# Patient Record
Sex: Female | Born: 1985 | Race: Black or African American | Hispanic: No | Smoking: Current every day smoker
Health system: Southern US, Community
[De-identification: ages and names within clinical notes are randomized; demographics above are authoritative.]

## PROBLEM LIST (undated history)

## (undated) ENCOUNTER — Inpatient Hospital Stay (HOSPITAL_COMMUNITY): Payer: Self-pay

## (undated) DIAGNOSIS — M1990 Unspecified osteoarthritis, unspecified site: Secondary | ICD-10-CM

## (undated) DIAGNOSIS — N390 Urinary tract infection, site not specified: Secondary | ICD-10-CM

## (undated) DIAGNOSIS — H209 Unspecified iridocyclitis: Secondary | ICD-10-CM

## (undated) DIAGNOSIS — F32A Depression, unspecified: Secondary | ICD-10-CM

## (undated) DIAGNOSIS — F419 Anxiety disorder, unspecified: Secondary | ICD-10-CM

## (undated) DIAGNOSIS — I10 Essential (primary) hypertension: Secondary | ICD-10-CM

## (undated) DIAGNOSIS — F329 Major depressive disorder, single episode, unspecified: Secondary | ICD-10-CM

## (undated) DIAGNOSIS — Z789 Other specified health status: Secondary | ICD-10-CM

## (undated) DIAGNOSIS — J45909 Unspecified asthma, uncomplicated: Secondary | ICD-10-CM

## (undated) DIAGNOSIS — E119 Type 2 diabetes mellitus without complications: Secondary | ICD-10-CM

## (undated) HISTORY — PX: PILONIDAL CYST EXCISION: SHX744

## (undated) HISTORY — PX: TUBAL LIGATION: SHX77

## (undated) HISTORY — PX: WISDOM TOOTH EXTRACTION: SHX21

## (undated) HISTORY — DX: Unspecified iridocyclitis: H20.9

## (undated) HISTORY — DX: Anxiety disorder, unspecified: F41.9

## (undated) HISTORY — DX: Major depressive disorder, single episode, unspecified: F32.9

## (undated) HISTORY — DX: Essential (primary) hypertension: I10

## (undated) HISTORY — DX: Other specified health status: Z78.9

## (undated) HISTORY — PX: CHOLECYSTECTOMY: SHX55

## (undated) HISTORY — DX: Depression, unspecified: F32.A

## (undated) HISTORY — PX: CYSTECTOMY: SUR359

## (undated) HISTORY — PX: EYE SURGERY: SHX253

---

## 2004-01-11 ENCOUNTER — Emergency Department (HOSPITAL_COMMUNITY): Admission: EM | Admit: 2004-01-11 | Discharge: 2004-01-12 | Payer: Self-pay | Admitting: Emergency Medicine

## 2005-04-21 ENCOUNTER — Emergency Department (HOSPITAL_COMMUNITY): Admission: EM | Admit: 2005-04-21 | Discharge: 2005-04-21 | Payer: Self-pay | Admitting: Emergency Medicine

## 2005-06-17 ENCOUNTER — Inpatient Hospital Stay (HOSPITAL_COMMUNITY): Admission: AD | Admit: 2005-06-17 | Discharge: 2005-06-17 | Payer: Self-pay | Admitting: *Deleted

## 2005-07-05 ENCOUNTER — Ambulatory Visit (HOSPITAL_COMMUNITY): Admission: RE | Admit: 2005-07-05 | Discharge: 2005-07-05 | Payer: Self-pay | Admitting: *Deleted

## 2005-07-30 ENCOUNTER — Inpatient Hospital Stay (HOSPITAL_COMMUNITY): Admission: AD | Admit: 2005-07-30 | Discharge: 2005-07-30 | Payer: Self-pay | Admitting: Obstetrics and Gynecology

## 2005-08-02 ENCOUNTER — Ambulatory Visit (HOSPITAL_COMMUNITY): Admission: RE | Admit: 2005-08-02 | Discharge: 2005-08-02 | Payer: Self-pay | Admitting: *Deleted

## 2005-08-19 ENCOUNTER — Ambulatory Visit: Payer: Self-pay | Admitting: *Deleted

## 2005-08-19 ENCOUNTER — Inpatient Hospital Stay (HOSPITAL_COMMUNITY): Admission: AD | Admit: 2005-08-19 | Discharge: 2005-08-19 | Payer: Self-pay | Admitting: Obstetrics & Gynecology

## 2005-11-05 ENCOUNTER — Inpatient Hospital Stay (HOSPITAL_COMMUNITY): Admission: AD | Admit: 2005-11-05 | Discharge: 2005-11-05 | Payer: Self-pay | Admitting: Family Medicine

## 2005-12-21 ENCOUNTER — Inpatient Hospital Stay (HOSPITAL_COMMUNITY): Admission: AD | Admit: 2005-12-21 | Discharge: 2005-12-21 | Payer: Self-pay | Admitting: Obstetrics & Gynecology

## 2006-01-01 ENCOUNTER — Inpatient Hospital Stay (HOSPITAL_COMMUNITY): Admission: AD | Admit: 2006-01-01 | Discharge: 2006-01-04 | Payer: Self-pay | Admitting: Obstetrics

## 2006-01-02 ENCOUNTER — Encounter (INDEPENDENT_AMBULATORY_CARE_PROVIDER_SITE_OTHER): Payer: Self-pay | Admitting: Specialist

## 2007-02-03 ENCOUNTER — Emergency Department (HOSPITAL_COMMUNITY): Admission: EM | Admit: 2007-02-03 | Discharge: 2007-02-03 | Payer: Self-pay | Admitting: Family Medicine

## 2007-02-06 ENCOUNTER — Emergency Department (HOSPITAL_COMMUNITY): Admission: EM | Admit: 2007-02-06 | Discharge: 2007-02-06 | Payer: Self-pay | Admitting: Emergency Medicine

## 2007-03-24 ENCOUNTER — Emergency Department (HOSPITAL_COMMUNITY): Admission: EM | Admit: 2007-03-24 | Discharge: 2007-03-24 | Payer: Self-pay | Admitting: Emergency Medicine

## 2007-03-27 ENCOUNTER — Emergency Department (HOSPITAL_COMMUNITY): Admission: EM | Admit: 2007-03-27 | Discharge: 2007-03-27 | Payer: Self-pay | Admitting: Emergency Medicine

## 2007-10-05 ENCOUNTER — Emergency Department (HOSPITAL_COMMUNITY): Admission: EM | Admit: 2007-10-05 | Discharge: 2007-10-05 | Payer: Self-pay | Admitting: Family Medicine

## 2007-10-07 ENCOUNTER — Emergency Department (HOSPITAL_COMMUNITY): Admission: EM | Admit: 2007-10-07 | Discharge: 2007-10-07 | Payer: Self-pay | Admitting: Emergency Medicine

## 2007-10-24 ENCOUNTER — Ambulatory Visit (HOSPITAL_BASED_OUTPATIENT_CLINIC_OR_DEPARTMENT_OTHER): Admission: RE | Admit: 2007-10-24 | Discharge: 2007-10-24 | Payer: Self-pay | Admitting: Surgery

## 2007-10-24 ENCOUNTER — Encounter (INDEPENDENT_AMBULATORY_CARE_PROVIDER_SITE_OTHER): Payer: Self-pay | Admitting: Surgery

## 2007-10-26 ENCOUNTER — Emergency Department (HOSPITAL_COMMUNITY): Admission: EM | Admit: 2007-10-26 | Discharge: 2007-10-26 | Payer: Self-pay | Admitting: Emergency Medicine

## 2008-03-05 ENCOUNTER — Emergency Department (HOSPITAL_COMMUNITY): Admission: EM | Admit: 2008-03-05 | Discharge: 2008-03-05 | Payer: Self-pay | Admitting: Family Medicine

## 2009-03-15 ENCOUNTER — Inpatient Hospital Stay (HOSPITAL_COMMUNITY): Admission: AD | Admit: 2009-03-15 | Discharge: 2009-03-15 | Payer: Self-pay | Admitting: Obstetrics & Gynecology

## 2009-04-16 ENCOUNTER — Inpatient Hospital Stay (HOSPITAL_COMMUNITY): Admission: AD | Admit: 2009-04-16 | Discharge: 2009-04-16 | Payer: Self-pay | Admitting: Obstetrics

## 2009-05-29 ENCOUNTER — Emergency Department (HOSPITAL_COMMUNITY): Admission: EM | Admit: 2009-05-29 | Discharge: 2009-05-29 | Payer: Self-pay | Admitting: Emergency Medicine

## 2009-06-02 ENCOUNTER — Ambulatory Visit (HOSPITAL_COMMUNITY): Admission: RE | Admit: 2009-06-02 | Discharge: 2009-06-02 | Payer: Self-pay | Admitting: Obstetrics

## 2009-06-18 ENCOUNTER — Inpatient Hospital Stay (HOSPITAL_COMMUNITY): Admission: AD | Admit: 2009-06-18 | Discharge: 2009-06-18 | Payer: Self-pay | Admitting: Obstetrics

## 2009-07-25 ENCOUNTER — Inpatient Hospital Stay (HOSPITAL_COMMUNITY): Admission: AD | Admit: 2009-07-25 | Discharge: 2009-07-25 | Payer: Self-pay | Admitting: Obstetrics

## 2009-09-14 ENCOUNTER — Emergency Department (HOSPITAL_COMMUNITY): Admission: EM | Admit: 2009-09-14 | Discharge: 2009-09-14 | Payer: Self-pay | Admitting: Family Medicine

## 2009-10-20 ENCOUNTER — Inpatient Hospital Stay (HOSPITAL_COMMUNITY): Admission: AD | Admit: 2009-10-20 | Discharge: 2009-10-20 | Payer: Self-pay | Admitting: Obstetrics & Gynecology

## 2009-10-29 ENCOUNTER — Inpatient Hospital Stay (HOSPITAL_COMMUNITY): Admission: RE | Admit: 2009-10-29 | Discharge: 2009-10-31 | Payer: Self-pay | Admitting: Obstetrics

## 2009-11-09 ENCOUNTER — Inpatient Hospital Stay (HOSPITAL_COMMUNITY): Admission: AD | Admit: 2009-11-09 | Discharge: 2009-11-10 | Payer: Self-pay | Admitting: Family Medicine

## 2009-11-14 ENCOUNTER — Emergency Department (HOSPITAL_COMMUNITY): Admission: EM | Admit: 2009-11-14 | Discharge: 2009-11-14 | Payer: Self-pay | Admitting: Internal Medicine

## 2009-11-14 ENCOUNTER — Emergency Department (HOSPITAL_COMMUNITY): Admission: EM | Admit: 2009-11-14 | Discharge: 2009-11-14 | Payer: Self-pay | Admitting: Emergency Medicine

## 2010-03-04 ENCOUNTER — Emergency Department (HOSPITAL_COMMUNITY): Admission: EM | Admit: 2010-03-04 | Discharge: 2010-03-04 | Payer: Self-pay | Admitting: Emergency Medicine

## 2010-03-08 ENCOUNTER — Emergency Department (HOSPITAL_COMMUNITY): Admission: EM | Admit: 2010-03-08 | Discharge: 2010-03-08 | Payer: Self-pay | Admitting: Emergency Medicine

## 2010-04-21 ENCOUNTER — Ambulatory Visit (HOSPITAL_COMMUNITY): Admission: RE | Admit: 2010-04-21 | Discharge: 2010-04-21 | Payer: Self-pay | Admitting: Surgery

## 2010-05-27 ENCOUNTER — Encounter: Admission: RE | Admit: 2010-05-27 | Discharge: 2010-05-27 | Payer: Self-pay | Admitting: Surgery

## 2010-08-08 ENCOUNTER — Encounter: Payer: Self-pay | Admitting: Obstetrics

## 2010-09-23 ENCOUNTER — Emergency Department (HOSPITAL_COMMUNITY)
Admission: EM | Admit: 2010-09-23 | Discharge: 2010-09-23 | Disposition: A | Discharge status: Home / Self Care | Payer: Medicaid Other | Attending: Emergency Medicine | Admitting: Emergency Medicine

## 2010-09-23 DIAGNOSIS — L0501 Pilonidal cyst with abscess: Secondary | ICD-10-CM | POA: Insufficient documentation

## 2010-09-30 LAB — SURGICAL PCR SCREEN
MRSA, PCR: NEGATIVE
Staphylococcus aureus: NEGATIVE

## 2010-09-30 LAB — CBC
HCT: 37.4 % (ref 36.0–46.0)
Hemoglobin: 12.7 g/dL (ref 12.0–15.0)
MCH: 30.2 pg (ref 26.0–34.0)
MCHC: 34 g/dL (ref 30.0–36.0)
MCV: 89 fL (ref 78.0–100.0)
Platelets: 328 10*3/uL (ref 150–400)
RBC: 4.2 MIL/uL (ref 3.87–5.11)
RDW: 13.6 % (ref 11.5–15.5)
WBC: 11.7 10*3/uL — ABNORMAL HIGH (ref 4.0–10.5)

## 2010-10-03 ENCOUNTER — Emergency Department (HOSPITAL_COMMUNITY)
Admission: EM | Admit: 2010-10-03 | Discharge: 2010-10-04 | Disposition: A | Discharge status: Home / Self Care | Payer: Medicaid Other | Attending: Emergency Medicine | Admitting: Emergency Medicine

## 2010-10-03 DIAGNOSIS — R109 Unspecified abdominal pain: Secondary | ICD-10-CM | POA: Insufficient documentation

## 2010-10-03 DIAGNOSIS — K5289 Other specified noninfective gastroenteritis and colitis: Secondary | ICD-10-CM | POA: Insufficient documentation

## 2010-10-03 DIAGNOSIS — R197 Diarrhea, unspecified: Secondary | ICD-10-CM | POA: Insufficient documentation

## 2010-10-03 DIAGNOSIS — R10819 Abdominal tenderness, unspecified site: Secondary | ICD-10-CM | POA: Insufficient documentation

## 2010-10-03 DIAGNOSIS — E860 Dehydration: Secondary | ICD-10-CM | POA: Insufficient documentation

## 2010-10-03 DIAGNOSIS — R112 Nausea with vomiting, unspecified: Secondary | ICD-10-CM | POA: Insufficient documentation

## 2010-10-03 LAB — DIFFERENTIAL
Basophils Absolute: 0 10*3/uL (ref 0.0–0.1)
Basophils Relative: 0 % (ref 0–1)
Eosinophils Absolute: 0.2 10*3/uL (ref 0.0–0.7)
Eosinophils Relative: 2 % (ref 0–5)
Lymphocytes Relative: 19 % (ref 12–46)
Lymphs Abs: 1.9 10*3/uL (ref 0.7–4.0)
Monocytes Absolute: 0.9 10*3/uL (ref 0.1–1.0)
Monocytes Relative: 9 % (ref 3–12)
Neutro Abs: 7.1 10*3/uL (ref 1.7–7.7)
Neutrophils Relative %: 70 % (ref 43–77)

## 2010-10-03 LAB — URINALYSIS, ROUTINE W REFLEX MICROSCOPIC
Bilirubin Urine: NEGATIVE
Glucose, UA: NEGATIVE mg/dL
Hgb urine dipstick: NEGATIVE
Ketones, ur: NEGATIVE mg/dL
Nitrite: NEGATIVE
Protein, ur: NEGATIVE mg/dL
Specific Gravity, Urine: 1.015 (ref 1.005–1.030)
Urobilinogen, UA: 0.2 mg/dL (ref 0.0–1.0)
pH: 7.5 (ref 5.0–8.0)

## 2010-10-03 LAB — GC/CHLAMYDIA PROBE AMP, GENITAL
Chlamydia, DNA Probe: NEGATIVE
GC Probe Amp, Genital: NEGATIVE

## 2010-10-03 LAB — BASIC METABOLIC PANEL
BUN: 6 mg/dL (ref 6–23)
CO2: 23 mEq/L (ref 19–32)
Calcium: 8 mg/dL — ABNORMAL LOW (ref 8.4–10.5)
Chloride: 107 mEq/L (ref 96–112)
Creatinine, Ser: 0.62 mg/dL (ref 0.4–1.2)
GFR calc Af Amer: >60 mL/min (ref >60)
GFR calc non Af Amer: >60 mL/min (ref >60)
Glucose, Bld: 97 mg/dL (ref 70–99)
Potassium: 3.1 mEq/L — ABNORMAL LOW (ref 3.5–5.1)
Sodium: 134 mEq/L — ABNORMAL LOW (ref 135–145)

## 2010-10-03 LAB — CBC
HCT: 36.7 % (ref 36.0–46.0)
Hemoglobin: 12.6 g/dL (ref 12.0–15.0)
MCH: 30.4 pg (ref 26.0–34.0)
MCHC: 34.3 g/dL (ref 30.0–36.0)
MCV: 88.4 fL (ref 78.0–100.0)
Platelets: 294 10*3/uL (ref 150–400)
RBC: 4.15 MIL/uL (ref 3.87–5.11)
RDW: 13.9 % (ref 11.5–15.5)
WBC: 10.1 10*3/uL (ref 4.0–10.5)

## 2010-10-03 LAB — WET PREP, GENITAL
Trich, Wet Prep: NONE SEEN
Yeast Wet Prep HPF POC: NONE SEEN

## 2010-10-03 LAB — FETAL FIBRONECTIN: Fetal Fibronectin: NEGATIVE

## 2010-10-05 LAB — AMYLASE: Amylase: 51 U/L (ref 0–105)

## 2010-10-05 LAB — CBC
HCT: 27.4 % — ABNORMAL LOW (ref 36.0–46.0)
HCT: 38.2 % (ref 36.0–46.0)
Hemoglobin: 12.6 g/dL (ref 12.0–15.0)
Hemoglobin: 9.2 g/dL — ABNORMAL LOW (ref 12.0–15.0)
MCHC: 32.9 g/dL (ref 30.0–36.0)
MCHC: 33.5 g/dL (ref 30.0–36.0)
MCV: 89.8 fL (ref 78.0–100.0)
MCV: 91.5 fL (ref 78.0–100.0)
Platelets: 166 10*3/uL (ref 150–400)
Platelets: 349 10*3/uL (ref 150–400)
RBC: 3 MIL/uL — ABNORMAL LOW (ref 3.87–5.11)
RBC: 4.25 MIL/uL (ref 3.87–5.11)
RDW: 14.2 % (ref 11.5–15.5)
RDW: 14.6 % (ref 11.5–15.5)
WBC: 14.4 10*3/uL — ABNORMAL HIGH (ref 4.0–10.5)
WBC: 15.8 10*3/uL — ABNORMAL HIGH (ref 4.0–10.5)

## 2010-10-05 LAB — DIFFERENTIAL
Basophils Absolute: 0.1 10*3/uL (ref 0.0–0.1)
Basophils Relative: 0 % (ref 0–1)
Eosinophils Absolute: 0.1 10*3/uL (ref 0.0–0.7)
Eosinophils Relative: 1 % (ref 0–5)
Lymphocytes Relative: 17 % (ref 12–46)
Lymphs Abs: 2.4 10*3/uL (ref 0.7–4.0)
Monocytes Absolute: 0.8 10*3/uL (ref 0.1–1.0)
Monocytes Relative: 6 % (ref 3–12)
Neutro Abs: 11 10*3/uL — ABNORMAL HIGH (ref 1.7–7.7)
Neutrophils Relative %: 76 % (ref 43–77)

## 2010-10-05 LAB — COMPREHENSIVE METABOLIC PANEL
ALT: 15 U/L (ref 0–35)
AST: 23 U/L (ref 0–37)
Albumin: 3.8 g/dL (ref 3.5–5.2)
Alkaline Phosphatase: 113 U/L (ref 39–117)
BUN: 8 mg/dL (ref 6–23)
CO2: 24 mEq/L (ref 19–32)
Calcium: 9.4 mg/dL (ref 8.4–10.5)
Chloride: 104 mEq/L (ref 96–112)
Creatinine, Ser: 0.68 mg/dL (ref 0.4–1.2)
GFR calc Af Amer: >60 mL/min (ref >60)
GFR calc non Af Amer: >60 mL/min (ref >60)
Glucose, Bld: 99 mg/dL (ref 70–99)
Potassium: 3.4 mEq/L — ABNORMAL LOW (ref 3.5–5.1)
Sodium: 139 mEq/L (ref 135–145)
Total Bilirubin: 0.6 mg/dL (ref 0.3–1.2)
Total Protein: 7.3 g/dL (ref 6.0–8.3)

## 2010-10-05 LAB — URINALYSIS, ROUTINE W REFLEX MICROSCOPIC
Bilirubin Urine: NEGATIVE
Glucose, UA: NEGATIVE mg/dL
Hgb urine dipstick: NEGATIVE
Ketones, ur: NEGATIVE mg/dL
Nitrite: NEGATIVE
Protein, ur: NEGATIVE mg/dL
Specific Gravity, Urine: <1.005 — ABNORMAL LOW (ref 1.005–1.030)
Urobilinogen, UA: 0.2 mg/dL (ref 0.0–1.0)
pH: 7 (ref 5.0–8.0)

## 2010-10-05 LAB — TROPONIN I: Troponin I: 0.03 ng/mL (ref 0.00–0.06)

## 2010-10-05 LAB — URIC ACID: Uric Acid, Serum: 5.9 mg/dL (ref 2.4–7.0)

## 2010-10-05 LAB — LIPASE, BLOOD: Lipase: 23 U/L (ref 11–59)

## 2010-10-05 LAB — LACTATE DEHYDROGENASE: LDH: 251 U/L — ABNORMAL HIGH (ref 94–250)

## 2010-10-06 LAB — CBC
HCT: 31.2 % — ABNORMAL LOW (ref 36.0–46.0)
Hemoglobin: 10.4 g/dL — ABNORMAL LOW (ref 12.0–15.0)
MCHC: 33.5 g/dL (ref 30.0–36.0)
MCV: 90.8 fL (ref 78.0–100.0)
Platelets: 203 10*3/uL (ref 150–400)
RBC: 3.43 MIL/uL — ABNORMAL LOW (ref 3.87–5.11)
RDW: 14.5 % (ref 11.5–15.5)
WBC: 12.5 10*3/uL — ABNORMAL HIGH (ref 4.0–10.5)

## 2010-10-06 LAB — RPR: RPR Ser Ql: NONREACTIVE

## 2010-10-20 LAB — POCT RAPID STREP A (OFFICE): Streptococcus, Group A Screen (Direct): POSITIVE — AB

## 2010-10-22 LAB — CBC
HCT: 34.1 % — ABNORMAL LOW (ref 36.0–46.0)
Hemoglobin: 11.6 g/dL — ABNORMAL LOW (ref 12.0–15.0)
MCHC: 33.8 g/dL (ref 30.0–36.0)
MCV: 93 fL (ref 78.0–100.0)
Platelets: 280 10*3/uL (ref 150–400)
RBC: 3.67 MIL/uL — ABNORMAL LOW (ref 3.87–5.11)
RDW: 13.4 % (ref 11.5–15.5)
WBC: 12.5 10*3/uL — ABNORMAL HIGH (ref 4.0–10.5)

## 2010-10-22 LAB — BASIC METABOLIC PANEL
BUN: 4 mg/dL — ABNORMAL LOW (ref 6–23)
CO2: 22 mEq/L (ref 19–32)
Calcium: 9.1 mg/dL (ref 8.4–10.5)
Chloride: 104 mEq/L (ref 96–112)
Creatinine, Ser: 0.54 mg/dL (ref 0.4–1.2)
GFR calc Af Amer: >60 mL/min (ref >60)
GFR calc non Af Amer: >60 mL/min (ref >60)
Glucose, Bld: 119 mg/dL — ABNORMAL HIGH (ref 70–99)
Potassium: 3.4 mEq/L — ABNORMAL LOW (ref 3.5–5.1)
Sodium: 134 mEq/L — ABNORMAL LOW (ref 135–145)

## 2010-10-22 LAB — DIFFERENTIAL
Basophils Absolute: 0 10*3/uL (ref 0.0–0.1)
Basophils Relative: 0 % (ref 0–1)
Eosinophils Absolute: 0.1 10*3/uL (ref 0.0–0.7)
Eosinophils Relative: 1 % (ref 0–5)
Lymphocytes Relative: 21 % (ref 12–46)
Lymphs Abs: 2.6 10*3/uL (ref 0.7–4.0)
Monocytes Absolute: 0.8 10*3/uL (ref 0.1–1.0)
Monocytes Relative: 7 % (ref 3–12)
Neutro Abs: 8.9 10*3/uL — ABNORMAL HIGH (ref 1.7–7.7)
Neutrophils Relative %: 71 % (ref 43–77)

## 2010-10-23 LAB — URINALYSIS, ROUTINE W REFLEX MICROSCOPIC
Bilirubin Urine: NEGATIVE
Glucose, UA: NEGATIVE mg/dL
Hgb urine dipstick: NEGATIVE
Ketones, ur: NEGATIVE mg/dL
Nitrite: NEGATIVE
Protein, ur: NEGATIVE mg/dL
Specific Gravity, Urine: 1.01 (ref 1.005–1.030)
Urobilinogen, UA: 0.2 mg/dL (ref 0.0–1.0)
pH: 7.5 (ref 5.0–8.0)

## 2010-10-23 LAB — CBC
HCT: 36.5 % (ref 36.0–46.0)
Hemoglobin: 12.2 g/dL (ref 12.0–15.0)
MCHC: 33.5 g/dL (ref 30.0–36.0)
MCV: 93.9 fL (ref 78.0–100.0)
Platelets: 301 10*3/uL (ref 150–400)
RBC: 3.89 MIL/uL (ref 3.87–5.11)
RDW: 13.4 % (ref 11.5–15.5)
WBC: 13.7 10*3/uL — ABNORMAL HIGH (ref 4.0–10.5)

## 2010-10-23 LAB — COMPREHENSIVE METABOLIC PANEL
ALT: 15 U/L (ref 0–35)
AST: 20 U/L (ref 0–37)
Albumin: 3.8 g/dL (ref 3.5–5.2)
Alkaline Phosphatase: 67 U/L (ref 39–117)
BUN: 4 mg/dL — ABNORMAL LOW (ref 6–23)
CO2: 23 mEq/L (ref 19–32)
Calcium: 8.7 mg/dL (ref 8.4–10.5)
Chloride: 103 mEq/L (ref 96–112)
Creatinine, Ser: 0.64 mg/dL (ref 0.4–1.2)
GFR calc Af Amer: >60 mL/min (ref >60)
GFR calc non Af Amer: >60 mL/min (ref >60)
Glucose, Bld: 97 mg/dL (ref 70–99)
Potassium: 3.6 mEq/L (ref 3.5–5.1)
Sodium: 134 mEq/L — ABNORMAL LOW (ref 135–145)
Total Bilirubin: 0.4 mg/dL (ref 0.3–1.2)
Total Protein: 7.3 g/dL (ref 6.0–8.3)

## 2010-11-04 ENCOUNTER — Other Ambulatory Visit: Payer: Self-pay | Admitting: Surgery

## 2010-11-04 DIAGNOSIS — M533 Sacrococcygeal disorders, not elsewhere classified: Secondary | ICD-10-CM

## 2010-11-08 ENCOUNTER — Other Ambulatory Visit: Payer: Medicaid Other

## 2010-11-11 ENCOUNTER — Ambulatory Visit
Admission: RE | Admit: 2010-11-11 | Discharge: 2010-11-11 | Disposition: A | Discharge status: Home / Self Care | Payer: Medicaid Other | Source: Ambulatory Visit | Attending: Surgery | Admitting: Surgery

## 2010-11-11 DIAGNOSIS — M533 Sacrococcygeal disorders, not elsewhere classified: Secondary | ICD-10-CM

## 2010-11-11 MED ORDER — GADOBENATE DIMEGLUMINE 529 MG/ML IV SOLN
20.0000 mL | Freq: Once | INTRAVENOUS | Status: AC | PRN
  Administered 2010-11-11: 20 mL via INTRAVENOUS

## 2010-11-17 ENCOUNTER — Encounter (HOSPITAL_COMMUNITY)
Admission: RE | Admit: 2010-11-17 | Discharge: 2010-11-17 | Disposition: A | Discharge status: Home / Self Care | Payer: Medicaid Other | Source: Ambulatory Visit | Attending: Surgery | Admitting: Surgery

## 2010-11-17 ENCOUNTER — Other Ambulatory Visit (HOSPITAL_COMMUNITY): Payer: Self-pay | Admitting: Surgery

## 2010-11-17 ENCOUNTER — Ambulatory Visit (HOSPITAL_COMMUNITY)
Admission: RE | Admit: 2010-11-17 | Discharge: 2010-11-17 | Disposition: A | Discharge status: Home / Self Care | Payer: Medicaid Other | Source: Ambulatory Visit | Attending: Surgery | Admitting: Surgery

## 2010-11-17 DIAGNOSIS — Z01812 Encounter for preprocedural laboratory examination: Secondary | ICD-10-CM | POA: Insufficient documentation

## 2010-11-17 DIAGNOSIS — L0501 Pilonidal cyst with abscess: Secondary | ICD-10-CM

## 2010-11-17 DIAGNOSIS — Z01810 Encounter for preprocedural cardiovascular examination: Secondary | ICD-10-CM | POA: Insufficient documentation

## 2010-11-17 DIAGNOSIS — Z01818 Encounter for other preprocedural examination: Secondary | ICD-10-CM | POA: Insufficient documentation

## 2010-11-17 LAB — BASIC METABOLIC PANEL
BUN: 8 mg/dL (ref 6–23)
CO2: 27 mEq/L (ref 19–32)
Calcium: 9.8 mg/dL (ref 8.4–10.5)
Chloride: 102 mEq/L (ref 96–112)
Creatinine, Ser: 0.62 mg/dL (ref 0.4–1.2)
GFR calc Af Amer: >60 mL/min (ref >60)
GFR calc non Af Amer: >60 mL/min (ref >60)
Glucose, Bld: 96 mg/dL (ref 70–99)
Potassium: 4 mEq/L (ref 3.5–5.1)
Sodium: 137 mEq/L (ref 135–145)

## 2010-11-17 LAB — CBC
HCT: 39.1 % (ref 36.0–46.0)
Hemoglobin: 13.3 g/dL (ref 12.0–15.0)
MCH: 29.8 pg (ref 26.0–34.0)
MCHC: 34 g/dL (ref 30.0–36.0)
MCV: 87.7 fL (ref 78.0–100.0)
Platelets: 334 10*3/uL (ref 150–400)
RBC: 4.46 MIL/uL (ref 3.87–5.11)
RDW: 13.4 % (ref 11.5–15.5)
WBC: 10.1 10*3/uL (ref 4.0–10.5)

## 2010-11-17 LAB — SURGICAL PCR SCREEN
MRSA, PCR: NEGATIVE
Staphylococcus aureus: NEGATIVE

## 2010-11-17 LAB — HCG, SERUM, QUALITATIVE: Preg, Serum: NEGATIVE

## 2010-11-18 ENCOUNTER — Other Ambulatory Visit: Payer: Self-pay | Admitting: Surgery

## 2010-11-18 ENCOUNTER — Ambulatory Visit (HOSPITAL_COMMUNITY)
Admission: RE | Admit: 2010-11-18 | Discharge: 2010-11-18 | Disposition: A | Discharge status: Home / Self Care | Payer: Medicaid Other | Source: Ambulatory Visit | Attending: Surgery | Admitting: Surgery

## 2010-11-18 DIAGNOSIS — Z01818 Encounter for other preprocedural examination: Secondary | ICD-10-CM | POA: Insufficient documentation

## 2010-11-18 DIAGNOSIS — Z01812 Encounter for preprocedural laboratory examination: Secondary | ICD-10-CM | POA: Insufficient documentation

## 2010-11-18 DIAGNOSIS — L0501 Pilonidal cyst with abscess: Secondary | ICD-10-CM | POA: Insufficient documentation

## 2010-11-19 NOTE — Op Note (Signed)
  Stacy [name], Stacy [name]             ACCOUNT NO.:  [id number]  MEDICAL RECORD NO.:  [id number]           PATIENT TYPE:  O  LOCATION:  XRAY                         FACILITY:  [facility]  PHYSICIAN:  Wilmon Arms. Corliss Skains, M.D. DATE OF BIRTH:  October 16, 1985  DATE OF PROCEDURE:  11/18/2010 DATE OF DISCHARGE:  11/17/2010                              OPERATIVE REPORT   PREOPERATIVE DIAGNOSIS:  Recurrent pilonidal abscess.  POSTOPERATIVE DIAGNOSIS:  Recurrent pilonidal abscess.  PROCEDURE:  Pilonidal cystectomy.  SURGEON:  Wilmon Arms. Corliss Skains, MD  ANESTHESIA:  General.  INDICATIONS:  This is a healthy 25 year old female who first underwent a pilonidal cystectomy in 2009 for recurrent pilonidal abscess.  She healed well from that and was doing fine until she had pregnancy in 2010.  After delivery she developed a mass in that area and began having more pain.  In October, she underwent an excision of this area.  At that time we closed her wound in several layers.  Initially she healed up well but since that time has developed severe pain in this area.  It is a question whether she has a neuroma or some scar tissue that has resulted in severe pain.  We did have an MRI recently which showed that all the inflammation is in the subcutaneous fat.  There were no bony findings.  She presents now for wide excision of this area with the plan to leave this area open to let it heal by secondary intention.  DESCRIPTION OF PROCEDURE:  The patient was brought to the operating room and placed in supine position on the stretcher.  After adequate level of general anesthesia was obtained, she was flipped over prone position and her buttocks were taped apart.  We prepped this area with Betadine and draped in sterile fashion.  Time-out was taken to assure proper patient, proper procedure.  We infiltrated the area around her previous pilonidal excision site with 0.25% Marcaine with epinephrine.  I could see a few midline  pilonidal pits.  She also has a lot of firm scar tissue around her old incision.  We made a wide elliptical incision to excise all of her old scar as well as the midline pits.  We carried our dissection wide around this area using cautery.  We dissected out into a normal- appearing fat.  We excised all the scar tissue.  We did not have to go all the way down to the sacral fascia.  We sent this for pathologic examination.  Hemostasis was obtained with cautery.  We irrigated this thoroughly and packed the wound with saline-soaked gauze.  The patient was then extubated and brought to recovery room in stable condition. All sponge, instrument, and needle counts were correct.     Wilmon Arms. Corliss Skains, M.D.     MKT/MEDQ  D:  11/18/2010  T:  11/18/2010  Job:  [id number]  Electronically Signed by Manus Rudd M.D. on 11/19/2010 02:15:13 PM

## 2010-11-20 ENCOUNTER — Inpatient Hospital Stay (INDEPENDENT_AMBULATORY_CARE_PROVIDER_SITE_OTHER)
Admission: RE | Admit: 2010-11-20 | Discharge: 2010-11-20 | Disposition: A | Discharge status: Home / Self Care | Payer: Medicaid Other | Source: Ambulatory Visit | Attending: Family Medicine | Admitting: Family Medicine

## 2010-11-20 DIAGNOSIS — Z9889 Other specified postprocedural states: Secondary | ICD-10-CM

## 2010-11-24 ENCOUNTER — Emergency Department (HOSPITAL_COMMUNITY)
Admission: EM | Admit: 2010-11-24 | Discharge: 2010-11-24 | Disposition: A | Discharge status: Home / Self Care | Payer: Medicaid Other | Attending: Emergency Medicine | Admitting: Emergency Medicine

## 2010-11-24 DIAGNOSIS — Z4800 Encounter for change or removal of nonsurgical wound dressing: Secondary | ICD-10-CM | POA: Insufficient documentation

## 2010-11-24 DIAGNOSIS — L0501 Pilonidal cyst with abscess: Secondary | ICD-10-CM | POA: Insufficient documentation

## 2010-11-24 DIAGNOSIS — Z09 Encounter for follow-up examination after completed treatment for conditions other than malignant neoplasm: Secondary | ICD-10-CM | POA: Insufficient documentation

## 2010-11-30 NOTE — Op Note (Signed)
[name], [name]             ACCOUNT NO.:  [id number]   MEDICAL RECORD NO.:  [id number]          PATIENT TYPE:  AMB   LOCATION:  DSC                          FACILITY:  [facility]   PHYSICIAN:  Wilmon Arms. Corliss Skains, M.D. DATE OF BIRTH:  02-13-1986   DATE OF PROCEDURE:  DATE OF DISCHARGE:                               OPERATIVE REPORT   PREOPERATIVE DIAGNOSIS:  Recurrent pilonidal abscess.   POSTOPERATIVE DIAGNOSIS:  Recurrent pilonidal abscess.   PROCEDURE PERFORMED:  Pilonidal cystectomy.   SURGEON:  Wilmon Arms. Tsuei, M.D.,FACS   ANESTHESIA:  General.   INDICATIONS:  The patient is a 25 year old female who presents with  recurrent pilonidal abscesses.  Over the last 18 months, she has had 5  recurrences of the same pilonidal abscess.  Each time, this was drained  at urgent care with just a small opening.  She is referred for surgical  evaluation.  We placed her on antibiotics as the wound was still  indurated, but adequately drained.  The recurrent abscess has shrunk  down to a fairly small size.  She presents now for elective excision of  this area.   DESCRIPTION OF OPERATION:  The patient was brought to the operating room  on the stretcher.  After an adequate level of general anesthesia was  obtained, she was flipped to a prone position on the operating room  table.  Her coccygeal region and upper buttocks were prepped with  Betadine and draped in a sterile fashion.  At that time, I was taken to  the proper patient, proper procedure.  Several small superficial skin  pits were seen.  This region probed with lacrimal duct probe.  The one  closest to the chronic scar tissue on the left communicated with the  abscess cavity.  We anesthetized this entire area with 0.25% Marcaine  with epinephrine .  I outlined an incision around of the chronic scar  tissue.  There was very little induration.  The skin incision was made  in elliptical fashion with scalpel.  Dissection was carried  down on  subcutaneous tissues with cautery.  The entire area of the chronic scar  tissue was excised back to normal-appearing fat.  There was a tunnel  extending medially and anteriorly communicating with the skin tip.  This  entire tunnel was excised.  The wound was inspected for hemostasis.  The  wound was then packed with two 4 x 4 gauze soaked in saline .  She will  have daily dressing changes to this area with home health nursing.  The  patient was then extubated and brought to recovery in stable condition.  All sponge, instrument, and needle counts were correct.      Wilmon Arms. Tsuei, M.D.  Electronically Signed     MKT/MEDQ  D:  10/24/2007  T:  10/25/2007  Job:  [id number]

## 2010-12-03 ENCOUNTER — Emergency Department (HOSPITAL_COMMUNITY)
Admission: EM | Admit: 2010-12-03 | Discharge: 2010-12-03 | Disposition: A | Discharge status: Home / Self Care | Payer: Medicaid Other | Attending: Emergency Medicine | Admitting: Emergency Medicine

## 2010-12-03 ENCOUNTER — Emergency Department (HOSPITAL_COMMUNITY): Payer: Medicaid Other

## 2010-12-03 DIAGNOSIS — R509 Fever, unspecified: Secondary | ICD-10-CM | POA: Insufficient documentation

## 2010-12-03 DIAGNOSIS — G8918 Other acute postprocedural pain: Secondary | ICD-10-CM | POA: Insufficient documentation

## 2010-12-05 LAB — WOUND CULTURE

## 2010-12-09 ENCOUNTER — Encounter (INDEPENDENT_AMBULATORY_CARE_PROVIDER_SITE_OTHER): Payer: Self-pay | Admitting: Surgery

## 2011-02-01 ENCOUNTER — Encounter (INDEPENDENT_AMBULATORY_CARE_PROVIDER_SITE_OTHER): Payer: Self-pay | Admitting: Surgery

## 2011-02-01 ENCOUNTER — Ambulatory Visit (INDEPENDENT_AMBULATORY_CARE_PROVIDER_SITE_OTHER): Payer: Medicaid Other | Admitting: Surgery

## 2011-02-01 VITALS — Temp 98.4°F | Ht 64.0 in | Wt 181.8 lb

## 2011-02-01 DIAGNOSIS — T148XXA Other injury of unspecified body region, initial encounter: Secondary | ICD-10-CM

## 2011-02-01 DIAGNOSIS — L0501 Pilonidal cyst with abscess: Secondary | ICD-10-CM

## 2011-02-01 MED ORDER — HYDROCODONE-ACETAMINOPHEN 5-500 MG PO TABS: 1.0000 | ORAL_TABLET | Freq: Every day | ORAL | Status: DC

## 2011-02-01 NOTE — Progress Notes (Signed)
The patient comes in for a recheck. The wound had healed completely and her pain was completely resolved until last week. She awoke one morning with severe sharp pain at her wound. It has been virtually no drainage from his wound. She does wear a  small pad over this area and she states that there is just a tiny bit of drainage when she changes it each day. There is no increase in the drainage or swelling in this area. She denies any fever.  On examination the wound seems to be almost completely healed. There is a tiny pinhole in the wound but there is no drainage coming from this area. No surrounding erythema. No induration. However with palpation right at the edge of her old scar she has intense pain. She may have developed a neuroma in this area. We prepped this area with alcohol and injected her with 9 cc of 0.25% Marcaine mixed with 1 cc of Kenalog. This seemed to relieve her pain completely. The patient tolerated the procedure. She will call us back if the pain recurs. We may need to reinject this area.

## 2011-02-01 NOTE — Patient Instructions (Signed)
Call us if the pain returns and we need to repeat the injection.

## 2011-02-14 ENCOUNTER — Emergency Department (HOSPITAL_COMMUNITY)
Admission: EM | Admit: 2011-02-14 | Discharge: 2011-02-14 | Disposition: A | Discharge status: Home / Self Care | Payer: Medicaid Other | Attending: Emergency Medicine | Admitting: Emergency Medicine

## 2011-02-14 DIAGNOSIS — L0591 Pilonidal cyst without abscess: Secondary | ICD-10-CM | POA: Insufficient documentation

## 2011-02-19 ENCOUNTER — Emergency Department (HOSPITAL_COMMUNITY): Payer: Medicaid Other

## 2011-02-19 ENCOUNTER — Emergency Department (HOSPITAL_COMMUNITY)
Admission: EM | Admit: 2011-02-19 | Discharge: 2011-02-19 | Disposition: A | Discharge status: Home / Self Care | Payer: Medicaid Other | Attending: Emergency Medicine | Admitting: Emergency Medicine

## 2011-02-19 DIAGNOSIS — L0501 Pilonidal cyst with abscess: Secondary | ICD-10-CM | POA: Insufficient documentation

## 2011-02-19 DIAGNOSIS — IMO0001 Reserved for inherently not codable concepts without codable children: Secondary | ICD-10-CM | POA: Insufficient documentation

## 2011-02-25 ENCOUNTER — Encounter (INDEPENDENT_AMBULATORY_CARE_PROVIDER_SITE_OTHER): Payer: Self-pay | Admitting: Surgery

## 2011-03-15 ENCOUNTER — Encounter (INDEPENDENT_AMBULATORY_CARE_PROVIDER_SITE_OTHER): Payer: Self-pay | Admitting: Surgery

## 2011-03-17 ENCOUNTER — Ambulatory Visit (INDEPENDENT_AMBULATORY_CARE_PROVIDER_SITE_OTHER): Payer: Medicaid Other | Admitting: Surgery

## 2011-03-17 ENCOUNTER — Encounter (INDEPENDENT_AMBULATORY_CARE_PROVIDER_SITE_OTHER): Payer: Self-pay | Admitting: Surgery

## 2011-03-17 VITALS — BP 108/88 | HR 60 | Temp 96.6°F | Ht 64.0 in | Wt 175.5 lb

## 2011-03-17 DIAGNOSIS — Z872 Personal history of diseases of the skin and subcutaneous tissue: Secondary | ICD-10-CM

## 2011-03-17 MED ORDER — HYDROCODONE-ACETAMINOPHEN 5-500 MG PO TABS: 1.0000 | ORAL_TABLET | ORAL | Status: AC | PRN

## 2011-03-17 NOTE — Progress Notes (Signed)
The patient returns today with continued complaints of the intense pain around her scar. She has not had any drainage from her incision. No surrounding erythema or induration. She just has intense pain to palpation in the lower part of her scar. I cannot palpate any unusual masses in this area. There is some firm scar tissue there but this seems softer than last time.  At this point, I am at a loss to explain the patient's continued symptoms. She does not seem to be exhibiting any drug-seeking behavior that would make me question her symptoms. She has been a very compliant patient. At this point, I will refer her to another surgeon in to obtain a second opinion. We will contact her for a referral after we have spoken with the surgeon. I gave her a refill of Vicodin.

## 2011-03-17 NOTE — Patient Instructions (Signed)
We will refer you to a plastic surgeon for evaluation for possible flap or scar revision.  We will call you when the referral is made

## 2011-03-18 ENCOUNTER — Telehealth (INDEPENDENT_AMBULATORY_CARE_PROVIDER_SITE_OTHER): Payer: Self-pay | Admitting: General Surgery

## 2011-03-18 NOTE — Telephone Encounter (Signed)
Faxed notes to Dr Odis Luster

## 2011-04-01 ENCOUNTER — Other Ambulatory Visit (INDEPENDENT_AMBULATORY_CARE_PROVIDER_SITE_OTHER): Payer: Self-pay | Admitting: Surgery

## 2011-04-01 DIAGNOSIS — Z9889 Other specified postprocedural states: Secondary | ICD-10-CM

## 2011-04-01 DIAGNOSIS — L905 Scar conditions and fibrosis of skin: Secondary | ICD-10-CM

## 2011-04-12 LAB — BASIC METABOLIC PANEL
BUN: 6
CO2: 25
Calcium: 9.1
Chloride: 109
Creatinine, Ser: 0.63
GFR calc Af Amer: >60
GFR calc non Af Amer: >60
Glucose, Bld: 106 — ABNORMAL HIGH
Potassium: 4.4
Sodium: 138

## 2011-04-12 LAB — DIFFERENTIAL
Basophils Absolute: 0
Basophils Relative: 1
Eosinophils Absolute: 0.1
Eosinophils Relative: 2
Lymphocytes Relative: 38
Lymphs Abs: 3.4
Monocytes Absolute: 0.5
Monocytes Relative: 6
Neutro Abs: 4.8
Neutrophils Relative %: 54

## 2011-04-12 LAB — CBC
HCT: 37.1
Hemoglobin: 12.6
MCHC: 34.1
MCV: 90
Platelets: 331
RBC: 4.12
RDW: 13.6
WBC: 8.9

## 2011-04-29 LAB — CULTURE, ROUTINE-ABSCESS

## 2011-09-23 ENCOUNTER — Encounter (HOSPITAL_COMMUNITY): Payer: Self-pay | Admitting: Emergency Medicine

## 2011-09-23 ENCOUNTER — Emergency Department (HOSPITAL_COMMUNITY)
Admission: EM | Admit: 2011-09-23 | Discharge: 2011-09-23 | Disposition: A | Discharge status: Home / Self Care | Payer: Medicaid Other | Attending: Emergency Medicine | Admitting: Emergency Medicine

## 2011-09-23 DIAGNOSIS — R51 Headache: Secondary | ICD-10-CM | POA: Insufficient documentation

## 2011-09-23 DIAGNOSIS — R110 Nausea: Secondary | ICD-10-CM | POA: Insufficient documentation

## 2011-09-23 DIAGNOSIS — R42 Dizziness and giddiness: Secondary | ICD-10-CM

## 2011-09-23 DIAGNOSIS — I10 Essential (primary) hypertension: Secondary | ICD-10-CM | POA: Insufficient documentation

## 2011-09-23 DIAGNOSIS — F17200 Nicotine dependence, unspecified, uncomplicated: Secondary | ICD-10-CM | POA: Insufficient documentation

## 2011-09-23 LAB — URINE MICROSCOPIC-ADD ON

## 2011-09-23 LAB — URINALYSIS, ROUTINE W REFLEX MICROSCOPIC
Bilirubin Urine: NEGATIVE
Glucose, UA: NEGATIVE mg/dL
Ketones, ur: NEGATIVE mg/dL
Leukocytes, UA: NEGATIVE
Nitrite: NEGATIVE
Protein, ur: NEGATIVE mg/dL
Specific Gravity, Urine: 1.027 (ref 1.005–1.030)
Urobilinogen, UA: 1 mg/dL (ref 0.0–1.0)
pH: 7.5 (ref 5.0–8.0)

## 2011-09-23 LAB — POCT PREGNANCY, URINE: Preg Test, Ur: NEGATIVE

## 2011-09-23 MED ORDER — IBUPROFEN 800 MG PO TABS: 800.0000 mg | ORAL_TABLET | Freq: Three times a day (TID) | ORAL | Status: DC

## 2011-09-23 MED ORDER — KETOROLAC TROMETHAMINE 30 MG/ML IJ SOLN
30.0000 mg | Freq: Once | INTRAMUSCULAR | Status: AC
  Administered 2011-09-23: 30 mg via INTRAMUSCULAR
  Filled 2011-09-23: qty 1

## 2011-09-23 MED ORDER — METOCLOPRAMIDE HCL 5 MG/ML IJ SOLN
10.0000 mg | Freq: Once | INTRAMUSCULAR | Status: AC
  Administered 2011-09-23: 10 mg via INTRAVENOUS
  Filled 2011-09-23: qty 2

## 2011-09-23 MED ORDER — DIPHENHYDRAMINE HCL 50 MG/ML IJ SOLN
12.5000 mg | Freq: Once | INTRAMUSCULAR | Status: AC
  Administered 2011-09-23: 12.5 mg via INTRAVENOUS
  Filled 2011-09-23: qty 1

## 2011-09-23 MED ORDER — ONDANSETRON 4 MG PO TBDP
4.0000 mg | ORAL_TABLET | Freq: Once | ORAL | Status: AC
  Administered 2011-09-23: 4 mg via ORAL
  Filled 2011-09-23: qty 1

## 2011-09-23 MED ORDER — MECLIZINE HCL 50 MG PO TABS: 25.0000 mg | ORAL_TABLET | Freq: Three times a day (TID) | ORAL | Status: DC | PRN

## 2011-09-23 MED ORDER — MORPHINE SULFATE 4 MG/ML IJ SOLN
4.0000 mg | Freq: Once | INTRAMUSCULAR | Status: AC
  Administered 2011-09-23: 4 mg via INTRAVENOUS
  Filled 2011-09-23: qty 1

## 2011-09-23 MED ORDER — SODIUM CHLORIDE 0.9 % IV BOLUS (SEPSIS)
1000.0000 mL | Freq: Once | INTRAVENOUS | Status: AC
  Administered 2011-09-23: 1000 mL via INTRAVENOUS

## 2011-09-23 NOTE — ED Provider Notes (Signed)
Assume patient care from Stacy Dick, NP.  Patient presents with headache without any mention meningismal sign. Plan for symptomatic treatment, obtain urine pregnancy test, and discharge as appropriate.  7:19 AM Pt continues to endorse headache after toradol IM injection.  C/o light and sound sensitivity but denies n/v.  No meningismal sign, Stable normal vitals sign. Neck supple, no lymphadenopathy.  Heart RRR, no MRG.  Lung CTAB, abd soft, nontender.  Will give migraine cocktail and will continue monitoring.    8:41 AM Headache improves with migraine cocktail. Patient request to sleep just little longer prior to discharge.  Fayrene Helper, PA-C 09/26/11 1505

## 2011-09-23 NOTE — ED Notes (Signed)
Pt states her headache started about 2100 last night  Pt states shortly after she became dizzy and light headed so she went to lay down  Pt states when she woke up around 0100 and her whole head hurt, still felt dizzy, is sensative to light and noise

## 2011-09-23 NOTE — ED Provider Notes (Signed)
History     CSN: [id number]  Arrival date & time 09/23/11  0418   First MD Initiated Contact with Patient 09/23/11 0430      No chief complaint on file.   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  She went to bed woke up with a global headache and increased dizziness.  She has not taken any over-the-counter medication for headaches.  She does not normally get headaches.  She denies having recent URI symptoms, sore throat, dysuria.  But she is requesting a pregnancy test despite the fact that she has an Implanon implant.  That has been in place  less than 43 years old in her left upper arm  The history is provided by the patient.    Past Medical History  Diagnosis Date  . Hypertension     Past Surgical History  Procedure Date  . Cystectomy   . Wisdom tooth extraction   . Pilonidal cyst excision     History reviewed. No pertinent family history.  History  Substance Use Topics  . Smoking status: Current Everyday Smoker -- 0.2 packs/day    Types: Cigarettes  . Smokeless tobacco: Not on file   Comment: 4 CIGS A DAY  . Alcohol Use: No    OB History    Grav Para Term Preterm Abortions TAB SAB Ect Mult Living                  Review of Systems  Constitutional: Negative for fever and chills.  HENT: Negative for hearing loss, ear pain, congestion, sore throat, rhinorrhea and neck pain.   Gastrointestinal: Positive for nausea. Negative for vomiting.  Genitourinary: Negative for dysuria and frequency.  Neurological: Positive for dizziness. Negative for weakness.    Allergies  Review of patient's allergies indicates no known allergies.  Home Medications   Current Outpatient Rx  Name Route Sig Dispense Refill  . ETONOGESTREL 68 MG [facility] IMPL Subcutaneous  Inject 1 each into the skin once.    Marland Kitchen ZOLPIDEM TARTRATE 10 MG PO TABS Oral Take 10 mg by mouth at bedtime as needed.    Marland Kitchen HYDROCODONE-ACETAMINOPHEN 5-500 MG PO TABS Oral Take 1 tablet by mouth at bedtime. 50 tablet 0    BP 122/79  Pulse 81  Temp(Src) 98.1 F (36.7 C) (Oral)  Resp 20  SpO2 97%  LMP 07/19/2011  Physical Exam  Constitutional: She is oriented to person, place, and time. She appears well-developed and well-nourished.  HENT:  Head: Normocephalic.  Right Ear: Tympanic membrane and ear canal normal.  Left Ear: Tympanic membrane and ear canal normal.  Mouth/Throat: Uvula is midline and oropharynx is clear and moist.  Eyes: EOM are normal. Pupils are equal, round, and reactive to light.  Neck: Normal range of motion.  Cardiovascular: Normal rate.   Pulmonary/Chest: Effort normal. No respiratory distress. She has no wheezes.  Abdominal: Soft. She exhibits no distension.  Musculoskeletal: Normal range of motion.  Neurological: She is alert and oriented to person, place, and time.  Skin: Skin is warm and dry. No rash noted. No pallor.    ED Course  Procedures (including critical care time)   Labs Reviewed  URINALYSIS, ROUTINE W REFLEX MICROSCOPIC   No results found.   No diagnosis found.  End of shift.  Report given to Fayrene Helper , who will reassess in 30-40 minutes  for resolution of headache after IM Toradol  MDM  Obtain urine, rule out UTI, pregnancy treat headache with nonsteroidals, nausea with Zofran        Arman Filter, NP 09/23/11 [phone]

## 2011-09-23 NOTE — Discharge Instructions (Signed)
Headache Headaches are caused by many different problems. Most commonly, headache is caused by muscle tension from an injury, fatigue, or emotional upset. Excessive muscle contractions in the scalp and neck result in a headache that often feels like a tight band around the head. Tension headaches often have areas of tenderness over the scalp and the back of the neck. These headaches may last for hours, days, or longer, and some may contribute to migraines in those who have migraine problems. Migraines usually cause a throbbing headache, which is made worse by activity. Sometimes only one side of the head hurts. Nausea, vomiting, eye pain, and avoidance of food are common with migraines. Visual symptoms such as light sensitivity, blind spots, or flashing lights may also occur. Loud noises may worsen migraine headaches. Many factors may cause migraine headaches:  Emotional stress, lack of sleep, and menstrual periods.   Alcohol and some drugs (such as birth control pills).   Diet factors (fasting, caffeine, food preservatives, chocolate).   Environmental factors (weather changes, bright lights, odors, smoke).  Other causes of headaches include minor injuries to the head. Arthritis in the neck; problems with the jaw, eyes, ears, or nose are also causes of headaches. Allergies, drugs, alcohol, and exposure to smoke can also cause moderate headaches. Rebound headaches can occur if someone uses pain medications for a long period of time and then stops. Less commonly, blood vessel problems in the neck and brain (including stroke) can cause various types of headache. Treatment of headaches includes medicines for pain and relaxation. Ice packs or heat applied to the back of the head and neck help some people. Massaging the shoulders, neck and scalp are often very useful. Relaxation techniques and stretching can help prevent these headaches. Avoid alcohol and cigarette smoking as these tend to make headaches  worse. Please see your caregiver if your headache is not better in 2 days.  SEEK IMMEDIATE MEDICAL CARE IF:   You develop a high fever, chills, or repeated vomiting.   You faint or have difficulty with vision.   You develop unusual numbness or weakness of your arms or legs.   Relief of pain is inadequate with medication, or you develop severe pain.   You develop confusion, or neck stiffness.   You have a worsening of a headache or do not obtain relief.  Document Released: 07/04/2005 Document Revised: 06/23/2011 Document Reviewed: 12/28/2006 [facility] Patient Information 2012 [place], [place].  Dizziness Dizziness is a common problem. It is a feeling of unsteadiness or lightheadedness. You may feel like you are about to faint. Dizziness can lead to injury if you stumble or fall. A person of any age group can suffer from dizziness, but dizziness is more common in older adults. CAUSES  Dizziness can be caused by many different things, including:  Middle ear problems.   Standing for too long.   Infections.   An allergic reaction.   Aging.   An emotional response to something, such as the sight of blood.   Side effects of medicines.   Fatigue.   Problems with circulation or blood pressure.   Excess use of alcohol, medicines, or illegal drug use.   Breathing too fast (hyperventilation).   An arrhythmia or problems with your heart rhythm.   Low red blood cell count (anemia).   Pregnancy.   Vomiting, diarrhea, fever, or other illnesses that cause dehydration.   Diseases or conditions such as Parkinson's disease, high blood pressure (hypertension), diabetes, and thyroid problems.   Exposure to  extreme heat.  DIAGNOSIS  To find the cause of your dizziness, your caregiver may do a physical exam, lab tests, radiologic imaging scans, or an electrocardiography test (ECG).  TREATMENT  Treatment of dizziness depends on the cause of your symptoms and can vary greatly. HOME  CARE INSTRUCTIONS   Drink enough fluids to keep your urine clear or pale yellow. This is especially important in very hot weather. In the elderly, it is also important in cold weather.   If your dizziness is caused by medicines, take them exactly as directed. When taking blood pressure medicines, it is especially important to get up slowly.   Rise slowly from chairs and steady yourself until you feel okay.   In the morning, first sit up on the side of the bed. When this seems okay, stand slowly while holding onto something until you know your balance is fine.   If you need to stand in one place for a long time, be sure to move your legs often. Tighten and relax the muscles in your legs while standing.   If dizziness continues to be a problem, have someone stay with you for a day or two. Do this until you feel you are well enough to stay alone. Have the person call your caregiver if he or she notices changes in you that are concerning.   Do not drive or use heavy machinery if you feel dizzy.  SEEK IMMEDIATE MEDICAL CARE IF:   Your dizziness or lightheadedness gets worse.   You feel nauseous or vomit.   You develop problems with talking, walking, weakness, or using your arms, hands, or legs.   You are not thinking clearly or you have difficulty forming sentences. It may take a friend or family member to determine if your thinking is normal.   You develop chest pain, abdominal pain, shortness of breath, or sweating.   Your vision changes.   You notice any bleeding.   You have side effects from medicine that seems to be getting worse rather than better.  MAKE SURE YOU:   Understand these instructions.   Will watch your condition.   Will get help right away if you are not doing well or get worse.  Document Released: 12/28/2000 Document Revised: 06/23/2011 Document Reviewed: 01/21/2011 [facility] Patient Information 2012 [place], [place].

## 2011-09-23 NOTE — ED Provider Notes (Signed)
Medical screening examination/treatment/procedure(s) were performed by non-physician practitioner and as supervising physician I was immediately available for consultation/collaboration.  Payal Stanforth R. Malone Vanblarcom, MD 09/23/11 0741 

## 2011-09-23 NOTE — ED Notes (Signed)
Pt states today in class they were learning how to irrigate ears  States she became dizzy at that time but felt fine afterward but once she got home her ears started hurting

## 2011-09-24 ENCOUNTER — Emergency Department (HOSPITAL_COMMUNITY)
Admission: EM | Admit: 2011-09-24 | Discharge: 2011-09-25 | Disposition: A | Discharge status: Home Health | Payer: Medicaid Other | Attending: Emergency Medicine | Admitting: Emergency Medicine

## 2011-09-24 ENCOUNTER — Encounter (HOSPITAL_COMMUNITY): Payer: Self-pay | Admitting: Emergency Medicine

## 2011-09-24 DIAGNOSIS — R51 Headache: Secondary | ICD-10-CM | POA: Insufficient documentation

## 2011-09-24 DIAGNOSIS — H53149 Visual discomfort, unspecified: Secondary | ICD-10-CM | POA: Insufficient documentation

## 2011-09-24 DIAGNOSIS — H6090 Unspecified otitis externa, unspecified ear: Secondary | ICD-10-CM

## 2011-09-24 DIAGNOSIS — Z79899 Other long term (current) drug therapy: Secondary | ICD-10-CM | POA: Insufficient documentation

## 2011-09-24 DIAGNOSIS — I10 Essential (primary) hypertension: Secondary | ICD-10-CM | POA: Insufficient documentation

## 2011-09-24 DIAGNOSIS — R42 Dizziness and giddiness: Secondary | ICD-10-CM | POA: Insufficient documentation

## 2011-09-24 DIAGNOSIS — F17200 Nicotine dependence, unspecified, uncomplicated: Secondary | ICD-10-CM | POA: Insufficient documentation

## 2011-09-24 DIAGNOSIS — H6690 Otitis media, unspecified, unspecified ear: Secondary | ICD-10-CM

## 2011-09-24 DIAGNOSIS — R110 Nausea: Secondary | ICD-10-CM | POA: Insufficient documentation

## 2011-09-24 MED ORDER — AMOXICILLIN-POT CLAVULANATE 875-125 MG PO TABS
1.0000 | ORAL_TABLET | Freq: Once | ORAL | Status: AC
  Administered 2011-09-25: 1 via ORAL
  Filled 2011-09-24: qty 1

## 2011-09-24 MED ORDER — NEOMYCIN-COLIST-HC-THONZONIUM 3.3-3-10-0.5 MG/ML OT SUSP
3.0000 [drp] | OTIC | Status: DC
  Administered 2011-09-25: 3 [drp] via OTIC
  Filled 2011-09-24: qty 5

## 2011-09-24 MED ORDER — HYDROCODONE-ACETAMINOPHEN 5-325 MG PO TABS
1.0000 | ORAL_TABLET | Freq: Once | ORAL | Status: AC
  Administered 2011-09-25: 1 via ORAL
  Filled 2011-09-24: qty 1

## 2011-09-24 NOTE — ED Notes (Addendum)
Patient complaining of sore throat and bilateral earache for the past two days; patient reports history of strep throat (two years ago).  Rates pain 10/10 on the numerical pain scale; describes pain as "sharp" and "aching".  Patient reports recent cold (three weeks ago); denies cold symptoms now.  Patient states that she was seen at [facility] last night for a severe headache and syncopal episodes.  Patient denies any syncopal episodes today; denies headache.  Denies dizziness and blurred vision.  Patient alert and oriented x4; PERRL present.  Will continue to monitor.

## 2011-09-24 NOTE — ED Notes (Signed)
PT. REPORTS SORE THROAT AND BILATERAL EAR ACHE WITH HEADACHE FOR SEVERAL DAYS.

## 2011-09-25 MED ORDER — AMOXICILLIN-POT CLAVULANATE 875-125 MG PO TABS: 1.0000 | ORAL_TABLET | Freq: Once | ORAL | Status: AC

## 2011-09-25 MED ORDER — HYDROCODONE-ACETAMINOPHEN 5-325 MG PO TABS: 1.0000 | ORAL_TABLET | Freq: Four times a day (QID) | ORAL | Status: AC | PRN

## 2011-09-25 NOTE — Discharge Instructions (Signed)
Otitis Externa Otitis externa ("swimmer's ear") is a germ (bacterial) or fungal infection of the outer ear canal (from the eardrum to the outside of the ear). Swimming in dirty water may cause swimmer's ear. It also may be caused by moisture in the ear from water remaining after swimming or bathing. Often the first signs of infection may be itching in the ear canal. This may progress to ear canal swelling, redness, and pus drainage, which may be signs of infection. HOME CARE INSTRUCTIONS   Apply the antibiotic drops to the ear canal as prescribed by your doctor.   This can be a very painful medical condition. A strong pain reliever may be prescribed.   Only take over-the-counter or prescription medicines for pain, discomfort, or fever as directed by your caregiver.   If your caregiver has given you a follow-up appointment, it is very important to keep that appointment. Not keeping the appointment could result in a chronic or permanent injury, pain, hearing loss and disability. If there is any problem keeping the appointment, you must call back to this facility for assistance.  PREVENTION   It is important to keep your ear dry. Use the corner of a towel to wick water out of the ear canal after swimming or bathing.   Avoid scratching in your ear. This can damage the ear canal or remove the protective wax lining the canal and make it easier for germs (bacteria) or a fungus to grow.   You may use ear drops made of rubbing alcohol and vinegar after swimming to prevent future "swimmer's ear" infections. Make up a small bottle of equal parts white vinegar and alcohol. Put 3 or 4 drops into each ear after swimming.   Avoid swimming in lakes, polluted water, or poorly chlorinated pools.  SEEK MEDICAL CARE IF:   An oral temperature above 102 F (38.9 C) develops.   Your ear is still painful after 3 days and shows signs of getting worse (redness, swelling, pain, or pus).  MAKE SURE YOU:   Understand  these instructions.   Will watch your condition.   Will get help right away if you are not doing well or get worse.  Document Released: 07/04/2005 Document Revised: 06/23/2011 Document Reviewed: 02/08/2008 [facility] Patient Information 2012 [place], [place].Otitis Media, Adult A middle ear infection is an infection in the space behind the eardrum. The medical name for this is "otitis media." It may happen after a common cold. It is caused by a germ that starts growing in that space. You may feel swollen glands in your neck on the side of the ear infection. HOME CARE INSTRUCTIONS   Take your medicine as directed until it is gone, even if you feel better after the first few days.   Only take over-the-counter or prescription medicines for pain, discomfort, or fever as directed by your caregiver.   Occasional use of a nasal decongestant a couple times per day may help with discomfort and help the eustachian tube to drain better.  Follow up with your caregiver in 10 to 14 days or as directed, to be certain that the infection has cleared. Not keeping the appointment could result in a chronic or permanent injury, pain, hearing loss and disability. If there is any problem keeping the appointment, you must call back to this facility for assistance. SEEK IMMEDIATE MEDICAL CARE IF:   You are not getting better in 2 to 3 days.   You have pain that is not controlled with medication.  You feel worse instead of better.   You cannot use the medication as directed.   You develop swelling, redness or pain around the ear or stiffness in your neck.  MAKE SURE YOU:   Understand these instructions.   Will watch your condition.   Will get help right away if you are not doing well or get worse.  Document Released: 04/08/2004 Document Revised: 06/23/2011 Document Reviewed: 02/08/2008 [facility] Patient Information 2012 [place], [place]. Use a provided ear drops every 6 hours while awake for the next 5 days on  one side and a 2 drops in or insula.  Tabs and use an cotton ear wick to keep them in place.  If this is not getting better in the next week and with Dr. Jenne Pane for further evaluation

## 2011-09-25 NOTE — ED Notes (Signed)
Patient currently resting quietly in bed; no respiratory or acute distress noted.  Patient updated on plan of care; informed patient that we are currently waiting on discharge paperwork from [facility].  Patient has no other questions or concerns at this time; will continue to monitor.

## 2011-09-25 NOTE — ED Provider Notes (Signed)
History     CSN: [id number]  Arrival date & time 09/24/11  2153   First MD Initiated Contact with Patient 09/24/11 2313      Chief Complaint  Patient presents with  . Sore Throat    (Consider location/radiation/quality/duration/timing/severity/associated sxs/prior treatment) HPI Comments: Right ear pain, radiating to her throat.  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  His procedure was performed on her with a headache and had no sign of ear infection.  Now.  She is having increased pain in her right ear pain is clear.  Canal with movement of the pinna, she also noticed that when she coughs.  It is small amount of blood in the sputum  Patient is a 26 y.o. female presenting with pharyngitis. The history is provided by the patient.  Sore Throat This is a new problem. The current episode started yesterday. Associated symptoms include a sore throat. Pertinent negatives include no fever or weakness.    Past Medical History  Diagnosis Date  . Hypertension     Past Surgical History  Procedure Date  . Cystectomy   . Wisdom tooth extraction   . Pilonidal cyst excision     No family history on file.  History  Substance Use Topics  . Smoking status: Current Everyday Smoker -- 0.2 packs/day    Types: Cigarettes  . Smokeless tobacco: Not on file   Comment: 4 CIGS A DAY  . Alcohol Use: No    OB History    Grav Para Term Preterm Abortions TAB SAB Ect Mult Living                  Review of Systems  Constitutional: Negative for fever.  HENT: Positive for ear pain and sore throat. Negative for hearing loss, neck stiffness and ear discharge.   Neurological: Negative for dizziness and weakness.    Allergies  Review of patient's allergies indicates no known allergies.  Home Medications   Current Outpatient Rx  Name Route Sig Dispense Refill  . ETONOGESTREL 68 MG Hunt IMPL Subcutaneous Inject 1 each into the skin once. Every 3  years    . TRAMADOL HCL 50 MG PO TABS Oral Take 50 mg by mouth every 6 (six) hours as needed. pain    . ZOLPIDEM TARTRATE 10 MG PO TABS Oral Take 10 mg by mouth at bedtime as needed. For Insomnia    . AMOXICILLIN-POT CLAVULANATE 875-125 MG PO TABS Oral Take 1 tablet by mouth once. 15 tablet 0  . HYDROCODONE-ACETAMINOPHEN 5-325 MG PO TABS Oral Take 1 tablet by mouth every 6 (six) hours as needed for pain. 10 tablet 0    BP 134/89  Pulse 80  Temp(Src) 98.3 F (36.8 C) (Oral)  Resp 16  SpO2 100%  LMP 07/19/2011  Physical Exam  Constitutional: She is oriented to person, place, and time. She appears well-developed and well-nourished.  HENT:  Head: Normocephalic. There is trismus in the jaw.  Right Ear: There is swelling and tenderness. No drainage. No mastoid tenderness. Tympanic membrane is injected and erythematous. A middle ear effusion is present. No hemotympanum. No decreased hearing is noted.  Mouth/Throat: Uvula is midline. No uvula swelling. Posterior oropharyngeal erythema present.  Eyes: Pupils are equal, round, and reactive to light.  Neck: Normal range of motion.       Enlarged lymph nodes, along the anterior cervical chain  Cardiovascular: Normal rate.   Pulmonary/Chest: Effort normal.  Abdominal: Soft.  Musculoskeletal: Normal range of motion.  Neurological: She is alert and oriented to person, place, and time.  Skin: Skin is warm.    ED Course  Procedures (including critical care time)  Labs Reviewed - No data to display No results found.   1. Otitis media   2. Otitis externa       MDM   This patient has an otitis media and otitis externa.  No indication of any perforated TM        Arman Filter, NP 09/25/11 0020  Arman Filter, NP 09/25/11 0020

## 2011-09-25 NOTE — ED Notes (Addendum)
Patient given discharge paperwork; went over discharge instructions with patient.  Patient instructed to take Norco and Augmentin as directed, to finish Augmentin prescription completely, to follow up with Dr. Jenne Pane as needed, and to return to the ED for new, worsening, or concerning symptoms.  Patient instructed to use ear drops as directed (cortisporin TC).

## 2011-09-26 NOTE — ED Provider Notes (Signed)
Medical screening examination/treatment/procedure(s) were performed by non-physician practitioner and as supervising physician I was immediately available for consultation/collaboration.  Dennis Hegeman R. Keyandre Pileggi, MD 09/26/11 2017 

## 2011-09-27 NOTE — ED Provider Notes (Signed)
Medical screening examination/treatment/procedure(s) were performed by non-physician practitioner and as supervising physician I was immediately available for consultation/collaboration.   Vida Roller, MD 09/27/11 [phone]

## 2011-12-16 ENCOUNTER — Emergency Department (HOSPITAL_COMMUNITY)
Admission: EM | Admit: 2011-12-16 | Discharge: 2011-12-16 | Disposition: A | Discharge status: Home / Self Care | Payer: Medicaid Other | Attending: Emergency Medicine | Admitting: Emergency Medicine

## 2011-12-16 ENCOUNTER — Encounter (HOSPITAL_COMMUNITY): Payer: Self-pay | Admitting: Emergency Medicine

## 2011-12-16 DIAGNOSIS — M545 Low back pain, unspecified: Secondary | ICD-10-CM | POA: Insufficient documentation

## 2011-12-16 DIAGNOSIS — I10 Essential (primary) hypertension: Secondary | ICD-10-CM | POA: Insufficient documentation

## 2011-12-16 DIAGNOSIS — F17200 Nicotine dependence, unspecified, uncomplicated: Secondary | ICD-10-CM | POA: Insufficient documentation

## 2011-12-16 MED ORDER — HYDROCODONE-ACETAMINOPHEN 10-325 MG PO TABS: 1.0000 | ORAL_TABLET | Freq: Four times a day (QID) | ORAL | Status: AC | PRN

## 2011-12-16 MED ORDER — ORPHENADRINE CITRATE ER 100 MG PO TB12: 100.0000 mg | ORAL_TABLET | Freq: Two times a day (BID) | ORAL | Status: AC

## 2011-12-16 MED ORDER — NAPROXEN 500 MG PO TABS: 500.0000 mg | ORAL_TABLET | Freq: Two times a day (BID) | ORAL | Status: DC

## 2011-12-16 NOTE — Discharge Instructions (Signed)
Make an appointment with your primary care provider. Discussed with him whether you would benefit from referral to a pain management clinic or an orthopedic doctor. or a neurosurgeon.  Back Pain, Adult Low back pain is very common. About 1 in 5 people have back pain.The cause of low back pain is rarely dangerous. The pain often gets better over time.About half of people with a sudden onset of back pain feel better in just 2 weeks. About 8 in 10 people feel better by 6 weeks.  CAUSES Some common causes of back pain include:  Strain of the muscles or ligaments supporting the spine.   Wear and tear (degeneration) of the spinal discs.   Arthritis.   Direct injury to the back.  DIAGNOSIS Most of the time, the direct cause of low back pain is not known.However, back pain can be treated effectively even when the exact cause of the pain is unknown.Answering your caregiver's questions about your overall health and symptoms is one of the most accurate ways to make sure the cause of your pain is not dangerous. If your caregiver needs more information, he or she may order lab work or imaging tests (X-rays or MRIs).However, even if imaging tests show changes in your back, this usually does not require surgery. HOME CARE INSTRUCTIONS For many people, back pain returns.Since low back pain is rarely dangerous, it is often a condition that people can learn to [facility] their own.   Remain active. It is stressful on the back to sit or stand in one place. Do not sit, drive, or stand in one place for more than 30 minutes at a time. Take short walks on level surfaces as soon as pain allows.Try to increase the length of time you walk each day.   Do not stay in bed.Resting more than 1 or 2 days can delay your recovery.   Do not avoid exercise or work.Your body is made to move.It is not dangerous to be active, even though your back may hurt.Your back will likely heal faster if you return to being active  before your pain is gone.   Pay attention to your body when you bend and lift. Many people have less discomfortwhen lifting if they bend their knees, keep the load close to their bodies,and avoid twisting. Often, the most comfortable positions are those that put less stress on your recovering back.   Find a comfortable position to sleep. Use a firm mattress and lie on your side with your knees slightly bent. If you lie on your back, put a pillow under your knees.   Only take over-the-counter or prescription medicines as directed by your caregiver. Over-the-counter medicines to reduce pain and inflammation are often the most helpful.Your caregiver may prescribe muscle relaxant drugs.These medicines help dull your pain so you can more quickly return to your normal activities and healthy exercise.   Put ice on the injured area.   Put ice in a plastic bag.   Place a towel between your skin and the bag.   Leave the ice on for 15 to 20 minutes, 3 to 4 times a day for the first 2 to 3 days. After that, ice and heat may be alternated to reduce pain and spasms.   Ask your caregiver about trying back exercises and gentle massage. This may be of some benefit.   Avoid feeling anxious or stressed.Stress increases muscle tension and can worsen back pain.It is important to recognize when you are anxious or stressed and  learn ways to manage it.Exercise is a great option.  SEEK MEDICAL CARE IF:  You have pain that is not relieved with rest or medicine.   You have pain that does not improve in 1 week.   You have new symptoms.   You are generally not feeling well.  SEEK IMMEDIATE MEDICAL CARE IF:   You have pain that radiates from your back into your legs.   You develop new bowel or bladder control problems.   You have unusual weakness or numbness in your arms or legs.   You develop nausea or vomiting.   You develop abdominal pain.   You feel faint.  Document Released: 07/04/2005  Document Revised: 06/23/2011 Document Reviewed: 11/22/2010 [facility] Patient Information 2012 [place], [place].  Acetaminophen; Hydrocodone tablets or capsules What is this medicine? ACETAMINOPHEN; HYDROCODONE (a set a MEE noe fen; hye droe KOE done) is a pain reliever. It is used to treat mild to moderate pain. This medicine may be used for other purposes; ask your health care provider or pharmacist if you have questions. What should I tell my health care provider before I take this medicine? They need to know if you have any of these conditions: -brain tumor -Crohn's disease, inflammatory bowel disease, or ulcerative colitis -drink more than 3 alcohol-containing drinks per day -drug abuse or addiction -head injury -heart or circulation problems -kidney disease or problems going to the bathroom -liver disease -lung disease, asthma, or breathing problems -an unusual or allergic reaction to acetaminophen, hydrocodone, other opioid analgesics, other medicines, foods, dyes, or preservatives -pregnant or trying to get pregnant -breast-feeding How should I use this medicine? Take this medicine by mouth. Swallow it with a full glass of water. Follow the directions on the prescription label. If the medicine upsets your stomach, take the medicine with food or milk. Do not take more than you are told to take. Talk to your pediatrician regarding the use of this medicine in children. This medicine is not approved for use in children. Overdosage: If you think you have taken too much of this medicine contact a poison control center or emergency room at once. NOTE: This medicine is only for you. Do not share this medicine with others. What if I miss a dose? If you miss a dose, take it as soon as you can. If it is almost time for your next dose, take only that dose. Do not take double or extra doses. What may interact with this medicine? -alcohol or medicines that contain  alcohol -antihistamines -isoniazid -medicines for depression, anxiety, or psychotic disturbances -medicines for pain including pentazocine, buprenorphine, butorphanol, nalbuphine, tramadol, and propoxyphene -medicines for sleep -muscle relaxants -naltrexone -phenobarbital -ritonavir This list may not describe all possible interactions. Give your health care provider a list of all the medicines, herbs, non-prescription drugs, or dietary supplements you use. Also tell them if you smoke, drink alcohol, or use illegal drugs. Some items may interact with your medicine. What should I watch for while using this medicine? Tell your doctor or health care professional if your pain does not go away, if it gets worse, or if you have new or a different type of pain. You may develop tolerance to the medicine. Tolerance means that you will need a higher dose of the medicine for pain relief. Tolerance is normal and is expected if you take the medicine for a long time. Do not suddenly stop taking your medicine because you may develop a severe reaction. Your body becomes used to  the medicine. This does NOT mean you are addicted. Addiction is a behavior related to getting and using a drug for a non-medical reason. If you have pain, you have a medical reason to take pain medicine. Your doctor will tell you how much medicine to take. If your doctor wants you to stop the medicine, the dose will be slowly lowered over time to avoid any side effects. You may get drowsy or dizzy when you first start taking the medicine or change doses. Do not drive, use machinery, or do anything that may be dangerous until you know how the medicine affects you. Stand or sit up slowly. The medicine will cause constipation. Try to have a bowel movement at least every 2 to 3 days. If you do not have a bowel movement for 3 days, call your doctor or health care professional. Too much acetaminophen can be very dangerous. Do not take Tylenol  (acetaminophen) or medicines that contain acetaminophen with this medicine. Many non-prescription medicines contain acetaminophen. Always read the labels carefully. What side effects may I notice from receiving this medicine? Side effects that you should report to your doctor or health care professional as soon as possible: -allergic reactions like skin rash, itching or hives, swelling of the face, lips, or tongue -breathing problems -confusion -feeling faint or lightheaded, falls -stomach pain -yellowing of the eyes or skin Side effects that usually do not require medical attention (report to your doctor or health care professional if they continue or are bothersome): -nausea, vomiting -stomach upset This list may not describe all possible side effects. Call your doctor for medical advice about side effects. You may report side effects to FDA at 1-800-FDA-1088. Where should I keep my medicine? Keep out of the reach of children. This medicine can be abused. Keep your medicine in a safe place to protect it from theft. Do not share this medicine with anyone. Selling or giving away this medicine is dangerous and against the law. Store at room temperature between 15 and 30 degrees C (59 and 86 degrees F). Protect from light. Keep container tightly closed. Throw away any unused medicine after the expiration date. NOTE: This sheet is a summary. It may not cover all possible information. If you have questions about this medicine, talk to your doctor, pharmacist, or health care provider.  2012, [employer] Standard. (09/25/2007 10:25:07 AM)  Orphenadrine tablets What is this medicine? ORPHENADRINE (or FEN a dreen) helps to relieve pain and stiffness in muscles and can treat muscle spasms. This medicine may be used for other purposes; ask your health care provider or pharmacist if you have questions. What should I tell my health care provider before I take this medicine? They need to know if you  have any of these conditions: -glaucoma -heart disease -kidney disease -myasthenia gravis -peptic ulcer disease -prostate disease -stomach problems -an unusual or allergic reaction to orphenadrine, other medicines, foods, lactose, dyes, or preservatives -pregnant or trying to get pregnant -breast-feeding How should I use this medicine? Take this medicine by mouth with a full glass of water. Follow the directions on the prescription label. Take your medicine at regular intervals. Do not take your medicine more often than directed. Do not take more than you are told to take. Talk to your pediatrician regarding the use of this medicine in children. Special care may be needed. Patients over 33 years old may have a stronger reaction and need a smaller dose. Overdosage: If you think you have taken too much of  this medicine contact a poison control center or emergency room at once. NOTE: This medicine is only for you. Do not share this medicine with others. What if I miss a dose? If you miss a dose, take it as soon as you can. If it is almost time for your next dose, take only that dose. Do not take double or extra doses. What may interact with this medicine? -alcohol -antihistamines -barbiturates, like phenobarbital -benzodiazepines -cyclobenzaprine -medicines for pain -phenothiazines like chlorpromazine, mesoridazine, prochlorperazine, thioridazine This list may not describe all possible interactions. Give your health care provider a list of all the medicines, herbs, non-prescription drugs, or dietary supplements you use. Also tell them if you smoke, drink alcohol, or use illegal drugs. Some items may interact with your medicine. What should I watch for while using this medicine? Your mouth may get dry. Chewing sugarless gum or sucking hard candy, and drinking plenty of water may help. Contact your doctor if the problem does not go away or is severe. This medicine may cause dry eyes and  blurred vision. If you wear contact lenses you may feel some discomfort. Lubricating drops may help. See your eye doctor if the problem does not go away or is severe. You may get drowsy or dizzy. Do not drive, use machinery, or do anything that needs mental alertness until you know how this medicine affects you. Do not stand or sit up quickly, especially if you are an older patient. This reduces the risk of dizzy or fainting spells. Alcohol may interfere with the effect of this medicine. Avoid alcoholic drinks. What side effects may I notice from receiving this medicine? Side effects that you should report to your doctor or health care professional as soon as possible: -allergic reactions like skin rash, itching or hives, swelling of the face, lips, or tongue -changes in vision -difficulty breathing -fast heartbeat or palpitations -hallucinations -light headedness, fainting spells -vomiting Side effects that usually do not require medical attention (report to your doctor or health care professional if they continue or are bothersome): -dizziness -drowsiness -headache -nausea This list may not describe all possible side effects. Call your doctor for medical advice about side effects. You may report side effects to FDA at 1-800-FDA-1088. Where should I keep my medicine? Keep out of the reach of children. Store at room temperature between 15 and 30 degrees C (59 and 86 degrees F). Protect from light. Keep container tightly closed. Throw away any unused medicine after the expiration date. NOTE: This sheet is a summary. It may not cover all possible information. If you have questions about this medicine, talk to your doctor, pharmacist, or health care provider.  2012, [employer]vier/[employer] Standard. (01/29/2008 5:19:12 PM)  Naproxen and naproxen sodium oral immediate-release tablets What is this medicine? NAPROXEN (na PROX en) is a non-steroidal anti-inflammatory drug (NSAID). It is used to reduce  swelling and to treat pain. This medicine may be used for dental pain, headache, or painful monthly periods. It is also used for painful joint and muscular problems such as arthritis, tendinitis, bursitis, and gout. This medicine may be used for other purposes; ask your health care provider or pharmacist if you have questions. What should I tell my health care provider before I take this medicine? They need to know if you have any of these conditions: -asthma -cigarette smoker -drink more than 3 alcohol containing drinks a day -heart disease or circulation problems such as heart failure or leg edema (fluid retention) -high blood pressure -kidney disease -  liver disease -stomach bleeding or ulcers -an unusual or allergic reaction to naproxen, aspirin, other NSAIDs, other medicines, foods, dyes, or preservatives -pregnant or trying to get pregnant -breast-feeding How should I use this medicine? Take this medicine by mouth with a glass of water. Follow the directions on the prescription label. Take it with food if your stomach gets upset. Try to not lie down for at least 10 minutes after you take it. Take your medicine at regular intervals. Do not take your medicine more often than directed. Long-term, continuous use may increase the risk of heart attack or stroke. A special MedGuide will be given to you by the pharmacist with each prescription and refill. Be sure to read this information carefully each time. Talk to your pediatrician regarding the use of this medicine in children. Special care may be needed. Overdosage: If you think you have taken too much of this medicine contact a poison control center or emergency room at once. NOTE: This medicine is only for you. Do not share this medicine with others. What if I miss a dose? If you miss a dose, take it as soon as you can. If it is almost time for your next dose, take only that dose. Do not take double or extra doses. What may interact with  this medicine? -alcohol -aspirin -cidofovir -diuretics -lithium -methotrexate -other drugs for inflammation like ketorolac or prednisone -pemetrexed -probenecid -warfarin This list may not describe all possible interactions. Give your health care provider a list of all the medicines, herbs, non-prescription drugs, or dietary supplements you use. Also tell them if you smoke, drink alcohol, or use illegal drugs. Some items may interact with your medicine. What should I watch for while using this medicine? Tell your doctor or health care professional if your pain does not get better. Talk to your doctor before taking another medicine for pain. Do not treat yourself. This medicine does not prevent heart attack or stroke. In fact, this medicine may increase the chance of a heart attack or stroke. The chance may increase with longer use of this medicine and in people who have heart disease. If you take aspirin to prevent heart attack or stroke, talk with your doctor or health care professional. Do not take other medicines that contain aspirin, ibuprofen, or naproxen with this medicine. Side effects such as stomach upset, nausea, or ulcers may be more likely to occur. Many medicines available without a prescription should not be taken with this medicine. This medicine can cause ulcers and bleeding in the stomach and intestines at any time during treatment. Do not smoke cigarettes or drink alcohol. These increase irritation to your stomach and can make it more susceptible to damage from this medicine. Ulcers and bleeding can happen without warning symptoms and can cause death. You may get drowsy or dizzy. Do not drive, use machinery, or do anything that needs mental alertness until you know how this medicine affects you. Do not stand or sit up quickly, especially if you are an older patient. This reduces the risk of dizzy or fainting spells. This medicine can cause you to bleed more easily. Try to avoid  damage to your teeth and gums when you brush or floss your teeth. What side effects may I notice from receiving this medicine? Side effects that you should report to your doctor or health care professional as soon as possible: -black or bloody stools, blood in the urine or vomit -blurred vision -chest pain -difficulty breathing or wheezing -nausea or  vomiting -severe stomach pain -skin rash, skin redness, blistering or peeling skin, hives, or itching -slurred speech or weakness on one side of the body -swelling of eyelids, throat, lips -unexplained weight gain or swelling -unusually weak or tired -yellowing of eyes or skin Side effects that usually do not require medical attention (report to your doctor or health care professional if they continue or are bothersome): -constipation -headache -heartburn This list may not describe all possible side effects. Call your doctor for medical advice about side effects. You may report side effects to FDA at 1-800-FDA-1088. Where should I keep my medicine? Keep out of the reach of children. Store at room temperature between 15 and 30 degrees C (59 and 86 degrees F). Keep container tightly closed. Throw away any unused medicine after the expiration date. NOTE: This sheet is a summary. It may not cover all possible information. If you have questions about this medicine, talk to your doctor, pharmacist, or health care provider.  2012, [employer]vier/Gold Standard. (07/06/2009 8:10:16 PM)

## 2011-12-16 NOTE — ED Provider Notes (Signed)
This chart was scribed for Dione Booze, MD by Wallis Mart. The patient was seen in room STRE1/STRE1 and the patient's care was started at 12:33PM.   CSN: [id number]  Arrival date & time 12/16/11  1155   First MD Initiated Contact with Patient 12/16/11 1226      Chief Complaint  Patient presents with  . Back Pain    (Consider location/radiation/quality/duration/timing/severity/associated sxs/prior treatment) HPI   Stacy [name] is a 26 y.o. female who presents to the Emergency Department complaining of sudden onset, chronic, intermittent, gradually worsening lower back pain that radiates to her left leg. Pt states that she had an epidural in 2011 and has been having sharp shooting pain in her back and down her left leg  since that time. Pt states that that the current episode started a week ago and rates pain as 9/10. Pt states that sitting down, walking, certain positions make the pain worse. Using a heating pad gives temporary relief, OTC medications give no relief. Pt has previously been rx'ed tramadol and percocet with relief of pain, but has run out of rx medications.  Pt denies weakness, numbness, tingling, trouble with bowels.  Pt denies ever being on muscle relaxants or anti-inflamatory med. There are no other associated symptoms and no other alleviating or aggravating factors.   Past Medical History  Diagnosis Date  . Hypertension     Past Surgical History  Procedure Date  . Cystectomy   . Wisdom tooth extraction   . Pilonidal cyst excision     No family history on file.  History  Substance Use Topics  . Smoking status: Current Everyday Smoker -- 0.2 packs/day    Types: Cigarettes  . Smokeless tobacco: Not on file   Comment: 4 CIGS A DAY  . Alcohol Use: No    OB History    Grav Para Term Preterm Abortions TAB SAB Ect Mult Living                  Review of Systems  Musculoskeletal: Positive for back pain.  All other systems reviewed and are  negative.    Allergies  Review of patient's allergies indicates no known allergies.  Home Medications   Current Outpatient Rx  Name Route Sig Dispense Refill  . IBUPROFEN 200 MG PO TABS Oral Take 400 mg by mouth every 6 (six) hours as needed. For pain    . TRAMADOL HCL 50 MG PO TABS Oral Take 50 mg by mouth every 6 (six) hours as needed. pain      BP 135/93  Pulse 90  Temp(Src) 98 F (36.7 C) (Oral)  Resp 16  SpO2 100%  Physical Exam  Nursing note and vitals reviewed. Constitutional: She is oriented to person, place, and time. She appears well-developed and well-nourished. No distress.  HENT:  Head: Normocephalic and atraumatic.  Eyes: EOM are normal.  Neck: Neck supple. No tracheal deviation present.  Cardiovascular: Normal rate.   Pulmonary/Chest: Effort normal. No respiratory distress.  Musculoskeletal: Normal range of motion. She exhibits tenderness.       Moderate tenderness of the lumbar spine, negative straight leg raise, mild bilateral paralumbar tenderenss  Neurological: She is alert and oriented to person, place, and time.  Skin: Skin is warm and dry.  Psychiatric: She has a normal mood and affect. Her behavior is normal.    ED Course  Procedures (including critical care time) DIAGNOSTIC STUDIES: Oxygen Saturation is 100% on room air, normal by my interpretation.  COORDINATION OF CARE:     1. Lumbar pain       MDM  Chronic back pain. Discussed with the patient the fact that with chronic pain she needs to get off her narcotics from one provider. Have also discussed with her need for evaluation the chronic pain clinic or possibly evaluation by a neurosurgeon or orthopedic surgeon. She might benefit from an epidural steroid injection. Patient expressed understanding. She is sent home with a prescription for a small amount of Norco to get her through the weekend and also given prescriptions for orphenadrine and naproxen. She is to followup with her PCP to  get additional narcotic prescriptions and then talk about the above noted referrals. I have reviewed the patient's record on the [place] controlled substance reporting website and she has been averaging about 70 hydrocodone-acetaminophen tablets prescribed a month for the last 3 months.  I personally performed the services described in this documentation, which was scribed in my presence. The recorded information has been reviewed and considered.          Dione Booze, MD 12/16/11 1438

## 2011-12-16 NOTE — ED Notes (Signed)
Onset 2011 had epidural and since then having chronic back pain. Recently pain worsening overtime lower middle back pain. 9/10 sharp stabbing pain.

## 2012-05-22 ENCOUNTER — Encounter (HOSPITAL_COMMUNITY): Payer: Self-pay | Admitting: *Deleted

## 2012-05-22 ENCOUNTER — Emergency Department (HOSPITAL_COMMUNITY)
Admission: EM | Admit: 2012-05-22 | Discharge: 2012-05-22 | Disposition: A | Discharge status: Home / Self Care | Payer: Medicaid Other | Attending: Emergency Medicine | Admitting: Emergency Medicine

## 2012-05-22 ENCOUNTER — Emergency Department (HOSPITAL_COMMUNITY): Payer: Medicaid Other

## 2012-05-22 DIAGNOSIS — F17200 Nicotine dependence, unspecified, uncomplicated: Secondary | ICD-10-CM | POA: Insufficient documentation

## 2012-05-22 DIAGNOSIS — Y929 Unspecified place or not applicable: Secondary | ICD-10-CM | POA: Insufficient documentation

## 2012-05-22 DIAGNOSIS — S5001XA Contusion of right elbow, initial encounter: Secondary | ICD-10-CM

## 2012-05-22 DIAGNOSIS — I10 Essential (primary) hypertension: Secondary | ICD-10-CM | POA: Insufficient documentation

## 2012-05-22 DIAGNOSIS — W108XXA Fall (on) (from) other stairs and steps, initial encounter: Secondary | ICD-10-CM | POA: Insufficient documentation

## 2012-05-22 DIAGNOSIS — S5000XA Contusion of unspecified elbow, initial encounter: Secondary | ICD-10-CM | POA: Insufficient documentation

## 2012-05-22 DIAGNOSIS — Y9302 Activity, running: Secondary | ICD-10-CM | POA: Insufficient documentation

## 2012-05-22 MED ORDER — HYDROCODONE-ACETAMINOPHEN 5-325 MG PO TABS: 1.0000 | ORAL_TABLET | ORAL | Status: DC | PRN

## 2012-05-22 MED ORDER — OXYCODONE-ACETAMINOPHEN 5-325 MG PO TABS: 1.0000 | ORAL_TABLET | ORAL | Status: AC | PRN

## 2012-05-22 NOTE — Progress Notes (Signed)
Orthopedic Tech Progress Note Patient Details:  Stacy [name] 07-Mar-1986 [id number]  Ortho Devices Type of Ortho Device: Arm foam sling Ortho Device/Splint Location: right arm Ortho Device/Splint Interventions: Application   Nikki Dom 05/22/2012, 7:48 PM

## 2012-05-22 NOTE — ED Notes (Signed)
Pt ran down steps and hurt arm on Sunday.  Pt with right elbow pain

## 2012-05-22 NOTE — ED Provider Notes (Signed)
History  Scribed for Carleene Cooper III, MD, the patient was seen in room TR08C/TR08C. This chart was scribed by Candelaria Stagers. The patient's care started at 7:21 PM   CSN: [id number]  Arrival date & time 05/22/12  1717   First MD Initiated Contact with Patient 05/22/12 1918      No chief complaint on file.    The history is provided by the patient. No language interpreter was used.   Stacy [name] is a 26 y.o. female who presents to the Emergency Department complaining of right arm pain after running down steps and falling on her arm about nine days ago.  Pt reports that the pain has gradually gotten worse since then.  She has applied heat and has a brace in place on the elbow which has provided little relief.      Past Medical History  Diagnosis Date  . Hypertension     Past Surgical History  Procedure Date  . Cystectomy   . Wisdom tooth extraction   . Pilonidal cyst excision     No family history on file.  History  Substance Use Topics  . Smoking status: Current Every Day Smoker -- 0.2 packs/day    Types: Cigarettes  . Smokeless tobacco: Not on file     Comment: 4 CIGS A DAY  . Alcohol Use: No    OB History    Grav Para Term Preterm Abortions TAB SAB Ect Mult Living                  Review of Systems  Musculoskeletal: Positive for arthralgias (right arm pain).  All other systems reviewed and are negative.    Allergies  Review of patient's allergies indicates no known allergies.  Home Medications  No current outpatient prescriptions on file.  BP 132/81  Pulse 97  Temp 98.7 F (37.1 C) (Oral)  Resp 18  SpO2 97%  LMP 05/09/2012  Physical Exam  Nursing note and vitals reviewed. Constitutional: She is oriented to person, place, and time. She appears well-developed and well-nourished. No distress.  HENT:  Head: Normocephalic and atraumatic.  Eyes: Conjunctivae normal and EOM are normal.  Neck: Neck supple. No tracheal deviation present.    Cardiovascular: Normal rate.   Pulmonary/Chest: Effort normal. No respiratory distress.  Abdominal: She exhibits no distension.  Musculoskeletal:       Tenderness over olecranon.  No palpable deformity.  Pain with ROM.  Intact pulses, sensation, and tendon function.   Neurological: She is alert and oriented to person, place, and time. No sensory deficit.  Skin: Skin is warm and dry.  Psychiatric: She has a normal mood and affect. Her behavior is normal.    ED Course  Procedures   DIAGNOSTIC STUDIES: Oxygen Saturation is 97% on room air, normal by my interpretation.    COORDINATION OF CARE:  5:27 Ordered: DG Elbow Complete Right 7:26 PM Will give pain medication and provide a sling for the elbow.  Discussed follow up with an orthopaedist if pain does not improve.     Labs Reviewed - No data to display Dg Elbow Complete Right  05/22/2012  *RADIOLOGY REPORT*  Clinical Data: Larey Seat 2 days ago and injured the right elbow. Posterior elbow pain.  RIGHT ELBOW - COMPLETE 3+ VIEW  Comparison: None.  Findings: No evidence of acute, subacute, or healed fractures. Well-preserved joint spaces.  No intrinsic osseous abnormalities. No posterior fat pad sign to confirm joint effusion or hemarthrosis.  IMPRESSION: Normal examination.  Original Report Authenticated By: Hulan Saas, M.D.      IMP:  Contusion of right elbow  DISP:  Sling, hydrocodone-acetaminophen po q4h prn pain.  Advised this should heal over the next week. If not, will need followup with Dr. Lajoyce Corners, orthopedist on call.    I personally performed the services described in this documentation, which was scribed in my presence. The recorded information has been reviewed and considered.  Osvaldo Human, MD     Carleene Cooper III, MD 05/22/12 [phone]

## 2012-07-18 NOTE — L&D Delivery Note (Cosign Needed)
Delivery Note At 1:02 AM a viable female was delivered via Vaginal, Spontaneous Delivery (Presentation: Left Occiput Anterior).  APGAR: 9, 9; weight .   Placenta status: Intact, Spontaneous.  Cord: 3 vessels with the following complications: None.  Cord pH: N/A  Anesthesia: Epidural  Episiotomy: None Lacerations: None Suture Repair: NA Est. Blood Loss (mL): 300  Mom to postpartum.  Baby to Couplet care / Skin to Skin.  NSVD over intact perineum. Active management of 3rd stage. Pit and traction delviered intact placenta w/ 3v cord. EBL 300. Hemostatic. Counts correct.  Jolyn Lent RYAN 07/01/2013, 1:29 AM

## 2012-08-04 ENCOUNTER — Emergency Department (HOSPITAL_COMMUNITY)
Admission: EM | Admit: 2012-08-04 | Discharge: 2012-08-04 | Disposition: A | Discharge status: Home / Self Care | Payer: Medicaid Other | Attending: Emergency Medicine | Admitting: Emergency Medicine

## 2012-08-04 ENCOUNTER — Emergency Department (HOSPITAL_COMMUNITY): Payer: Medicaid Other

## 2012-08-04 ENCOUNTER — Encounter (HOSPITAL_COMMUNITY): Payer: Self-pay | Admitting: Emergency Medicine

## 2012-08-04 DIAGNOSIS — N939 Abnormal uterine and vaginal bleeding, unspecified: Secondary | ICD-10-CM | POA: Insufficient documentation

## 2012-08-04 DIAGNOSIS — F17200 Nicotine dependence, unspecified, uncomplicated: Secondary | ICD-10-CM | POA: Insufficient documentation

## 2012-08-04 DIAGNOSIS — N926 Irregular menstruation, unspecified: Secondary | ICD-10-CM | POA: Insufficient documentation

## 2012-08-04 DIAGNOSIS — Z87440 Personal history of urinary (tract) infections: Secondary | ICD-10-CM | POA: Insufficient documentation

## 2012-08-04 DIAGNOSIS — R109 Unspecified abdominal pain: Secondary | ICD-10-CM | POA: Insufficient documentation

## 2012-08-04 DIAGNOSIS — N898 Other specified noninflammatory disorders of vagina: Secondary | ICD-10-CM | POA: Insufficient documentation

## 2012-08-04 DIAGNOSIS — I10 Essential (primary) hypertension: Secondary | ICD-10-CM | POA: Insufficient documentation

## 2012-08-04 DIAGNOSIS — Z3202 Encounter for pregnancy test, result negative: Secondary | ICD-10-CM | POA: Insufficient documentation

## 2012-08-04 HISTORY — DX: Urinary tract infection, site not specified: N39.0

## 2012-08-04 LAB — URINE MICROSCOPIC-ADD ON

## 2012-08-04 LAB — CBC WITH DIFFERENTIAL/PLATELET
Basophils Absolute: 0.1 10*3/uL (ref 0.0–0.1)
Basophils Relative: 0 % (ref 0–1)
Eosinophils Absolute: 0.2 10*3/uL (ref 0.0–0.7)
Eosinophils Relative: 2 % (ref 0–5)
HCT: 35.5 % — ABNORMAL LOW (ref 36.0–46.0)
Hemoglobin: 12.4 g/dL (ref 12.0–15.0)
Lymphocytes Relative: 33 % (ref 12–46)
Lymphs Abs: 3.8 10*3/uL (ref 0.7–4.0)
MCH: 30.8 pg (ref 26.0–34.0)
MCHC: 34.9 g/dL (ref 30.0–36.0)
MCV: 88.1 fL (ref 78.0–100.0)
Monocytes Absolute: 0.8 10*3/uL (ref 0.1–1.0)
Monocytes Relative: 7 % (ref 3–12)
Neutro Abs: 6.7 10*3/uL (ref 1.7–7.7)
Neutrophils Relative %: 58 % (ref 43–77)
Platelets: 277 10*3/uL (ref 150–400)
RBC: 4.03 MIL/uL (ref 3.87–5.11)
RDW: 12.7 % (ref 11.5–15.5)
WBC: 11.6 10*3/uL — ABNORMAL HIGH (ref 4.0–10.5)

## 2012-08-04 LAB — WET PREP, GENITAL
Clue Cells Wet Prep HPF POC: NONE SEEN
Trich, Wet Prep: NONE SEEN
Yeast Wet Prep HPF POC: NONE SEEN

## 2012-08-04 LAB — URINALYSIS, ROUTINE W REFLEX MICROSCOPIC
Bilirubin Urine: NEGATIVE
Glucose, UA: NEGATIVE mg/dL
Ketones, ur: NEGATIVE mg/dL
Leukocytes, UA: NEGATIVE
Nitrite: NEGATIVE
Protein, ur: NEGATIVE mg/dL
Specific Gravity, Urine: 1.031 — ABNORMAL HIGH (ref 1.005–1.030)
Urobilinogen, UA: 1 mg/dL (ref 0.0–1.0)
pH: 6 (ref 5.0–8.0)

## 2012-08-04 LAB — PREGNANCY, URINE: Preg Test, Ur: NEGATIVE

## 2012-08-04 MED ORDER — HYDROCODONE-ACETAMINOPHEN 5-325 MG PO TABS
2.0000 | ORAL_TABLET | Freq: Once | ORAL | Status: AC
  Administered 2012-08-04: 2 via ORAL
  Filled 2012-08-04: qty 2

## 2012-08-04 MED ORDER — HYDROMORPHONE HCL PF 2 MG/ML IJ SOLN
2.0000 mg | Freq: Once | INTRAMUSCULAR | Status: AC
  Administered 2012-08-04: 2 mg via INTRAMUSCULAR
  Filled 2012-08-04: qty 1

## 2012-08-04 MED ORDER — MEDROXYPROGESTERONE ACETATE 5 MG PO TABS: 5.0000 mg | ORAL_TABLET | Freq: Every day | ORAL | Status: DC

## 2012-08-04 MED ORDER — TRAMADOL HCL 50 MG PO TABS: 50.0000 mg | ORAL_TABLET | Freq: Four times a day (QID) | ORAL | Status: DC | PRN

## 2012-08-04 MED ORDER — ONDANSETRON HCL 4 MG/2ML IJ SOLN
4.0000 mg | Freq: Once | INTRAMUSCULAR | Status: AC
  Administered 2012-08-04: 4 mg via INTRAMUSCULAR
  Filled 2012-08-04: qty 2

## 2012-08-04 NOTE — ED Provider Notes (Signed)
Patient signed out to me to followup on the ultrasound. Patient has had increased vaginal bleeding with pain and cramping. Lab work was unremarkable. An ultrasound was ordered to further evaluate. The results of the ultrasound are now back and there are no acute abnormalities. Patient is to be discharged with analgesia, Provera and followup with OB/GYN. Return if symptoms worsen.  Gilda Crease, MD 08/04/12 [phone]

## 2012-08-04 NOTE — ED Notes (Signed)
Attempted to give pt discharge instructions and prescriptions. Pt requests to have percocet 10 mg because "that's what I'm used to."

## 2012-08-04 NOTE — ED Provider Notes (Signed)
History     CSN: [id number]  Arrival date & time 08/04/12  1029   None     Chief Complaint  Patient presents with  . Back Pain  . Vaginal Bleeding    (Consider location/radiation/quality/duration/timing/severity/associated sxs/prior treatment) The history is provided by the patient. No language interpreter was used.  Patient complains of 1 day history of severe abdominal pain and back pain in conjunction with copious vaginal bleeding. Has passed numerous "golf ball sized" clots from vagina since yesterday morning, and uses about 5-6 pads an hour to capture draining vaginal blood, stated to be bright red in color. Pain is persistent and is not relieved with midol, pamprin, or ibuprofen. Denies chest pain, SOB, urinary s/s, changes in bowel habits. Patient has implanon birth control scheduled for removal in September of 2014, and until now has regular menstrual periods, with the most recent ending on 07/20/2012. Has never experienced these symptoms before.  Past Medical History  Diagnosis Date  . Hypertension   . UTI (urinary tract infection)     Past Surgical History  Procedure Date  . Cystectomy   . Wisdom tooth extraction   . Pilonidal cyst excision     No family history on file.  History  Substance Use Topics  . Smoking status: Current Every Day Smoker -- 0.2 packs/day    Types: Cigarettes  . Smokeless tobacco: Not on file     Comment: 4 CIGS A DAY  . Alcohol Use: No    OB History    Grav Para Term Preterm Abortions TAB SAB Ect Mult Living                  Review of Systems  Constitutional: Positive for fatigue.  Respiratory: Negative for cough.   Cardiovascular: Negative for chest pain.  Gastrointestinal: Positive for nausea and abdominal pain. Negative for diarrhea and constipation.       Has mild nausea after taking medication to relieve symptoms.  Genitourinary: Positive for vaginal bleeding. Negative for difficulty urinating and menstrual problem.    Musculoskeletal: Positive for back pain.  All other systems reviewed and are negative.    Allergies  Review of patient's allergies indicates no known allergies.  Home Medications  No current outpatient prescriptions on file.  BP 132/78  Pulse 73  Temp 98.7 F (37.1 C) (Oral)  Resp 20  SpO2 100%  LMP 07/10/2012  Physical Exam  Nursing note and vitals reviewed. Constitutional: She appears well-developed and well-nourished.  HENT:  Head: Normocephalic.  Eyes: Conjunctivae normal and EOM are normal. Pupils are equal, round, and reactive to light.  Neck: Normal range of motion. Neck supple.  Cardiovascular: Normal rate and normal heart sounds.   Pulmonary/Chest: Effort normal and breath sounds normal.  Abdominal: Soft. Bowel sounds are normal.  Genitourinary:       Dark red blood,  Minimal,   Tender cervix and adnexa,   Neurological: She is alert.  Skin: Skin is warm.  Psychiatric: She has a normal mood and affect.    ED Course  Procedures (including critical care time)  Labs Reviewed  URINALYSIS, ROUTINE W REFLEX MICROSCOPIC - Abnormal; Notable for the following:    Specific Gravity, Urine 1.031 (*)     Hgb urine dipstick LARGE (*)     All other components within normal limits  URINE MICROSCOPIC-ADD ON - Abnormal; Notable for the following:    Squamous Epithelial / LPF FEW (*)     All other components within normal limits  PREGNANCY, URINE   No results found.   1. Abnormal uterine bleeding       MDM  Pt given 2 hydrocodone without relief.   Pt reports no relief,   Pt given dilaudid and zofran IM.          Lonia Skinner [place], [place] 08/05/12 [phone]

## 2012-08-04 NOTE — ED Notes (Signed)
Had reg period Dec 24th- Jan 2nd- started having back pain last night, with severe abd pain,  Passing large clots - bright red- not like regular period. Has birth control implant -- for 2 1/2 years- has had reg periods for 2 years.

## 2012-08-05 LAB — GC/CHLAMYDIA PROBE AMP
CT Probe RNA: NEGATIVE
GC Probe RNA: NEGATIVE

## 2012-08-05 NOTE — ED Provider Notes (Signed)
Medical screening examination/treatment/procedure(s) were performed by non-physician practitioner and as supervising physician I was immediately available for consultation/collaboration.    Nelia Shi, MD 08/05/12 [phone]

## 2012-09-07 ENCOUNTER — Encounter (HOSPITAL_COMMUNITY): Payer: Self-pay | Admitting: *Deleted

## 2012-09-07 ENCOUNTER — Inpatient Hospital Stay (HOSPITAL_COMMUNITY)
Admission: AD | Admit: 2012-09-07 | Discharge: 2012-09-07 | Disposition: A | Discharge status: Home / Self Care | Payer: Medicaid Other | Source: Ambulatory Visit | Attending: Obstetrics & Gynecology | Admitting: Obstetrics & Gynecology

## 2012-09-07 DIAGNOSIS — N949 Unspecified condition associated with female genital organs and menstrual cycle: Secondary | ICD-10-CM | POA: Insufficient documentation

## 2012-09-07 DIAGNOSIS — M549 Dorsalgia, unspecified: Secondary | ICD-10-CM | POA: Insufficient documentation

## 2012-09-07 DIAGNOSIS — N938 Other specified abnormal uterine and vaginal bleeding: Secondary | ICD-10-CM

## 2012-09-07 DIAGNOSIS — R109 Unspecified abdominal pain: Secondary | ICD-10-CM | POA: Insufficient documentation

## 2012-09-07 DIAGNOSIS — R300 Dysuria: Secondary | ICD-10-CM | POA: Insufficient documentation

## 2012-09-07 LAB — URINE MICROSCOPIC-ADD ON

## 2012-09-07 LAB — URINALYSIS, ROUTINE W REFLEX MICROSCOPIC
Bilirubin Urine: NEGATIVE
Glucose, UA: NEGATIVE mg/dL
Ketones, ur: NEGATIVE mg/dL
Leukocytes, UA: NEGATIVE
Nitrite: NEGATIVE
Protein, ur: NEGATIVE mg/dL
Specific Gravity, Urine: >1.03 — ABNORMAL HIGH (ref 1.005–1.030)
Urobilinogen, UA: 0.2 mg/dL (ref 0.0–1.0)
pH: 6 (ref 5.0–8.0)

## 2012-09-07 LAB — WET PREP, GENITAL
Clue Cells Wet Prep HPF POC: NONE SEEN
Trich, Wet Prep: NONE SEEN
Yeast Wet Prep HPF POC: NONE SEEN

## 2012-09-07 LAB — POCT PREGNANCY, URINE: Preg Test, Ur: NEGATIVE

## 2012-09-07 MED ORDER — IBUPROFEN 800 MG PO TABS: 800.0000 mg | ORAL_TABLET | Freq: Three times a day (TID) | ORAL | Status: DC | PRN

## 2012-09-07 MED ORDER — KETOROLAC TROMETHAMINE 60 MG/2ML IM SOLN
60.0000 mg | Freq: Once | INTRAMUSCULAR | Status: AC
  Administered 2012-09-07: 60 mg via INTRAMUSCULAR
  Filled 2012-09-07: qty 2

## 2012-09-07 NOTE — MAU Note (Signed)
C/o intermittent vaginal bleeding for about 2.5 weeks; usually has a period every 2 months or so; Implanon for about 2.5 yrs; also c/o constant back pain & cramping for past 2 weeks; c/o chalky white vaginal sicharge also;

## 2012-09-07 NOTE — MAU Provider Note (Signed)
History    CSN: [id number]  Arrival date and time: 09/07/12 1209   First Provider Initiated Contact with Patient 09/07/12 1259      Chief Complaint  Patient presents with  . Vaginal Bleeding    HPI Stacy [name] is a 27 yo female who presents to the MAU complaining of vaginal bleeding x 2.5 weeks and lower back cramping. She reports that she has been bleeding off and on for the last few weeks, and reports that usually the bleeding is heavy, with "golf-ball sized clots". She reports that she has had the Implanon birth control for 2.5 years, and for the last year has been having a 3-4 day long period every 2 months. However, during the first year she had her Implanon she had no period at all. She reports that this recent onset of bleeding came on very quickly. Today she denies dizziness or lightheadedness.  The patient reports that she was seen at [facility] in January for this same problem, and reports that they did testing and found nothing abnormal. They did blood work, a pelvic exam and an [place], and all testing came back normal. She was discharged with pain medication for her back and pelvic pain and was also given 12 days of Provera. The patient reports that she took the Provera as prescribed but had no change in her bleeding pattern. She reports having a Pap 2 years ago that was normal. She reports one abnormal Pap when she was 27 yo, but states she has had at least 3 normal Paps since then. She used to be seen by Dr. Clearance Coots, however would like to seek her OBGYN care elsewhere.  She also complains of a white, chalky vaginal discharge that has worsened over the last 2 weeks. She denies foul odor or itching. She reports she had G/C and wet prep testing at her visit to [facility] that was negative, however reports the discharge is worse and would like to be tested. She also complains of a "tingling sensation" when urinating.  She reports that she would like to have the Implanon removed, but  would like to continue a type of birth control. She has tried Mirena in the past, however the device fell out multiple times. She is open to trying OCPs or Depo.   OB History   Grav Para Term Preterm Abortions TAB SAB Ect Mult Living   3 2 2  1 1    2       Past Medical History  Diagnosis Date  . Hypertension   . UTI (urinary tract infection)     Past Surgical History  Procedure Laterality Date  . Cystectomy    . Wisdom tooth extraction    . Pilonidal cyst excision      Family History  Problem Relation Age of Onset  . Alcohol abuse Neg Hx   . Arthritis Neg Hx   . Asthma Neg Hx   . Birth defects Neg Hx   . Cancer Neg Hx   . COPD Neg Hx   . Depression Neg Hx   . Diabetes Neg Hx   . Drug abuse Neg Hx   . Early death Neg Hx   . Hearing loss Neg Hx   . Heart disease Neg Hx   . Hyperlipidemia Neg Hx   . Hypertension Neg Hx   . Kidney disease Neg Hx   . Learning disabilities Neg Hx   . Mental illness Neg Hx   . Mental retardation Neg Hx   .  Miscarriages / Stillbirths Neg Hx   . Stroke Neg Hx   . Vision loss Neg Hx     History  Substance Use Topics  . Smoking status: Current Every Day Smoker -- 0.25 packs/day    Types: Cigarettes  . Smokeless tobacco: Not on file     Comment: 4 CIGS A DAY  . Alcohol Use: No    Allergies: No Known Allergies  Prescriptions prior to admission  Medication Sig Dispense Refill  . ibuprofen (ADVIL,MOTRIN) 200 MG tablet Take 400 mg by mouth every 6 (six) hours as needed. For pain      . medroxyPROGESTERone (PROVERA) 5 MG tablet Take 1 tablet (5 mg total) by mouth daily.  5 tablet  0    Review of Systems  Constitutional: Negative for fever.  Cardiovascular: Negative for chest pain and leg swelling.  Gastrointestinal: Positive for abdominal pain. Negative for nausea, vomiting, diarrhea and constipation.  Genitourinary: Positive for dysuria (patient reports a "tingling feeling"). Negative for urgency and frequency.  Neurological:  Negative for dizziness and headaches.    Physical Exam   Blood pressure 116/74, pulse 88, temperature 98.4 F (36.9 C), temperature source Oral, resp. rate 16, height 5\' 5"  (1.651 m), weight 78.926 kg (174 lb).  Physical Exam General appearance - alert, well appearing, and in no distress and oriented to person, place, and time Chest - clear to auscultation, no wheezes, rales or rhonchi, symmetric air entry Heart - normal rate, regular rhythm, normal S1, S2, no murmurs, rubs, clicks or gallops Abdomen - soft, mild pelvic tenderness, no rebound tenderness or guarding, nondistended, no masses or organomegaly Pelvic - VULVA: normal appearing vulva with no masses, tenderness or lesions, VAGINA: normal appearing vagina with normal color and discharge, no lesions, CERVIX: normal appearing cervix without discharge or lesions, UTERUS: uterus is normal size, shape, and consistency, mild pelvic tenderness, ADNEXA: normal adnexa in size, nontender and no masses Extremities - peripheral pulses normal, no pedal edema, no clubbing or cyanosis  MAU Course  Procedures  Results for orders placed during the hospital encounter of 09/07/12 (from the past 24 hour(s))  URINALYSIS, ROUTINE W REFLEX MICROSCOPIC     Status: Abnormal   Collection Time    09/07/12 12:15 PM      Result Value Range   Color, Urine YELLOW  YELLOW   APPearance CLEAR  CLEAR   Specific Gravity, Urine >1.030 (*) 1.005 - 1.030   pH 6.0  5.0 - 8.0   Glucose, UA NEGATIVE  NEGATIVE mg/dL   Hgb urine dipstick LARGE (*) NEGATIVE   Bilirubin Urine NEGATIVE  NEGATIVE   Ketones, ur NEGATIVE  NEGATIVE mg/dL   Protein, ur NEGATIVE  NEGATIVE mg/dL   Urobilinogen, UA 0.2  0.0 - 1.0 mg/dL   Nitrite NEGATIVE  NEGATIVE   Leukocytes, UA NEGATIVE  NEGATIVE  URINE MICROSCOPIC-ADD ON     Status: Abnormal   Collection Time    09/07/12 12:15 PM      Result Value Range   Squamous Epithelial / LPF FEW (*) RARE   RBC / HPF 3-6  <3 RBC/hpf   Bacteria,  UA RARE  RARE   Urine-Other MUCOUS PRESENT    POCT PREGNANCY, URINE     Status: None   Collection Time    09/07/12 12:23 PM      Result Value Range   Preg Test, Ur NEGATIVE  NEGATIVE  WET PREP, GENITAL     Status: Abnormal   Collection Time  09/07/12  1:48 PM      Result Value Range   Yeast Wet Prep HPF POC NONE SEEN  NONE SEEN   Trich, Wet Prep NONE SEEN  NONE SEEN   Clue Cells Wet Prep HPF POC NONE SEEN  NONE SEEN   WBC, Wet Prep HPF POC FEW (*) NONE SEEN    Assessment and Plan  Assessment: Panayiota Larkin is a 27 yo female who presents for vaginal bleeding x2 weeks, abdominal/back pain, and increased vaginal discharge. Patient also complains of a "tingling sensation" while urinating.  Patient has had the Implanon for 2.5 years and has had irregular bleeding since it was placed. Patient currently has no annual OBGYN care.   Plan: 1. Vaginal bleeding- Patient given number to make a follow-up appointment with [facility] for removal of the Implanon. Discussed with patient that it will be important to discuss other contraceptive options at her appointment. 2. Abdominal/Back pain- Patient given instructions on correct dosing of Motrin. Given shot of Toradal in MAU to control pain. 3. Vaginal discharge- G/C sent, wet prep negative 4. Dysuria- no UTI per urinalysis   Adela Glimpse 09/07/2012, 1:06 PM   Bleeding likely side effect of Implanon. Plan to f/u in GYN clinic.   I saw and examined patient along with student and agree with above note.   FRAZIER,NATALIE 09/10/2012 10:23 PM

## 2012-09-07 NOTE — MAU Note (Signed)
Desiring to be checked for STDs;

## 2012-09-10 LAB — GC/CHLAMYDIA PROBE AMP
CT Probe RNA: UNDETERMINED
GC Probe RNA: NEGATIVE

## 2012-09-11 NOTE — MAU Provider Note (Signed)

## 2012-09-26 ENCOUNTER — Ambulatory Visit (INDEPENDENT_AMBULATORY_CARE_PROVIDER_SITE_OTHER): Payer: Medicaid Other | Admitting: Obstetrics & Gynecology

## 2012-09-26 ENCOUNTER — Encounter: Payer: Self-pay | Admitting: Obstetrics & Gynecology

## 2012-09-26 VITALS — BP 127/88 | HR 80 | Temp 98.6°F | Ht 65.0 in | Wt 171.6 lb

## 2012-09-26 DIAGNOSIS — Z309 Encounter for contraceptive management, unspecified: Secondary | ICD-10-CM

## 2012-09-26 NOTE — Progress Notes (Signed)
Desires removal of implanon, start new contraception, and HIV testing and pap testing

## 2012-09-26 NOTE — Progress Notes (Signed)
  Subjective:    Patient ID: Stacy [name], female    DOB: February 13, 1986, 27 y.o.   MRN: [id number]  HPI  Ms. Pablo Lawrence is here today because she would like to have her Nexplanon removed. It has been in for 2 1/2 year and she has been having irregular bleeding. She wants to wait for about a month before considering any other forms of birth control. She is not sexually now since she broke up with her boyfriend about 3 weeks ago. She understands that she will be fertile once the Nexplanon is removed.  Review of Systems     Objective:   Physical Exam  Consent was signed and a time out was done. Her left arm was prepped with betadine and infiltrated with 1% lidocaine (2 cc). A small incision was made and the Nexplanon was easily removed and noted to be intact. She tolerated the procedure well.      Assessment & Plan:  Contraception management. She will use condoms prn. RTC 1 month for annual

## 2012-09-28 ENCOUNTER — Encounter: Payer: Self-pay | Admitting: *Deleted

## 2012-10-24 ENCOUNTER — Other Ambulatory Visit (HOSPITAL_COMMUNITY)
Admission: RE | Admit: 2012-10-24 | Discharge: 2012-10-24 | Disposition: A | Discharge status: Home / Self Care | Payer: Medicaid Other | Source: Ambulatory Visit | Attending: Obstetrics & Gynecology | Admitting: Obstetrics & Gynecology

## 2012-10-24 ENCOUNTER — Ambulatory Visit (INDEPENDENT_AMBULATORY_CARE_PROVIDER_SITE_OTHER): Payer: Medicaid Other | Admitting: Obstetrics & Gynecology

## 2012-10-24 ENCOUNTER — Encounter: Payer: Self-pay | Admitting: Obstetrics & Gynecology

## 2012-10-24 VITALS — BP 122/72 | Temp 98.6°F | Ht 65.0 in | Wt 172.0 lb

## 2012-10-24 DIAGNOSIS — Z113 Encounter for screening for infections with a predominantly sexual mode of transmission: Secondary | ICD-10-CM | POA: Insufficient documentation

## 2012-10-24 DIAGNOSIS — Z0000 Encounter for general adult medical examination without abnormal findings: Secondary | ICD-10-CM

## 2012-10-24 DIAGNOSIS — Z01419 Encounter for gynecological examination (general) (routine) without abnormal findings: Secondary | ICD-10-CM

## 2012-10-24 LAB — OB RESULTS CONSOLE GC/CHLAMYDIA
Chlamydia: NEGATIVE
Gonorrhea: NEGATIVE

## 2012-10-24 LAB — POCT PREGNANCY, URINE: Preg Test, Ur: POSITIVE — AB

## 2012-10-24 MED ORDER — PRENATAL CA CARB-B6-B12-FA 1 MG PO TABS: 1.0000 | ORAL_TABLET | Freq: Every morning | ORAL | Status: DC

## 2012-10-24 NOTE — Progress Notes (Signed)
Patient is concerned about her increased appetite.  Requested UPT.  Results = +

## 2012-10-24 NOTE — Progress Notes (Signed)
Patient ID: Stacy [name], female   DOB: 1986/04/13, 27 y.o.   MRN: [id number] Subjective:    Stacy [name] is a 27 y.o. female who presents for an annual exam. She is considering what type of birth control she wants. She had her Implanon removed on 09-27-12. She says that she has been using condoms. She does report an increased appetite. The patient has no complaints today. The patient is sexually active. GYN screening history: last pap: was normal. The patient wears seatbelts: yes. The patient participates in regular exercise: yes. Has the patient ever been transfused or tattooed?: yes. (tattoo)The patient reports that there is not domestic violence in her life.   Menstrual History: OB History   Grav Para Term Preterm Abortions TAB SAB Ect Mult Living   3 2 2  1 1    2       Menarche age: 62  Patient's last menstrual period was 09/15/2012.    The following portions of the patient's history were reviewed and updated as appropriate: allergies, current medications, past family history, past medical history, past social history, past surgical history and problem list.  Review of Systems A comprehensive review of systems was negative. 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 She lives with her boyfriend of 9 years.   Objective:    BP 122/72  Temp(Src) 98.6 F (37 C) (Oral)  Ht 5\' 5"  (1.651 m)  Wt 172 lb (78.019 kg)  BMI 28.62 kg/m2  LMP 09/15/2012  General Appearance:    Alert, cooperative, no distress, appears stated age  Head:    Normocephalic, without obvious abnormality, atraumatic  Eyes:    PERRL, conjunctiva/corneas clear, EOM's intact, fundi    benign, both eyes  Ears:    Normal TM's and external ear canals, both ears  Nose:   Nares normal, septum midline, mucosa normal, no drainage    or sinus tenderness  Throat:   Lips, mucosa, and tongue normal; teeth and gums normal  Neck:   Supple, symmetrical, trachea midline, no adenopathy;    thyroid:  no  enlargement/tenderness/nodules; no carotid   bruit or JVD  Back:     Symmetric, no curvature, ROM normal, no CVA tenderness  Lungs:     Clear to auscultation bilaterally, respirations unlabored  Chest Wall:    No tenderness or deformity   Heart:    Regular rate and rhythm, S1 and S2 normal, no murmur, rub   or gallop  Breast Exam:    No tenderness, masses, or nipple abnormality  Abdomen:     Soft, non-tender, bowel sounds active all four quadrants,    no masses, no organomegaly  Genitalia:    Normal female without lesion, discharge or tenderness  Rectal:    Normal tone, normal prostate, no masses or tenderness;   guaiac negative stool  Extremities:   Extremities normal, atraumatic, no cyanosis or edema  Pulses:   2+ and symmetric all extremities  Skin:   Skin color, texture, turgor normal, no rashes or lesions  Lymph nodes:   Cervical, supraclavicular, and axillary nodes normal  Neurologic:   CNII-XII intact, normal strength, sensation and reflexes    throughout  .    Assessment:    Healthy female exam.  Positive pregnancy test today ( must be less than [redacted] weeks EGA)   Plan:     Chlamydia specimen. GC specimen. Thin prep Pap smear.  Start folic acid/MVI today Seek prenatal care

## 2012-11-30 ENCOUNTER — Emergency Department (HOSPITAL_COMMUNITY)
Admission: EM | Admit: 2012-11-30 | Discharge: 2012-11-30 | Discharge status: Against Medical Advice | Payer: Medicaid Other | Attending: Emergency Medicine | Admitting: Emergency Medicine

## 2012-11-30 DIAGNOSIS — Z029 Encounter for administrative examinations, unspecified: Secondary | ICD-10-CM | POA: Insufficient documentation

## 2012-11-30 DIAGNOSIS — F17200 Nicotine dependence, unspecified, uncomplicated: Secondary | ICD-10-CM | POA: Insufficient documentation

## 2012-11-30 NOTE — ED Notes (Signed)
Called for triage x2 no answer

## 2012-11-30 NOTE — ED Notes (Signed)
Pt called from wait with no answer for traige

## 2012-12-05 ENCOUNTER — Encounter: Payer: Self-pay | Admitting: Advanced Practice Midwife

## 2012-12-05 ENCOUNTER — Ambulatory Visit (INDEPENDENT_AMBULATORY_CARE_PROVIDER_SITE_OTHER): Payer: Medicaid Other | Admitting: Advanced Practice Midwife

## 2012-12-05 ENCOUNTER — Other Ambulatory Visit: Payer: Self-pay | Admitting: Advanced Practice Midwife

## 2012-12-05 VITALS — BP 117/77 | Temp 99.1°F | Wt 164.6 lb

## 2012-12-05 DIAGNOSIS — Z3481 Encounter for supervision of other normal pregnancy, first trimester: Secondary | ICD-10-CM

## 2012-12-05 DIAGNOSIS — Z3490 Encounter for supervision of normal pregnancy, unspecified, unspecified trimester: Secondary | ICD-10-CM | POA: Insufficient documentation

## 2012-12-05 DIAGNOSIS — Z3480 Encounter for supervision of other normal pregnancy, unspecified trimester: Secondary | ICD-10-CM

## 2012-12-05 DIAGNOSIS — Z3682 Encounter for antenatal screening for nuchal translucency: Secondary | ICD-10-CM

## 2012-12-05 DIAGNOSIS — Z1389 Encounter for screening for other disorder: Secondary | ICD-10-CM

## 2012-12-05 LAB — POCT URINALYSIS DIP (DEVICE)
Bilirubin Urine: NEGATIVE
Glucose, UA: NEGATIVE mg/dL
Hgb urine dipstick: NEGATIVE
Ketones, ur: NEGATIVE mg/dL
Leukocytes, UA: NEGATIVE
Nitrite: NEGATIVE
Protein, ur: 30 mg/dL — AB
Specific Gravity, Urine: 1.015 (ref 1.005–1.030)
Urobilinogen, UA: 2 mg/dL — ABNORMAL HIGH (ref 0.0–1.0)
pH: 8.5 — ABNORMAL HIGH (ref 5.0–8.0)

## 2012-12-05 LAB — HIV ANTIBODY (ROUTINE TESTING W REFLEX): HIV: NONREACTIVE

## 2012-12-05 MED ORDER — PYRIDOXINE HCL 50 MG PO TABS: 50.0000 mg | ORAL_TABLET | Freq: Every day | ORAL | Status: DC

## 2012-12-05 MED ORDER — PRENATAL VITAMINS 28-0.8 MG PO TABS: 1.0000 | ORAL_TABLET | Freq: Every day | ORAL | Status: DC

## 2012-12-05 MED ORDER — DOXYLAMINE SUCCINATE (SLEEP) 25 MG PO TABS: 25.0000 mg | ORAL_TABLET | Freq: Every evening | ORAL | Status: DC | PRN

## 2012-12-05 MED ORDER — ONDANSETRON HCL 4 MG PO TABS: 4.0000 mg | ORAL_TABLET | Freq: Every day | ORAL | Status: DC | PRN

## 2012-12-05 NOTE — Progress Notes (Signed)
New OB done See other note  Subjective:    Stacy [name] is a [id number] [redacted]w[redacted]d being seen today for her first obstetrical visit.  Her obstetrical history is significant for conception just after implant removed.. Patient does intend to breast feed. Pregnancy history fully reviewed.  Patient reports no complaints.  Filed Vitals:   12/05/12 1012  BP: 117/77  Temp: 99.1 F (37.3 C)  Weight: 74.662 kg (164 lb 9.6 oz)    HISTORY: OB History   Grav Para Term Preterm Abortions TAB SAB Ect Mult Living   4 2 2  1 1    2      # Outc Date GA Lbr Len/2nd Wgt Sex Del Anes PTL Lv   1 TRM 6/07 [redacted]w[redacted]d  4.540JW(1XB1YN) F SVD EPI No Yes   2 TRM 4/11 [redacted]w[redacted]d  3.26kg(7lb3oz) F SVD EPI  Yes   3 TAB            4 CUR              Past Medical History  Diagnosis Date  . UTI (urinary tract infection)   . Medical history non-contributory    Past Surgical History  Procedure Laterality Date  . Cystectomy    . Wisdom tooth extraction    . Pilonidal cyst excision     Family History  Problem Relation Age of Onset  . Alcohol abuse Neg Hx   . Arthritis Neg Hx   . Asthma Neg Hx   . Birth defects Neg Hx   . Cancer Neg Hx   . COPD Neg Hx   . Depression Neg Hx   . Diabetes Neg Hx   . Drug abuse Neg Hx   . Early death Neg Hx   . Hearing loss Neg Hx   . Heart disease Neg Hx   . Hyperlipidemia Neg Hx   . Hypertension Neg Hx   . Kidney disease Neg Hx   . Learning disabilities Neg Hx   . Mental illness Neg Hx   . Mental retardation Neg Hx   . Miscarriages / Stillbirths Neg Hx   . Stroke Neg Hx   . Vision loss Neg Hx      Exam    Uterus:  Fundal Height: 12 cm  Pelvic Exam:    Perineum: Not evaluated, just had pelvic exam   Vulva: n/a   Vagina:  n/a   pH: n/a   Cervix: n/a   Adnexa: not evaluated   Bony Pelvis: gynecoid  System: Breast:  normal appearance, no masses or tenderness   Skin: normal coloration and turgor, no rashes    Neurologic: oriented, grossly non-focal   Extremities: normal strength, tone, and muscle mass   HEENT neck supple with midline trachea   Mouth/Teeth mucous membranes moist, pharynx normal without lesions   Neck supple and no masses   Cardiovascular: regular rate and rhythm, no murmurs or gallops   Respiratory:  appears well, vitals normal, no respiratory distress, acyanotic, normal RR, ear and throat exam is normal, neck free of mass or lymphadenopathy, chest clear, no wheezing, crepitations, rhonchi, normal symmetric air entry   Abdomen: soft, non-tender; bowel sounds normal; no masses,  no organomegaly   Urinary: urethral meatus normal      Assessment:    Pregnancy: [id number] Patient Active Problem List   Diagnosis Date Noted  . History of pilonidal cyst 03/17/2011  . Pilonidal cyst with abscess 02/01/2011        Plan:  Initial labs drawn. Prenatal vitamins. Problem list reviewed and updated. Genetic Screening discussed First Screen: ordered.  Ultrasound discussed; fetal survey: requested.  Follow up in 4 weeks. 50% of 30 min visit spent on counseling and coordination of care.  Will schedule with MFM for first screen Rx Unisom/B6 and PRN zofran with constipation precautions   [facility] 12/05/2012

## 2012-12-05 NOTE — Patient Instructions (Signed)
Pregnancy - First Trimester  During sexual intercourse, millions of sperm go into the vagina. Only 1 sperm will penetrate and fertilize the female egg while it is in the Fallopian tube. One week later, the fertilized egg implants into the wall of the uterus. An embryo begins to develop into a baby. At 6 to 8 weeks, the eyes and face are formed and the heartbeat can be seen on ultrasound. At the end of 12 weeks (first trimester), all the baby's organs are formed. Now that you are pregnant, you will want to do everything you can to have a healthy baby. Two of the most important things are to get good prenatal care and follow your caregiver's instructions. Prenatal care is all the medical care you receive before the baby's birth. It is given to prevent, find, and treat problems during the pregnancy and childbirth.  PRENATAL EXAMS  · During prenatal visits, your weight, blood pressure, and urine are checked. This is done to make sure you are healthy and progressing normally during the pregnancy.  · A pregnant woman should gain 25 to 35 pounds during the pregnancy. However, if you are overweight or underweight, your caregiver will advise you regarding your weight.  · Your caregiver will ask and answer questions for you.  · Blood work, cervical cultures, other necessary tests, and a Pap test are done during your prenatal exams. These tests are done to check on your health and the probable health of your baby. Tests are strongly recommended and done for HIV with your permission. This is the virus that causes AIDS. These tests are done because medicines can be given to help prevent your baby from being born with this infection should you have been infected without knowing it. Blood work is also used to find out your blood type, previous infections, and follow your blood levels (hemoglobin).  · Low hemoglobin (anemia) is common during pregnancy. Iron and vitamins are given to help prevent this. Later in the pregnancy, blood  tests for diabetes will be done along with any other tests if any problems develop.  · You may need other tests to make sure you and the baby are doing well.  CHANGES DURING THE FIRST TRIMESTER   Your body goes through many changes during pregnancy. They vary from person to person. Talk to your caregiver about changes you notice and are concerned about. Changes can include:  · Your menstrual period stops.  · The egg and sperm carry the genes that determine what you look like. Genes from you and your partner are forming a baby. The female genes determine whether the baby is a boy or a girl.  · Your body increases in girth and you may feel bloated.  · Feeling sick to your stomach (nauseous) and throwing up (vomiting). If the vomiting is uncontrollable, call your caregiver.  · Your breasts will begin to enlarge and become tender.  · Your nipples may stick out more and become darker.  · The need to urinate more. Painful urination may mean you have a bladder infection.  · Tiring easily.  · Loss of appetite.  · Cravings for certain kinds of food.  · At first, you may gain or lose a couple of pounds.  · You may have changes in your emotions from day to day (excited to be pregnant or concerned something may go wrong with the pregnancy and baby).  · You may have more vivid and strange dreams.  HOME CARE INSTRUCTIONS   ·   It is very important to avoid all smoking, alcohol and non-prescribed drugs during your pregnancy. These affect the formation and growth of the baby. Avoid chemicals while pregnant to ensure the delivery of a healthy infant.  · Start your prenatal visits by the 12th week of pregnancy. They are usually scheduled monthly at first, then more often in the last 2 months before delivery. Keep your caregiver's appointments. Follow your caregiver's instructions regarding medicine use, blood and lab tests, exercise, and diet.  · During pregnancy, you are providing food for you and your baby. Eat regular, well-balanced  meals. Choose foods such as meat, fish, milk and other low fat dairy products, vegetables, fruits, and whole-grain breads and cereals. Your caregiver will tell you of the ideal weight gain.  · You can help morning sickness by keeping soda crackers at the bedside. Eat a couple before arising in the morning. You may want to use the crackers without salt on them.  · Eating 4 to 5 small meals rather than 3 large meals a day also may help the nausea and vomiting.  · Drinking liquids between meals instead of during meals also seems to help nausea and vomiting.  · A physical sexual relationship may be continued throughout pregnancy if there are no other problems. Problems may be early (premature) leaking of amniotic fluid from the membranes, vaginal bleeding, or belly (abdominal) pain.  · Exercise regularly if there are no restrictions. Check with your caregiver or physical therapist if you are unsure of the safety of some of your exercises. Greater weight gain will occur in the last 2 trimesters of pregnancy. Exercising will help:  · Control your weight.  · Keep you in shape.  · Prepare you for labor and delivery.  · Help you lose your pregnancy weight after you deliver your baby.  · Wear a good support or jogging bra for breast tenderness during pregnancy. This may help if worn during sleep too.  · Ask when prenatal classes are available. Begin classes when they are offered.  · Do not use hot tubs, steam rooms, or saunas.  · Wear your seat belt when driving. This protects you and your baby if you are in an accident.  · Avoid raw meat, uncooked cheese, cat litter boxes, and soil used by cats throughout the pregnancy. These carry germs that can cause birth defects in the baby.  · The first trimester is a good time to visit your dentist for your dental health. Getting your teeth cleaned is okay. Use a softer toothbrush and brush gently during pregnancy.  · Ask for help if you have financial, counseling, or nutritional needs  during pregnancy. Your caregiver will be able to offer counseling for these needs as well as refer you for other special needs.  · Do not take any medicines or herbs unless told by your caregiver.  · Inform your caregiver if there is any mental or physical domestic violence.  · Make a list of emergency phone numbers of family, friends, hospital, and police and fire departments.  · Write down your questions. Take them to your prenatal visit.  · Do not douche.  · Do not cross your legs.  · If you have to stand for long periods of time, rotate you feet or take small steps in a circle.  · You may have more vaginal secretions that may require a sanitary pad. Do not use tampons or scented sanitary pads.  MEDICINES AND DRUG USE IN PREGNANCY  ·   Take prenatal vitamins as directed. The vitamin should contain 1 milligram of folic acid. Keep all vitamins out of reach of children. Only a couple vitamins or tablets containing iron may be fatal to a baby or young child when ingested.  · Avoid use of all medicines, including herbs, over-the-counter medicines, not prescribed or suggested by your caregiver. Only take over-the-counter or prescription medicines for pain, discomfort, or fever as directed by your caregiver. Do not use aspirin, ibuprofen, or naproxen unless directed by your caregiver.  · Let your caregiver also know about herbs you may be using.  · Alcohol is related to a number of birth defects. This includes fetal alcohol syndrome. All alcohol, in any form, should be avoided completely. Smoking will cause low birth rate and premature babies.  · Street or illegal drugs are very harmful to the baby. They are absolutely forbidden. A baby born to an addicted mother will be addicted at birth. The baby will go through the same withdrawal an adult does.  · Let your caregiver know about any medicines that you have to take and for what reason you take them.  SEEK MEDICAL CARE IF:   You have any concerns or worries during your  pregnancy. It is better to call with your questions if you feel they cannot wait, rather than worry about them.  SEEK IMMEDIATE MEDICAL CARE IF:   · An unexplained oral temperature above 102° F (38.9° C) develops, or as your caregiver suggests.  · You have leaking of fluid from the vagina (birth canal). If leaking membranes are suspected, take your temperature and inform your caregiver of this when you call.  · There is vaginal spotting or bleeding. Notify your caregiver of the amount and how many pads are used.  · You develop a bad smelling vaginal discharge with a change in the color.  · You continue to feel sick to your stomach (nauseated) and have no relief from remedies suggested. You vomit blood or coffee ground-like materials.  · You lose more than 2 pounds of weight in 1 week.  · You gain more than 2 pounds of weight in 1 week and you notice swelling of your face, hands, feet, or legs.  · You gain 5 pounds or more in 1 week (even if you do not have swelling of your hands, face, legs, or feet).  · You get exposed to German measles and have never had them.  · You are exposed to fifth disease or chickenpox.  · You develop belly (abdominal) pain. Round ligament discomfort is a common non-cancerous (benign) cause of abdominal pain in pregnancy. Your caregiver still must evaluate this.  · You develop headache, fever, diarrhea, pain with urination, or shortness of breath.  · You fall or are in a car accident or have any kind of trauma.  · There is mental or physical violence in your home.  Document Released: 06/28/2001 Document Revised: 03/28/2012 Document Reviewed: 12/30/2008  [employer]® Patient Information ©2014 [employer].

## 2012-12-05 NOTE — Progress Notes (Signed)
Pulse: 86 Had pap and cultures at last visit.  Unsure lmp, knows it was around 09/15/12 but isn't sure of exact date.  Has trouble sleeping. Some pelvic pressure.  Would like something for nausea, needs pnv rx.  Also wants to know if she can get something to help her sleep.  Wants to know if hep b shots are safe during pregnancy.

## 2012-12-06 LAB — OBSTETRIC PANEL
Antibody Screen: NEGATIVE
Basophils Absolute: 0 10*3/uL (ref 0.0–0.1)
Basophils Relative: 0 % (ref 0–1)
Eosinophils Absolute: 0.1 10*3/uL (ref 0.0–0.7)
Eosinophils Relative: 1 % (ref 0–5)
HCT: 36.5 % (ref 36.0–46.0)
Hemoglobin: 12.8 g/dL (ref 12.0–15.0)
Hepatitis B Surface Ag: NEGATIVE
Lymphocytes Relative: 24 % (ref 12–46)
Lymphs Abs: 2.9 10*3/uL (ref 0.7–4.0)
MCH: 30.5 pg (ref 26.0–34.0)
MCHC: 35.1 g/dL (ref 30.0–36.0)
MCV: 86.9 fL (ref 78.0–100.0)
Monocytes Absolute: 0.7 10*3/uL (ref 0.1–1.0)
Monocytes Relative: 6 % (ref 3–12)
Neutro Abs: 8 10*3/uL — ABNORMAL HIGH (ref 1.7–7.7)
Neutrophils Relative %: 69 % (ref 43–77)
Platelets: 335 10*3/uL (ref 150–400)
RBC: 4.2 MIL/uL (ref 3.87–5.11)
RDW: 14 % (ref 11.5–15.5)
Rh Type: POSITIVE
Rubella: 1.25 Index — ABNORMAL HIGH (ref <0.90)
WBC: 11.8 10*3/uL — ABNORMAL HIGH (ref 4.0–10.5)

## 2012-12-07 ENCOUNTER — Other Ambulatory Visit: Payer: Self-pay | Admitting: Advanced Practice Midwife

## 2012-12-07 LAB — HEMOGLOBINOPATHY EVALUATION
Hemoglobin Other: 0 %
Hgb A2 Quant: 2.6 % (ref 2.2–3.2)
Hgb A: 97.1 % (ref 96.8–97.8)
Hgb F Quant: 0.3 % (ref 0.0–2.0)
Hgb S Quant: 0 %

## 2012-12-07 MED ORDER — CEPHALEXIN 500 MG PO CAPS: 500.0000 mg | ORAL_CAPSULE | Freq: Four times a day (QID) | ORAL | Status: DC

## 2012-12-08 LAB — CULTURE, OB URINE: Colony Count: >=100000

## 2012-12-10 ENCOUNTER — Encounter (HOSPITAL_COMMUNITY): Payer: Self-pay | Admitting: *Deleted

## 2012-12-10 ENCOUNTER — Inpatient Hospital Stay (HOSPITAL_COMMUNITY)
Admission: AD | Admit: 2012-12-10 | Discharge: 2012-12-10 | Disposition: A | Discharge status: Home / Self Care | Payer: Medicaid Other | Source: Ambulatory Visit | Attending: Obstetrics & Gynecology | Admitting: Obstetrics & Gynecology

## 2012-12-10 ENCOUNTER — Other Ambulatory Visit: Payer: Self-pay | Admitting: Advanced Practice Midwife

## 2012-12-10 DIAGNOSIS — O2341 Unspecified infection of urinary tract in pregnancy, first trimester: Secondary | ICD-10-CM

## 2012-12-10 DIAGNOSIS — O2390 Unspecified genitourinary tract infection in pregnancy, unspecified trimester: Secondary | ICD-10-CM | POA: Insufficient documentation

## 2012-12-10 DIAGNOSIS — R0602 Shortness of breath: Secondary | ICD-10-CM | POA: Insufficient documentation

## 2012-12-10 DIAGNOSIS — R109 Unspecified abdominal pain: Secondary | ICD-10-CM | POA: Insufficient documentation

## 2012-12-10 DIAGNOSIS — Z1389 Encounter for screening for other disorder: Secondary | ICD-10-CM

## 2012-12-10 DIAGNOSIS — N390 Urinary tract infection, site not specified: Secondary | ICD-10-CM | POA: Insufficient documentation

## 2012-12-10 DIAGNOSIS — N949 Unspecified condition associated with female genital organs and menstrual cycle: Secondary | ICD-10-CM

## 2012-12-10 LAB — URINALYSIS, ROUTINE W REFLEX MICROSCOPIC
Bilirubin Urine: NEGATIVE
Glucose, UA: NEGATIVE mg/dL
Hgb urine dipstick: NEGATIVE
Ketones, ur: 15 mg/dL — AB
Leukocytes, UA: NEGATIVE
Nitrite: NEGATIVE
Protein, ur: NEGATIVE mg/dL
Specific Gravity, Urine: >1.03 — ABNORMAL HIGH (ref 1.005–1.030)
Urobilinogen, UA: 1 mg/dL (ref 0.0–1.0)
pH: 7 (ref 5.0–8.0)

## 2012-12-10 MED ORDER — ACETAMINOPHEN 500 MG PO TABS
1000.0000 mg | ORAL_TABLET | Freq: Once | ORAL | Status: AC
  Administered 2012-12-10: 1000 mg via ORAL
  Filled 2012-12-10: qty 2

## 2012-12-10 MED ORDER — ALBUTEROL SULFATE HFA 108 (90 BASE) MCG/ACT IN AERS
1.0000 | INHALATION_SPRAY | Freq: Once | RESPIRATORY_TRACT | Status: AC
  Administered 2012-12-10: 2 via RESPIRATORY_TRACT
  Filled 2012-12-10: qty 6.7

## 2012-12-10 MED ORDER — CEPHALEXIN 500 MG PO CAPS: 500.0000 mg | ORAL_CAPSULE | Freq: Four times a day (QID) | ORAL | Status: DC

## 2012-12-10 MED ORDER — ALBUTEROL SULFATE HFA 108 (90 BASE) MCG/ACT IN AERS: 2.0000 | INHALATION_SPRAY | Freq: Four times a day (QID) | RESPIRATORY_TRACT | Status: DC | PRN

## 2012-12-10 NOTE — MAU Provider Note (Signed)
Chief Complaint: Abdominal Cramping and Shortness of Breath   First Provider Initiated Contact with Patient 12/10/12 1437     SUBJECTIVE HPI: Stacy [name] is a 27 y.o. Z6X0960 at 108w2d by LMP who presents with One to 2 week history of bilateral groin pain that is constant but waxes and wanes and is worse with walking. She also has shortness of breath which began when she woke this morning. She describes the sensation of not being able to get a full breath. Denies chest pain, cough, upper respiratory symptoms, chills, fever, malaise. No previous similar episodes and no history of asthma. Denies respiratory allergies. Escherichia coli ASB diagnosed one week ago [facility] and she has not filled Keflex prescription yet.  Past Medical History  Diagnosis Date  . UTI (urinary tract infection)   . Medical history non-contributory    OB History   Grav Para Term Preterm Abortions TAB SAB Ect Mult Living   4 2 2  1 1    2      # Outc Date GA Lbr Len/2nd Wgt Sex Del Anes PTL Lv   1 TRM 6/07 [redacted]w[redacted]d  4.540JW(1XB1YN) F SVD EPI No Yes   2 TRM 4/11 [redacted]w[redacted]d  3.26kg(7lb3oz) F SVD EPI  Yes   3 TAB            4 CUR              Past Surgical History  Procedure Laterality Date  . Cystectomy    . Wisdom tooth extraction    . Pilonidal cyst excision     History   Social History  . Marital Status: Single    Spouse Name: N/A    Number of Children: N/A  . Years of Education: N/A   Occupational History  . Not on file.   Social History Main Topics  . Smoking status: Former Smoker -- 0.25 packs/day    Types: Cigarettes  . Smokeless tobacco: Never Used     Comment: 4 CIGS A DAY- Stopped smoking when she had positive upt  . Alcohol Use: No  . Drug Use: No  . Sexually Active: Yes    Birth Control/ Protection: None   Other Topics Concern  . Not on file   Social History Narrative  . No narrative on file   No current facility-administered medications on file prior to encounter.    Current Outpatient Prescriptions on File Prior to Encounter  Medication Sig Dispense Refill  . doxylamine, Sleep, (UNISOM) 25 MG tablet Take 1 tablet (25 mg total) by mouth at bedtime as needed for sleep.  30 tablet  0  . ondansetron (ZOFRAN) 4 MG tablet Take 1 tablet (4 mg total) by mouth daily as needed for nausea.  30 tablet  1  . Prenatal Vit-Fe Fumarate-FA (PRENATAL VITAMINS) 28-0.8 MG TABS Take 1 tablet by mouth daily.  30 tablet  12  . pyridOXINE (B-6) 50 MG tablet Take 1 tablet (50 mg total) by mouth daily.  90 tablet  3  . cephALEXin (KEFLEX) 500 MG capsule Take 1 capsule (500 mg total) by mouth 4 (four) times daily.  28 capsule  0   No Known Allergies  ROS: Pertinent items in HPI  OBJECTIVE Blood pressure 103/64, pulse 96, temperature 98.6 F (37 C), temperature source Oral, resp. rate 16, height 5\' 5"  (1.651 m), weight 74.844 kg (165 lb), last menstrual period 09/15/2012, SpO2 100.00%. GENERAL: Well-developed, well-nourished female in no acute distress.  HEENT: Normocephalic HEART:  normal rate RESP: normal effort; lungs with coarse breath sounds essentially clear bilaterally except occasional scattered expiratory wheeze, good air movement throughout ABDOMEN: Soft, Minimally tender bilaterally groin; DT FHR 165 EXTREMITIES: Nontender, no edema NEURO: Alert and oriented  LAB RESULTS Results for orders placed during the hospital encounter of 12/10/12 (from the past 24 hour(s))  URINALYSIS, ROUTINE W REFLEX MICROSCOPIC     Status: Abnormal   Collection Time    12/10/12  2:15 PM      Result Value Range   Color, Urine YELLOW  YELLOW   APPearance CLEAR  CLEAR   Specific Gravity, Urine >1.030 (*) 1.005 - 1.030   pH 7.0  5.0 - 8.0   Glucose, UA NEGATIVE  NEGATIVE mg/dL   Hgb urine dipstick NEGATIVE  NEGATIVE   Bilirubin Urine NEGATIVE  NEGATIVE   Ketones, ur 15 (*) NEGATIVE mg/dL   Protein, ur NEGATIVE  NEGATIVE mg/dL   Urobilinogen, UA 1.0  0.0 - 1.0 mg/dL   Nitrite  NEGATIVE  NEGATIVE   Leukocytes, UA NEGATIVE  NEGATIVE    MAU COURSE Albuterol inhaler 2 puffs with resolution of SOB  ASSESSMENT 1. Round ligament pain   2. UTI (lower urinary tract infection)   3. Shortness of breath   P2012 at [redacted]w[redacted]d   PLAN Discharge home See AVS on UTI and RLP   Medication List    TAKE these medications       albuterol 108 (90 BASE) MCG/ACT inhaler  Commonly known as:  PROVENTIL HFA;VENTOLIN HFA  Inhale 2 puffs into the lungs every 6 (six) hours as needed for wheezing.     cephALEXin 500 MG capsule  Commonly known as:  KEFLEX  Take 1 capsule (500 mg total) by mouth 4 (four) times daily.     doxylamine (Sleep) 25 MG tablet  Commonly known as:  UNISOM  Take 1 tablet (25 mg total) by mouth at bedtime as needed for sleep.     ondansetron 4 MG tablet  Commonly known as:  ZOFRAN  Take 1 tablet (4 mg total) by mouth daily as needed for nausea.     Prenatal Vitamins 28-0.8 MG Tabs  Take 1 tablet by mouth daily.     pyridOXINE 50 MG tablet  Commonly known as:  B-6  Take 1 tablet (50 mg total) by mouth daily.       Follow-up Information   Follow up with [facility]. (Keep appointment at [facility])       Danae Orleans, CNM 12/10/2012  2:57 PM

## 2012-12-10 NOTE — MAU Note (Signed)
Patient states she has been having abdominal cramping for about one week. States she started having difficulty breathing last night. Denies bleeding or discharge. Respirations in triage regular and even with an O2sat of 100%.

## 2012-12-10 NOTE — Progress Notes (Signed)
Pt. States she is not taking Keflex as listed. States she did not know it was called in or what it is for.

## 2012-12-11 ENCOUNTER — Telehealth: Payer: Self-pay

## 2012-12-11 MED ORDER — CEPHALEXIN 500 MG PO CAPS: 500.0000 mg | ORAL_CAPSULE | Freq: Four times a day (QID) | ORAL | Status: AC

## 2012-12-11 NOTE — Telephone Encounter (Signed)
Called pt and informed pt that she has a UTI and that an antibiotic Keflex has been sent to her [facility] pharmacy off [address].  Pt stated understanding and did not have any other questions.

## 2012-12-11 NOTE — Telephone Encounter (Signed)
Message copied by Faythe Casa on Tue Dec 11, 2012  8:48 AM ------      Message from: [facility]      Created: Fri Dec 07, 2012 10:11 PM      Regarding: UTI needs Tx       Got a culture back with E coli            I put in an order for Keflex            Can you notify her please?            Marie ------

## 2012-12-19 ENCOUNTER — Ambulatory Visit (HOSPITAL_COMMUNITY)
Admission: RE | Admit: 2012-12-19 | Discharge: 2012-12-19 | Disposition: A | Discharge status: Home / Self Care | Payer: Medicaid Other | Source: Ambulatory Visit | Attending: Advanced Practice Midwife | Admitting: Advanced Practice Midwife

## 2012-12-19 ENCOUNTER — Other Ambulatory Visit: Payer: Self-pay

## 2012-12-19 ENCOUNTER — Encounter (HOSPITAL_COMMUNITY): Payer: Self-pay

## 2012-12-19 ENCOUNTER — Other Ambulatory Visit: Payer: Self-pay | Admitting: Advanced Practice Midwife

## 2012-12-19 DIAGNOSIS — Z3689 Encounter for other specified antenatal screening: Secondary | ICD-10-CM | POA: Insufficient documentation

## 2012-12-19 DIAGNOSIS — Z3682 Encounter for antenatal screening for nuchal translucency: Secondary | ICD-10-CM

## 2012-12-19 DIAGNOSIS — O3510X0 Maternal care for (suspected) chromosomal abnormality in fetus, unspecified, not applicable or unspecified: Secondary | ICD-10-CM | POA: Insufficient documentation

## 2012-12-19 DIAGNOSIS — O351XX0 Maternal care for (suspected) chromosomal abnormality in fetus, not applicable or unspecified: Secondary | ICD-10-CM | POA: Insufficient documentation

## 2012-12-19 DIAGNOSIS — Z3687 Encounter for antenatal screening for uncertain dates: Secondary | ICD-10-CM

## 2012-12-19 DIAGNOSIS — O99330 Smoking (tobacco) complicating pregnancy, unspecified trimester: Secondary | ICD-10-CM | POA: Insufficient documentation

## 2012-12-19 NOTE — Progress Notes (Signed)
[facility] ultrasound  Indication: 27 yr old Z6X0960 at [redacted]w[redacted]d by unsure LMP for first trimester screen.  Findings: 1. Single intrauterine pregnancy. 2. Fetal crown rump length is consistent with [redacted]w[redacted]d. 3. Normal uterus; no adnexal masses seen. 4. Evaluation of fetal anatomy is limited by early gestational age. 5. Normal nuchal translucency measuring 1.43mm. 6. The nasal bone is visualized.  Recommendations: 1. Based on today's ultrasound recommend using an estimated due date of 06/29/13.  2. First trimester screen done today. Discussed limitations in detecting fetal aneuploidy. 3. Recommend maternal serum AFP at 15-[redacted] weeks gestation. 4. Recommend fetal anatomic survey at 18-[redacted] weeks gestation.  Eulis Foster, MD

## 2013-01-01 ENCOUNTER — Inpatient Hospital Stay (HOSPITAL_COMMUNITY)
Admission: AD | Admit: 2013-01-01 | Discharge: 2013-01-02 | Discharge status: Against Medical Advice | Payer: Medicaid Other | Source: Ambulatory Visit | Attending: Family Medicine | Admitting: Family Medicine

## 2013-01-01 DIAGNOSIS — O99891 Other specified diseases and conditions complicating pregnancy: Secondary | ICD-10-CM | POA: Insufficient documentation

## 2013-01-01 DIAGNOSIS — R109 Unspecified abdominal pain: Secondary | ICD-10-CM | POA: Insufficient documentation

## 2013-01-01 DIAGNOSIS — K5900 Constipation, unspecified: Secondary | ICD-10-CM | POA: Insufficient documentation

## 2013-01-01 DIAGNOSIS — Z3490 Encounter for supervision of normal pregnancy, unspecified, unspecified trimester: Secondary | ICD-10-CM

## 2013-01-01 LAB — URINALYSIS, ROUTINE W REFLEX MICROSCOPIC
Bilirubin Urine: NEGATIVE
Glucose, UA: NEGATIVE mg/dL
Hgb urine dipstick: NEGATIVE
Ketones, ur: NEGATIVE mg/dL
Leukocytes, UA: NEGATIVE
Nitrite: NEGATIVE
Protein, ur: NEGATIVE mg/dL
Specific Gravity, Urine: 1.02 (ref 1.005–1.030)
Urobilinogen, UA: 0.2 mg/dL (ref 0.0–1.0)
pH: 6.5 (ref 5.0–8.0)

## 2013-01-01 NOTE — MAU Note (Signed)
Reports no bowel movement "for a month", states she has tried multiple OTC's with no relief. Called clinic and they told her to come here for an enema. States she feels bloated and is having abd pain.

## 2013-01-02 ENCOUNTER — Ambulatory Visit (INDEPENDENT_AMBULATORY_CARE_PROVIDER_SITE_OTHER): Payer: Medicaid Other | Admitting: Advanced Practice Midwife

## 2013-01-02 ENCOUNTER — Inpatient Hospital Stay (HOSPITAL_COMMUNITY)
Admission: AD | Admit: 2013-01-02 | Discharge: 2013-01-02 | Disposition: A | Discharge status: Home / Self Care | Payer: Medicaid Other | Source: Ambulatory Visit | Attending: Obstetrics and Gynecology | Admitting: Obstetrics and Gynecology

## 2013-01-02 ENCOUNTER — Encounter (HOSPITAL_COMMUNITY): Payer: Self-pay | Admitting: *Deleted

## 2013-01-02 VITALS — BP 112/69 | Temp 97.9°F | Wt 168.5 lb

## 2013-01-02 DIAGNOSIS — L0501 Pilonidal cyst with abscess: Secondary | ICD-10-CM

## 2013-01-02 DIAGNOSIS — K5900 Constipation, unspecified: Secondary | ICD-10-CM

## 2013-01-02 DIAGNOSIS — O99612 Diseases of the digestive system complicating pregnancy, second trimester: Secondary | ICD-10-CM

## 2013-01-02 DIAGNOSIS — Z3490 Encounter for supervision of normal pregnancy, unspecified, unspecified trimester: Secondary | ICD-10-CM

## 2013-01-02 DIAGNOSIS — G4700 Insomnia, unspecified: Secondary | ICD-10-CM | POA: Insufficient documentation

## 2013-01-02 HISTORY — DX: Unspecified asthma, uncomplicated: J45.909

## 2013-01-02 LAB — POCT URINALYSIS DIP (DEVICE)
Bilirubin Urine: NEGATIVE
Glucose, UA: NEGATIVE mg/dL
Hgb urine dipstick: NEGATIVE
Ketones, ur: NEGATIVE mg/dL
Leukocytes, UA: NEGATIVE
Nitrite: NEGATIVE
Protein, ur: 30 mg/dL — AB
Specific Gravity, Urine: 1.02 (ref 1.005–1.030)
Urobilinogen, UA: 1 mg/dL (ref 0.0–1.0)
pH: 8.5 — ABNORMAL HIGH (ref 5.0–8.0)

## 2013-01-02 MED ORDER — ZOLPIDEM TARTRATE 10 MG PO TABS: 10.0000 mg | ORAL_TABLET | Freq: Every evening | ORAL | Status: DC | PRN

## 2013-01-02 MED ORDER — FLEET ENEMA 7-19 GM/118ML RE ENEM
1.0000 | ENEMA | Freq: Once | RECTAL | Status: AC
  Administered 2013-01-02: 1 via RECTAL

## 2013-01-02 MED ORDER — POLYETHYLENE GLYCOL 3350 17 G PO PACK: 17.0000 g | PACK | Freq: Two times a day (BID) | ORAL | Status: DC

## 2013-01-02 MED ORDER — METOCLOPRAMIDE HCL 10 MG PO TABS: 10.0000 mg | ORAL_TABLET | Freq: Four times a day (QID) | ORAL | Status: DC

## 2013-01-02 NOTE — MAU Provider Note (Signed)
History     CSN: [id number]  Arrival date and time: 01/02/13 4098   First Provider Initiated Contact with Patient 01/02/13 1840      Chief Complaint  Patient presents with  . Constipation  . Abdominal Cramping   HPI 27 y.o. [id number] at [redacted]w[redacted]d with constipation x 5 weeks, no BM, not passing gas, eating and drinking normally, nausea, no vomiting. Has tried Miralax TID x 1 week with prune juice, colace BID, increased fiber, no improvement.   Past Medical History  Diagnosis Date  . UTI (urinary tract infection)   . Medical history non-contributory   . Asthma     Past Surgical History  Procedure Laterality Date  . Cystectomy    . Wisdom tooth extraction    . Pilonidal cyst excision      Family History  Problem Relation Age of Onset  . Alcohol abuse Neg Hx   . Arthritis Neg Hx   . Asthma Neg Hx   . Birth defects Neg Hx   . Cancer Neg Hx   . COPD Neg Hx   . Depression Neg Hx   . Diabetes Neg Hx   . Drug abuse Neg Hx   . Early death Neg Hx   . Hearing loss Neg Hx   . Heart disease Neg Hx   . Hyperlipidemia Neg Hx   . Hypertension Neg Hx   . Kidney disease Neg Hx   . Learning disabilities Neg Hx   . Mental illness Neg Hx   . Mental retardation Neg Hx   . Miscarriages / Stillbirths Neg Hx   . Stroke Neg Hx   . Vision loss Neg Hx     History  Substance Use Topics  . Smoking status: Former Smoker -- 0.25 packs/day    Types: Cigarettes  . Smokeless tobacco: Never Used     Comment: 4 CIGS A DAY- Stopped smoking when she had positive upt  . Alcohol Use: No    Allergies: No Known Allergies  No prescriptions prior to admission    Review of Systems  Constitutional: Negative.   Respiratory: Negative.   Cardiovascular: Negative.   Gastrointestinal: Positive for nausea, abdominal pain and constipation. Negative for vomiting and diarrhea.  Genitourinary: Negative for dysuria, urgency, frequency, hematuria and flank pain.       Negative for vaginal bleeding,  cramping/contractions  Musculoskeletal: Negative.   Neurological: Negative.   Psychiatric/Behavioral: Negative.    Physical Exam   Blood pressure 109/62, pulse 83, temperature 98 F (36.7 C), temperature source Oral, resp. rate 18, last menstrual period 09/15/2012.  Physical Exam  Nursing note and vitals reviewed. Constitutional: She is oriented to person, place, and time. She appears well-developed and well-nourished. No distress.  Cardiovascular: Normal rate.   Respiratory: Effort normal.  GI: Soft. Bowel sounds are normal. She exhibits no distension and no mass. There is no tenderness. There is no rebound and no guarding.  Musculoskeletal: Normal range of motion.  Neurological: She is alert and oriented to person, place, and time.  Skin: Skin is warm and dry.  Psychiatric: She has a normal mood and affect.    MAU Course  Procedures Results for orders placed in visit on 01/02/13 (from the past 24 hour(s))  POCT URINALYSIS DIP (DEVICE)     Status: Abnormal   Collection Time    01/02/13  9:06 AM      Result Value Range   Glucose, UA NEGATIVE  NEGATIVE mg/dL   Bilirubin Urine NEGATIVE  NEGATIVE  Ketones, ur NEGATIVE  NEGATIVE mg/dL   Specific Gravity, Urine 1.020  1.005 - 1.030   Hgb urine dipstick NEGATIVE  NEGATIVE   pH 8.5 (*) 5.0 - 8.0   Protein, ur 30 (*) NEGATIVE mg/dL   Urobilinogen, UA 1.0  0.0 - 1.0 mg/dL   Nitrite NEGATIVE  NEGATIVE   Leukocytes, UA NEGATIVE  NEGATIVE   Fleet enema in MAU with good results immediately  Assessment and Plan   1. Normal intrauterine pregnancy on prenatal ultrasound   2. Constipation in pregnancy in second trimester   Stop Zofran d/t constipating effects, no need for nausea meds if not vomiting Take Miralax daily Increase fiber and water F/U as scheduled    Medication List    STOP taking these medications       ondansetron 4 MG tablet  Commonly known as:  ZOFRAN      TAKE these medications       acetaminophen 325 MG  tablet  Commonly known as:  TYLENOL  Take 650 mg by mouth every 6 (six) hours as needed for pain (For abdominal pain.).     albuterol 108 (90 BASE) MCG/ACT inhaler  Commonly known as:  PROVENTIL HFA;VENTOLIN HFA  Inhale 2 puffs into the lungs every 6 (six) hours as needed for wheezing.     doxylamine (Sleep) 25 MG tablet  Commonly known as:  UNISOM  Take 1 tablet (25 mg total) by mouth at bedtime as needed for sleep.     ibuprofen 200 MG tablet  Commonly known as:  ADVIL,MOTRIN  Take 400 mg by mouth every 8 (eight) hours as needed for pain (For abdominal pain.).     metoCLOPramide 10 MG tablet  Commonly known as:  REGLAN  Take 1 tablet (10 mg total) by mouth 4 (four) times daily.     polyethylene glycol packet  Commonly known as:  MIRALAX / GLYCOLAX  Take 17 g by mouth 2 (two) times daily.     Prenatal Vitamins 28-0.8 MG Tabs  Take 1 tablet by mouth daily.     pyridOXINE 50 MG tablet  Commonly known as:  B-6  Take 50 mg by mouth at bedtime.     zolpidem 10 MG tablet  Commonly known as:  AMBIEN  Take 1 tablet (10 mg total) by mouth at bedtime as needed for sleep.            Follow-up Information   Follow up with [facility]. (as scheduled)    Contact information:   [address] [place] [address] [phone]        [facility] 01/02/2013, 7:56 PM

## 2013-01-02 NOTE — MAU Note (Signed)
Not in lobby

## 2013-01-02 NOTE — Progress Notes (Signed)
Pulse: 87 Pt reports that she has not had a bowel movement in 5 weeks.  Would like rx for sleep unisom doesn't help.

## 2013-01-02 NOTE — MAU Note (Signed)
Patient is in with c/o no bowel movement in 5 weeks, no effective stool softner and cramping. She denies vaginal bleeding or discharge. She states that feels fetal movement.

## 2013-01-02 NOTE — MAU Provider Note (Signed)
Attestation of Attending Supervision of Advanced Practitioner: Evaluation and management procedures were performed by the PA/NP/CNM/OB Fellow under my supervision/collaboration. Chart reviewed and agree with management and plan.  FERGUSON,JOHN V 01/02/2013 9:03 PM

## 2013-01-02 NOTE — Patient Instructions (Signed)
Constipation, Adult Constipation is when a person has fewer than 3 bowel movements a week; has difficulty having a bowel movement; or has stools that are dry, hard, or larger than normal. As people grow older, constipation is more common. If you try to fix constipation with medicines that make you have a bowel movement (laxatives), the problem may get worse. Long-term laxative use may cause the muscles of the colon to become weak. A low-fiber diet, not taking in enough fluids, and taking certain medicines may make constipation worse. CAUSES   Certain medicines, such as antidepressants, pain medicine, iron supplements, antacids, and water pills.   Certain diseases, such as diabetes, irritable bowel syndrome (IBS), thyroid disease, or depression.   Not drinking enough water.   Not eating enough fiber-rich foods.   Stress or travel.  Lack of physical activity or exercise.  Not going to the restroom when there is the urge to have a bowel movement.  Ignoring the urge to have a bowel movement.  Using laxatives too much. SYMPTOMS   Having fewer than 3 bowel movements a week.   Straining to have a bowel movement.   Having hard, dry, or larger than normal stools.   Feeling full or bloated.   Pain in the lower abdomen.  Not feeling relief after having a bowel movement. DIAGNOSIS  Your caregiver will take a medical history and perform a physical exam. Further testing may be done for severe constipation. Some tests may include:   A barium enema X-ray to examine your rectum, colon, and sometimes, your small intestine.  A sigmoidoscopy to examine your lower colon.  A colonoscopy to examine your entire colon. TREATMENT  Treatment will depend on the severity of your constipation and what is causing it. Some dietary treatments include drinking more fluids and eating more fiber-rich foods. Lifestyle treatments may include regular exercise. If these diet and lifestyle recommendations  do not help, your caregiver may recommend taking over-the-counter laxative medicines to help you have bowel movements. Prescription medicines may be prescribed if over-the-counter medicines do not work.  HOME CARE INSTRUCTIONS   Increase dietary fiber in your diet, such as fruits, vegetables, whole grains, and beans. Limit high-fat and processed sugars in your diet, such as [place] fries, hamburgers, cookies, candies, and soda.   A fiber supplement may be added to your diet if you cannot get enough fiber from foods.   Drink enough fluids to keep your urine clear or pale yellow.   Exercise regularly or as directed by your caregiver.   Go to the restroom when you have the urge to go. Do not hold it.  Only take medicines as directed by your caregiver. Do not take other medicines for constipation without talking to your caregiver first. SEEK IMMEDIATE MEDICAL CARE IF:   You have bright red blood in your stool.   Your constipation lasts for more than 4 days or gets worse.   You have abdominal or rectal pain.   You have thin, pencil-like stools.  You have unexplained weight loss. MAKE SURE YOU:   Understand these instructions.  Will watch your condition.  Will get help right away if you are not doing well or get worse. Document Released: 04/01/2004 Document Revised: 09/26/2011 Document Reviewed: 06/07/2011 [facility] Patient Information 2014 [place], [place]. Insomnia Insomnia is frequent trouble falling and/or staying asleep. Insomnia can be a long term problem or a short term problem. Both are common. Insomnia can be a short term problem when the wakefulness is related  to a certain stress or worry. Long term insomnia is often related to ongoing stress during waking hours and/or poor sleeping habits. Overtime, sleep deprivation itself can make the problem worse. Every little thing feels more severe because you are overtired and your ability to cope is decreased. CAUSES   Stress,  anxiety, and depression.  Poor sleeping habits.  Distractions such as TV in the bedroom.  Naps close to bedtime.  Engaging in emotionally charged conversations before bed.  Technical reading before sleep.  Alcohol and other sedatives. They may make the problem worse. They can hurt normal sleep patterns and normal dream activity.  Stimulants such as caffeine for several hours prior to bedtime.  Pain syndromes and shortness of breath can cause insomnia.  Exercise late at night.  Changing time zones may cause sleeping problems (jet lag). It is sometimes helpful to have someone observe your sleeping patterns. They should look for periods of not breathing during the night (sleep apnea). They should also look to see how long those periods last. If you live alone or observers are uncertain, you can also be observed at a sleep clinic where your sleep patterns will be professionally monitored. Sleep apnea requires a checkup and treatment. Give your caregivers your medical history. Give your caregivers observations your family has made about your sleep.  SYMPTOMS   Not feeling rested in the morning.  Anxiety and restlessness at bedtime.  Difficulty falling and staying asleep. TREATMENT   Your caregiver may prescribe treatment for an underlying medical disorders. Your caregiver can give advice or help if you are using alcohol or other drugs for self-medication. Treatment of underlying problems will usually eliminate insomnia problems.  Medications can be prescribed for short time use. They are generally not recommended for lengthy use.  Over-the-counter sleep medicines are not recommended for lengthy use. They can be habit forming.  You can promote easier sleeping by making lifestyle changes such as:  Using relaxation techniques that help with breathing and reduce muscle tension.  Exercising earlier in the day.  Changing your diet and the time of your last meal. No night time  snacks.  Establish a regular time to go to bed.  Counseling can help with stressful problems and worry.  Soothing music and white noise may be helpful if there are background noises you cannot remove.  Stop tedious detailed work at least one hour before bedtime. HOME CARE INSTRUCTIONS   Keep a diary. Inform your caregiver about your progress. This includes any medication side effects. See your caregiver regularly. Take note of:  Times when you are asleep.  Times when you are awake during the night.  The quality of your sleep.  How you feel the next day. This information will help your caregiver care for you.  Get out of bed if you are still awake after 15 minutes. Read or do some quiet activity. Keep the lights down. Wait until you feel sleepy and go back to bed.  Keep regular sleeping and waking hours. Avoid naps.  Exercise regularly.  Avoid distractions at bedtime. Distractions include watching television or engaging in any intense or detailed activity like attempting to balance the household checkbook.  Develop a bedtime ritual. Keep a familiar routine of bathing, brushing your teeth, climbing into bed at the same time each night, listening to soothing music. Routines increase the success of falling to sleep faster.  Use relaxation techniques. This can be using breathing and muscle tension release routines. It can also include visualizing  peaceful scenes. You can also help control troubling or intruding thoughts by keeping your mind occupied with boring or repetitive thoughts like the old concept of counting sheep. You can make it more creative like imagining planting one beautiful flower after another in your backyard garden.  During your day, work to eliminate stress. When this is not possible use some of the previous suggestions to help reduce the anxiety that accompanies stressful situations. MAKE SURE YOU:   Understand these instructions.  Will watch your  condition.  Will get help right away if you are not doing well or get worse. Document Released: 07/01/2000 Document Revised: 09/26/2011 Document Reviewed: 08/01/2007 [facility] Patient Information 2014 [place], [place].

## 2013-01-02 NOTE — Progress Notes (Signed)
Has had constipation. 5 wks with no BM. Went to MAU yesterday but left after 4 hrs to go to school. 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 Takes Zofran nightly for nausea. Warned this will cause constipation. Needs ambien for sleep. Had previously been on this for sleep disturbance prior to pregnancy. Will provide Rx.

## 2013-01-02 NOTE — MAU Note (Signed)
Pt was given a Fleets enema and had a large BM and states she feels much better now.

## 2013-01-14 ENCOUNTER — Encounter: Payer: Self-pay | Admitting: *Deleted

## 2013-01-14 DIAGNOSIS — Z3480 Encounter for supervision of other normal pregnancy, unspecified trimester: Secondary | ICD-10-CM | POA: Insufficient documentation

## 2013-01-30 ENCOUNTER — Encounter: Payer: Self-pay | Admitting: Obstetrics and Gynecology

## 2013-01-30 ENCOUNTER — Ambulatory Visit (INDEPENDENT_AMBULATORY_CARE_PROVIDER_SITE_OTHER): Payer: Medicaid Other | Admitting: Obstetrics and Gynecology

## 2013-01-30 VITALS — BP 121/69 | Temp 97.2°F | Wt 170.7 lb

## 2013-01-30 DIAGNOSIS — O99891 Other specified diseases and conditions complicating pregnancy: Secondary | ICD-10-CM

## 2013-01-30 DIAGNOSIS — Z3490 Encounter for supervision of normal pregnancy, unspecified, unspecified trimester: Secondary | ICD-10-CM

## 2013-01-30 DIAGNOSIS — G4700 Insomnia, unspecified: Secondary | ICD-10-CM

## 2013-01-30 LAB — POCT URINALYSIS DIP (DEVICE)
Bilirubin Urine: NEGATIVE
Glucose, UA: NEGATIVE mg/dL
Hgb urine dipstick: NEGATIVE
Ketones, ur: NEGATIVE mg/dL
Leukocytes, UA: NEGATIVE
Nitrite: NEGATIVE
Protein, ur: NEGATIVE mg/dL
Specific Gravity, Urine: 1.02 (ref 1.005–1.030)
Urobilinogen, UA: 0.2 mg/dL (ref 0.0–1.0)
pH: 7 (ref 5.0–8.0)

## 2013-01-30 MED ORDER — ZOLPIDEM TARTRATE 10 MG PO TABS: 10.0000 mg | ORAL_TABLET | Freq: Every evening | ORAL | Status: DC | PRN

## 2013-01-30 NOTE — Patient Instructions (Signed)

## 2013-01-30 NOTE — Progress Notes (Signed)
Pulse- 89  Patient reports round ligament pain

## 2013-01-30 NOTE — Progress Notes (Signed)
[place] scheduled. Quad results normal reviewed. Insomnia discussed. Ambien effective. N/V resolving. 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 Fundus u-2.

## 2013-01-31 ENCOUNTER — Encounter: Payer: Self-pay | Admitting: *Deleted

## 2013-01-31 DIAGNOSIS — O9989 Other specified diseases and conditions complicating pregnancy, childbirth and the puerperium: Secondary | ICD-10-CM

## 2013-01-31 DIAGNOSIS — O99891 Other specified diseases and conditions complicating pregnancy: Secondary | ICD-10-CM | POA: Insufficient documentation

## 2013-02-06 ENCOUNTER — Ambulatory Visit (HOSPITAL_COMMUNITY)
Admission: RE | Admit: 2013-02-06 | Discharge: 2013-02-06 | Disposition: A | Discharge status: Home / Self Care | Payer: Medicaid Other | Source: Ambulatory Visit | Attending: Obstetrics and Gynecology | Admitting: Obstetrics and Gynecology

## 2013-02-06 DIAGNOSIS — Z1389 Encounter for screening for other disorder: Secondary | ICD-10-CM | POA: Insufficient documentation

## 2013-02-06 DIAGNOSIS — Z363 Encounter for antenatal screening for malformations: Secondary | ICD-10-CM | POA: Insufficient documentation

## 2013-02-06 DIAGNOSIS — Z3490 Encounter for supervision of normal pregnancy, unspecified, unspecified trimester: Secondary | ICD-10-CM

## 2013-02-19 ENCOUNTER — Encounter: Payer: Self-pay | Admitting: *Deleted

## 2013-02-27 ENCOUNTER — Encounter: Payer: Medicaid Other | Admitting: Family Medicine

## 2013-03-06 ENCOUNTER — Encounter: Payer: Medicaid Other | Admitting: Obstetrics and Gynecology

## 2013-03-10 ENCOUNTER — Emergency Department (HOSPITAL_COMMUNITY)
Admission: EM | Admit: 2013-03-10 | Discharge: 2013-03-10 | Disposition: A | Discharge status: Home / Self Care | Payer: Medicaid Other | Attending: Emergency Medicine | Admitting: Emergency Medicine

## 2013-03-10 ENCOUNTER — Encounter (HOSPITAL_COMMUNITY): Payer: Self-pay | Admitting: Vascular Surgery

## 2013-03-10 DIAGNOSIS — Z87440 Personal history of urinary (tract) infections: Secondary | ICD-10-CM | POA: Insufficient documentation

## 2013-03-10 DIAGNOSIS — M25561 Pain in right knee: Secondary | ICD-10-CM

## 2013-03-10 DIAGNOSIS — M7989 Other specified soft tissue disorders: Secondary | ICD-10-CM | POA: Insufficient documentation

## 2013-03-10 DIAGNOSIS — J45909 Unspecified asthma, uncomplicated: Secondary | ICD-10-CM | POA: Insufficient documentation

## 2013-03-10 DIAGNOSIS — Z87891 Personal history of nicotine dependence: Secondary | ICD-10-CM | POA: Insufficient documentation

## 2013-03-10 DIAGNOSIS — M79609 Pain in unspecified limb: Secondary | ICD-10-CM

## 2013-03-10 DIAGNOSIS — M25569 Pain in unspecified knee: Secondary | ICD-10-CM | POA: Insufficient documentation

## 2013-03-10 DIAGNOSIS — Z79899 Other long term (current) drug therapy: Secondary | ICD-10-CM | POA: Insufficient documentation

## 2013-03-10 DIAGNOSIS — O9989 Other specified diseases and conditions complicating pregnancy, childbirth and the puerperium: Secondary | ICD-10-CM | POA: Insufficient documentation

## 2013-03-10 NOTE — ED Provider Notes (Signed)
CSN: [id number]     Arrival date & time 03/10/13  1733 History  This chart was scribed for non-physician practitioner Antony Madura, PA-C working with Shanna Cisco, MD by Danella Maiers, ED Scribe. This patient was seen in room TR05C/TR05C and the patient's care was started at 6:11 PM.    Chief Complaint  Patient presents with  . Knee Pain    Patient is a 27 y.o. female presenting with knee pain. The history is provided by the patient. No language interpreter was used.  Knee Pain Location:  Knee Time since incident:  1 month Injury: no   Knee location:  R knee Pain details:    Quality:  Aching and throbbing   Radiates to:  Does not radiate   Onset quality:  Gradual  HPI Comments: Stacy [name] is a 27 y.o. female who is [redacted] weeks pregnant who presents to the Emergency Department complaining of worsening, non-radiating, aching and throbbing, intermittent right knee pain that has been ongoing for the past month and a half with associated swelling. She denies any recent falls or injuries. She has no pain sitting down but experiences pain upon standing and extending the right lower extremity. She has no trouble ambulating. She denies knee redness, pain in her calf, and swelling in her ankles.  She has tried icy hot, epsom salt, heat packs, cold packs, tylenol ibuprofen with minimal relief. She denies h/o DVTs/PEs or arthritis. She denies any recent hospitalizations or surgeries in the last month.    Past Medical History  Diagnosis Date  . UTI (urinary tract infection)   . Medical history non-contributory   . Asthma    Past Surgical History  Procedure Laterality Date  . Cystectomy    . Wisdom tooth extraction    . Pilonidal cyst excision     Family History  Problem Relation Age of Onset  . Alcohol abuse Neg Hx   . Arthritis Neg Hx   . Asthma Neg Hx   . Birth defects Neg Hx   . Cancer Neg Hx   . COPD Neg Hx   . Depression Neg Hx   . Diabetes Neg Hx   . Drug abuse Neg Hx    . Early death Neg Hx   . Hearing loss Neg Hx   . Heart disease Neg Hx   . Hyperlipidemia Neg Hx   . Hypertension Neg Hx   . Kidney disease Neg Hx   . Learning disabilities Neg Hx   . Mental illness Neg Hx   . Mental retardation Neg Hx   . Miscarriages / Stillbirths Neg Hx   . Stroke Neg Hx   . Vision loss Neg Hx    History  Substance Use Topics  . Smoking status: Former Smoker -- 0.25 packs/day    Types: Cigarettes  . Smokeless tobacco: Never Used     Comment: 4 CIGS A DAY- Stopped smoking when she had positive upt  . Alcohol Use: No   OB History   Grav Para Term Preterm Abortions TAB SAB Ect Mult Living   4 2 2  1 1    2      Review of Systems  Cardiovascular: Negative for leg swelling.  Musculoskeletal: Positive for arthralgias (right knee pain).  Neurological: Negative for weakness and numbness.  All other systems reviewed and are negative.    Allergies  Review of patient's allergies indicates no known allergies.  Home Medications   Current Outpatient Rx  Name  Route  Sig  Dispense  Refill  . albuterol (PROVENTIL HFA;VENTOLIN HFA) 108 (90 BASE) MCG/ACT inhaler   Inhalation   Inhale 2 puffs into the lungs every 6 (six) hours as needed for wheezing.   1 Inhaler   2   . Prenatal Vit-Fe Fumarate-FA (PRENATAL VITAMINS) 28-0.8 MG TABS   Oral   Take 1 tablet by mouth daily.   30 tablet   12   . pyridOXINE (B-6) 50 MG tablet   Oral   Take 50 mg by mouth at bedtime.         Marland Kitchen EXPIRED: zolpidem (AMBIEN) 10 MG tablet   Oral   Take 1 tablet (10 mg total) by mouth at bedtime as needed for sleep.   30 tablet   1    BP 123/59  Pulse 88  Temp(Src) 98.5 F (36.9 C) (Oral)  Resp 18  SpO2 100%  LMP 09/15/2012  Breastfeeding? Unknown Physical Exam  Nursing note and vitals reviewed. Constitutional: She is oriented to person, place, and time. She appears well-developed and well-nourished. No distress.  HENT:  Head: Normocephalic and atraumatic.  Eyes:  Conjunctivae and EOM are normal. No scleral icterus.  Neck: Normal range of motion.  Cardiovascular: Normal rate, regular rhythm and intact distal pulses.   Pulses:      Dorsalis pedis pulses are 2+ on the right side, and 2+ on the left side.       Posterior tibial pulses are 2+ on the right side, and 2+ on the left side.  Pulmonary/Chest: Effort normal. No respiratory distress.  Musculoskeletal: Normal range of motion.  Mild swelling to anterior and medial knee without effusion.  No pain with valgus and varus stressing of the right knee. No laxity. No bony tenderness  Neurological: She is alert and oriented to person, place, and time. She has normal reflexes. She displays normal reflexes.  5/5 strength against resistance in the RLE. DTRs are symmetric and normal.  Skin: Skin is warm and dry. No rash noted. She is not diaphoretic. No erythema. No pallor.  Capillary refill is normal. No redness. No heat to touch.  Psychiatric: She has a normal mood and affect. Her behavior is normal.    ED Course  Medications - No data to display  DIAGNOSTIC STUDIES: Oxygen Saturation is 100% on room air, normal by my interpretation.    COORDINATION OF CARE: 6:26 PM- Discussed treatment plan with pt including recommending a follow up with an orthopedist and discussing the option of an ultrasound and pt agrees to plan.  Procedures (including critical care time)  VASCULAR LAB  PRELIMINARY PRELIMINARY PRELIMINARY PRELIMINARY  Right lower extremity venous Doppler completed.  Preliminary report: There is no DVT or SVT noted in the right lower extremity.  KANADY, CANDACE, RVT  03/10/2013, 7:17 PM  Labs Reviewed - No data to display No results found.  1. Knee pain, acute, right    MDM  27 year old female, currently [redacted] weeks pregnant, who presents for pain to her right knee and right lower extremity x1.5 months. Patient neurovascularly intact, well and nontoxic appearing, and hemodynamically stable. She  is afebrile. Patient denies any fall or trauma to her knee inciting her pain. Strength and sensation of her right lower extremity is intact. Ultrasound of right lower extremity negative for DVT or SVT. Do not believe further workup with x-ray is warranted given full range of motion, lack of bony tenderness, left trauma, inability to bear weight. Knee sleep applied in ED for stability. Patient appropriate  for discharge with RICE instruction and orthopedic followup for further evaluation of symptoms. Indications for ED return discussed and patient agreeable to plan with no unaddressed concerns.   I personally performed the services described in this documentation, which was scribed in my presence. The recorded information has been reviewed and is accurate.     Antony Madura, PA-C 03/10/13 1939

## 2013-03-10 NOTE — Progress Notes (Signed)
VASCULAR LAB PRELIMINARY  PRELIMINARY  PRELIMINARY  PRELIMINARY  Right lower extremity venous Doppler completed.    Preliminary report:  There is no DVT or SVT noted in the right lower extremity.  KANADY, CANDACE, RVT 03/10/2013, 7:17 PM

## 2013-03-10 NOTE — ED Notes (Signed)
Pt reports to the ED for eval of right knee pain. Pt reports she shut her right leg in her car door 8 months ago and has had leg pain ever since. However, pt now reports she has developed increasing knee pain x 1 month. Pt also reports knot to right shin area and when the area is touched the pain radiates into her lateral leg. CMS and full ROM intact. No deformity or swelling noted. Denies any recent injuries.

## 2013-03-11 NOTE — ED Provider Notes (Signed)
Medical screening examination/treatment/procedure(s) were performed by non-physician practitioner and as supervising physician I was immediately available for consultation/collaboration.   Megan E Docherty, MD 03/11/13 0020 

## 2013-03-14 ENCOUNTER — Inpatient Hospital Stay (HOSPITAL_COMMUNITY): Payer: Medicaid Other

## 2013-03-14 ENCOUNTER — Inpatient Hospital Stay (HOSPITAL_COMMUNITY)
Admission: AD | Admit: 2013-03-14 | Discharge: 2013-03-14 | Disposition: A | Discharge status: Home / Self Care | Payer: Medicaid Other | Source: Ambulatory Visit | Attending: Obstetrics & Gynecology | Admitting: Obstetrics & Gynecology

## 2013-03-14 ENCOUNTER — Encounter (HOSPITAL_COMMUNITY): Payer: Self-pay

## 2013-03-14 DIAGNOSIS — O4700 False labor before 37 completed weeks of gestation, unspecified trimester: Secondary | ICD-10-CM | POA: Insufficient documentation

## 2013-03-14 DIAGNOSIS — R1030 Lower abdominal pain, unspecified: Secondary | ICD-10-CM

## 2013-03-14 DIAGNOSIS — O26872 Cervical shortening, second trimester: Secondary | ICD-10-CM

## 2013-03-14 DIAGNOSIS — M549 Dorsalgia, unspecified: Secondary | ICD-10-CM | POA: Insufficient documentation

## 2013-03-14 DIAGNOSIS — B379 Candidiasis, unspecified: Secondary | ICD-10-CM

## 2013-03-14 DIAGNOSIS — O99891 Other specified diseases and conditions complicating pregnancy: Secondary | ICD-10-CM | POA: Insufficient documentation

## 2013-03-14 DIAGNOSIS — O26879 Cervical shortening, unspecified trimester: Secondary | ICD-10-CM | POA: Diagnosis present

## 2013-03-14 DIAGNOSIS — R109 Unspecified abdominal pain: Secondary | ICD-10-CM | POA: Insufficient documentation

## 2013-03-14 LAB — URINALYSIS, ROUTINE W REFLEX MICROSCOPIC
Bilirubin Urine: NEGATIVE
Glucose, UA: NEGATIVE mg/dL
Hgb urine dipstick: NEGATIVE
Ketones, ur: NEGATIVE mg/dL
Leukocytes, UA: NEGATIVE
Nitrite: NEGATIVE
Protein, ur: NEGATIVE mg/dL
Specific Gravity, Urine: 1.025 (ref 1.005–1.030)
Urobilinogen, UA: 0.2 mg/dL (ref 0.0–1.0)
pH: 7.5 (ref 5.0–8.0)

## 2013-03-14 LAB — WET PREP, GENITAL
Clue Cells Wet Prep HPF POC: NONE SEEN
Trich, Wet Prep: NONE SEEN
Yeast Wet Prep HPF POC: NONE SEEN

## 2013-03-14 LAB — FETAL FIBRONECTIN: Fetal Fibronectin: NEGATIVE

## 2013-03-14 MED ORDER — BETAMETHASONE SOD PHOS & ACET 6 (3-3) MG/ML IJ SUSP: 12.5000 mg | Freq: Once | INTRAMUSCULAR | Status: DC

## 2013-03-14 MED ORDER — PROGESTERONE MICRONIZED 200 MG PO CAPS
200.0000 mg | ORAL_CAPSULE | Freq: Every day | ORAL | Status: DC
  Administered 2013-03-14: 200 mg via VAGINAL
  Filled 2013-03-14: qty 1

## 2013-03-14 MED ORDER — PROGESTERONE MICRONIZED 200 MG PO CAPS: 200.0000 mg | ORAL_CAPSULE | Freq: Every day | ORAL | Status: DC

## 2013-03-14 MED ORDER — NIFEDIPINE 10 MG PO CAPS
20.0000 mg | ORAL_CAPSULE | Freq: Once | ORAL | Status: AC
  Administered 2013-03-14: 20 mg via ORAL
  Filled 2013-03-14: qty 2

## 2013-03-14 MED ORDER — NIFEDIPINE ER OSMOTIC RELEASE 30 MG PO TB24: 30.0000 mg | ORAL_TABLET | Freq: Two times a day (BID) | ORAL | Status: DC

## 2013-03-14 MED ORDER — NIFEDIPINE 10 MG PO CAPS: 20.0000 mg | ORAL_CAPSULE | Freq: Four times a day (QID) | ORAL | Status: DC

## 2013-03-14 MED ORDER — FLUCONAZOLE 150 MG PO TABS
150.0000 mg | ORAL_TABLET | Freq: Once | ORAL | Status: AC
  Administered 2013-03-14: 150 mg via ORAL
  Filled 2013-03-14: qty 1

## 2013-03-14 MED ORDER — BETAMETHASONE SOD PHOS & ACET 6 (3-3) MG/ML IJ SUSP
12.0000 mg | Freq: Once | INTRAMUSCULAR | Status: AC
  Administered 2013-03-14: 12 mg via INTRAMUSCULAR
  Filled 2013-03-14: qty 2

## 2013-03-14 NOTE — MAU Note (Signed)
Patient states she has been having sharp abdominal and back pain for 2 days. Has some nausea with occasional vomiting. Denies bleeding or discharge.

## 2013-03-14 NOTE — MAU Provider Note (Signed)
Chief Complaint:  Abdominal Pain and Back Pain   Stacy [name] is a 27 y.o.  [id number] with IUP at [redacted]w[redacted]d by 1st trimester CRL presenting for Abdominal Pain and Back Pain  Pt states that starting yesterday she noticed sharp abdominal and back pains that would come and go. They would last 1 min at a time and occurred every 20 min. Today they have become closer together and are now every 10 min. She denies any VB or LOF. No recent intercourse or vaginal checks.  Denies fevers, chills, nausea, diarrhea.  She did vomit once yesterday but none today.    Pt is seen in [facility]. PNC thus far has been uncomplicated.      Menstrual History: OB History   Grav Para Term Preterm Abortions TAB SAB Ect Mult Living   4 2 2  1 1    2        Patient's last menstrual period was 09/15/2012.      Past Medical History  Diagnosis Date  . UTI (urinary tract infection)   . Medical history non-contributory   . Asthma     Past Surgical History  Procedure Laterality Date  . Cystectomy    . Wisdom tooth extraction    . Pilonidal cyst excision      Family History  Problem Relation Age of Onset  . Alcohol abuse Neg Hx   . Arthritis Neg Hx   . Asthma Neg Hx   . Birth defects Neg Hx   . Cancer Neg Hx   . COPD Neg Hx   . Depression Neg Hx   . Diabetes Neg Hx   . Drug abuse Neg Hx   . Early death Neg Hx   . Hearing loss Neg Hx   . Heart disease Neg Hx   . Hyperlipidemia Neg Hx   . Hypertension Neg Hx   . Kidney disease Neg Hx   . Learning disabilities Neg Hx   . Mental illness Neg Hx   . Mental retardation Neg Hx   . Miscarriages / Stillbirths Neg Hx   . Stroke Neg Hx   . Vision loss Neg Hx     History  Substance Use Topics  . Smoking status: Former Smoker -- 0.25 packs/day    Types: Cigarettes  . Smokeless tobacco: Never Used     Comment: 4 CIGS A DAY- Stopped smoking when she had positive upt  . Alcohol Use: No     No Known Allergies  Prescriptions prior to admission  Medication  Sig Dispense Refill  . Prenatal Vit-Fe Fumarate-FA (PRENATAL VITAMINS) 28-0.8 MG TABS Take 1 tablet by mouth daily.  30 tablet  12  . zolpidem (AMBIEN) 10 MG tablet Take 1 tablet (10 mg total) by mouth at bedtime as needed for sleep.  30 tablet  1  . albuterol (PROVENTIL HFA;VENTOLIN HFA) 108 (90 BASE) MCG/ACT inhaler Inhale 2 puffs into the lungs every 6 (six) hours as needed for wheezing.  1 Inhaler  2    Review of Systems - Negative except for what is mentioned in HPI.  Physical Exam  Blood pressure 100/59, pulse 85, temperature 98.5 F (36.9 C), temperature source Oral, resp. rate 16, height 5' 4.5" (1.638 m), weight 178 lb 12.8 oz (81.103 kg), last menstrual period 09/15/2012, SpO2 100.00%. GENERAL: Well-developed, well-nourished female in no acute distress.  LUNGS: Clear to auscultation bilaterally.  HEART: Regular rate and rhythm. ABDOMEN: Soft, nontender, nondistended, gravid.  EXTREMITIES: Nontender, no edema, 2+ distal pulses.  Cervical Exam: Dilatation 0cm   Effacement thick   Station high   FHT:  Baseline rate 150 bpm   Variability moderate  Accelerations present   Decelerations none Contractions: quiet, but with some irritability at times.    Labs: Results for orders placed during the hospital encounter of 03/14/13 (from the past 24 hour(s))  URINALYSIS, ROUTINE W REFLEX MICROSCOPIC   Collection Time    03/14/13  1:00 PM      Result Value Range   Color, Urine YELLOW  YELLOW   APPearance HAZY (*) CLEAR   Specific Gravity, Urine 1.025  1.005 - 1.030   pH 7.5  5.0 - 8.0   Glucose, UA NEGATIVE  NEGATIVE mg/dL   Hgb urine dipstick NEGATIVE  NEGATIVE   Bilirubin Urine NEGATIVE  NEGATIVE   Ketones, ur NEGATIVE  NEGATIVE mg/dL   Protein, ur NEGATIVE  NEGATIVE mg/dL   Urobilinogen, UA 0.2  0.0 - 1.0 mg/dL   Nitrite NEGATIVE  NEGATIVE   Leukocytes, UA NEGATIVE  NEGATIVE  WET PREP, GENITAL   Collection Time    03/14/13  1:40 PM      Result Value Range   Yeast Wet Prep HPF  POC NONE SEEN  NONE SEEN   Trich, Wet Prep NONE SEEN  NONE SEEN   Clue Cells Wet Prep HPF POC NONE SEEN  NONE SEEN   WBC, Wet Prep HPF POC MODERATE (*) NONE SEEN  FETAL FIBRONECTIN   Collection Time    03/14/13  1:40 PM      Result Value Range   Fetal Fibronectin NEGATIVE  NEGATIVE    Imaging Studies:  No results found.  Assessment: Stacy [name] is  27 y.o. [id number] at [redacted]w[redacted]d presents with Abdominal Pain and Back Pain .  Plan: - SVE 0/0/high - FFN collected but not sent yet - send wet prep and gc/chl -Ultrasound ordered to check cervical length  BECK, Redmond Baseman, MD  ANYANWU,UGONNA A 8/28/20146:12 PM  Attestation of Attending Supervision of Obstetric Fellow: Evaluation and management procedures were performed by the Obstetric Fellow under my supervision and collaboration.  I have reviewed the Obstetric Fellow's note and chart, and I agree with the management and plan.  1630 Preliminary read of the ultrasound showed cervical length at 1.2 cm with funneling.  Also notable is low-lying placenta about 1.4 cm from the internal os , final read pending. Patient still reported abdominal cramping, tocometer showed irregular contractions. She was encouraged to drink plenty of water to see if this helps her contractions. Previously collected FFN sent to the lab.  1811 Patient still reports q 3-4 minute contractions.  Ordered for Procardia 20 mg po x1 for tocolysis. FFN was negative; also negative UA and wet prep. Will evaluate response.  Ordered for betamethasone 12.5 mg IM x 1. The Procardia 20 mg po can be repeated in 90 minutes if needed. If contractions do not space out, patient will need observation and further tocolysis. She also needs to be started on Prometrium 200 mg pv qhs, this was ordered   Jaynie Collins, MD, FACOG Attending Obstetrician & Gynecologist Faculty Practice, [facility]   2000 Patient now quiet on [place].  FFN was negative.  Will d/c to home on  PV progesterone 200mg  and procardia 30xl BID. She will come to MAU tomorrow for her second betamethasone.  BECK, Redmond Baseman, MD

## 2013-03-14 NOTE — MAU Provider Note (Signed)
Attestation of Attending Supervision of Obstetric Fellow: Evaluation and management procedures were performed by the Obstetric Fellow under my supervision and collaboration.  I have reviewed the Obstetric Fellow's note and chart, and I agree with the management and plan.  UGONNA  ANYANWU, MD, FACOG Attending Obstetrician & Gynecologist Faculty Practice, [facility]   

## 2013-03-15 ENCOUNTER — Telehealth: Payer: Self-pay

## 2013-03-15 ENCOUNTER — Inpatient Hospital Stay (HOSPITAL_COMMUNITY)
Admission: AD | Admit: 2013-03-15 | Discharge: 2013-03-15 | Disposition: A | Discharge status: Home / Self Care | Payer: Medicaid Other | Source: Ambulatory Visit | Attending: Obstetrics & Gynecology | Admitting: Obstetrics & Gynecology

## 2013-03-15 DIAGNOSIS — O4700 False labor before 37 completed weeks of gestation, unspecified trimester: Secondary | ICD-10-CM | POA: Insufficient documentation

## 2013-03-15 LAB — GC/CHLAMYDIA PROBE AMP
CT Probe RNA: NEGATIVE
GC Probe RNA: NEGATIVE

## 2013-03-15 MED ORDER — BETAMETHASONE SOD PHOS & ACET 6 (3-3) MG/ML IJ SUSP
12.0000 mg | Freq: Once | INTRAMUSCULAR | Status: AC
  Administered 2013-03-15: 12 mg via INTRAMUSCULAR
  Filled 2013-03-15: qty 2

## 2013-03-15 NOTE — Telephone Encounter (Signed)
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 Called pt and informed pt that it is not recommended to get Hep B vaccine during pregnancy and to speak with her instructor concerning what she needs to do.  Pt stated understanding and had no other questions.

## 2013-03-17 ENCOUNTER — Inpatient Hospital Stay (HOSPITAL_COMMUNITY)
Admission: AD | Admit: 2013-03-17 | Discharge: 2013-03-18 | Disposition: A | Discharge status: Home / Self Care | Payer: Medicaid Other | Source: Ambulatory Visit | Attending: Obstetrics & Gynecology | Admitting: Obstetrics & Gynecology

## 2013-03-17 ENCOUNTER — Encounter (HOSPITAL_COMMUNITY): Payer: Self-pay | Admitting: *Deleted

## 2013-03-17 DIAGNOSIS — O4702 False labor before 37 completed weeks of gestation, second trimester: Secondary | ICD-10-CM

## 2013-03-17 DIAGNOSIS — O4700 False labor before 37 completed weeks of gestation, unspecified trimester: Secondary | ICD-10-CM | POA: Insufficient documentation

## 2013-03-17 LAB — URINALYSIS, ROUTINE W REFLEX MICROSCOPIC
Bilirubin Urine: NEGATIVE
Glucose, UA: NEGATIVE mg/dL
Hgb urine dipstick: NEGATIVE
Ketones, ur: NEGATIVE mg/dL
Leukocytes, UA: NEGATIVE
Nitrite: NEGATIVE
Protein, ur: NEGATIVE mg/dL
Specific Gravity, Urine: 1.02 (ref 1.005–1.030)
Urobilinogen, UA: 0.2 mg/dL (ref 0.0–1.0)
pH: 7.5 (ref 5.0–8.0)

## 2013-03-17 MED ORDER — PROMETHAZINE HCL 25 MG/ML IJ SOLN
25.0000 mg | Freq: Once | INTRAMUSCULAR | Status: AC
  Administered 2013-03-17: 25 mg via INTRAVENOUS
  Filled 2013-03-17: qty 1

## 2013-03-17 MED ORDER — MORPHINE SULFATE 4 MG/ML IJ SOLN
2.0000 mg | Freq: Once | INTRAMUSCULAR | Status: AC
  Administered 2013-03-18: 2 mg via INTRAVENOUS
  Filled 2013-03-17: qty 1

## 2013-03-17 MED ORDER — SODIUM CHLORIDE 0.9 % IV SOLN
Freq: Once | INTRAVENOUS | Status: AC
  Administered 2013-03-17: via INTRAVENOUS

## 2013-03-17 NOTE — MAU Note (Addendum)
Here 3 days ago for UC, UC continued, today UC 5-6/hr, no bloody show, no leading of fluid, good FM all the time On Rx from 3 days ago for UC 2 doses of Betamethasone, and fFN net 8/28

## 2013-03-17 NOTE — MAU Provider Note (Cosign Needed)
History     CSN: [id number]  Arrival date and time: 03/17/13 2046   First Provider Initiated Contact with Patient 03/17/13 2309      Chief Complaint  Patient presents with  . Laboring   HPI Stacy [name] is a 27 y.o. [id number] at [redacted]w[redacted]d presents for evaluation of preterm contractions. Pt recently in MAU for similar complaints and found to have a shortened cervix and funneling with neg FFN on 29Aug. Pt was started on prometrium and procardia and has been having contrcations approx q10-15 min. Not as frequent as 29Aug. Pt has no vb no lof and good fetal movement. No recent intercourse.  Of note pt states that she had a very active day today and may be dehydrated from being outside. Pt states ctx worse since being outside.   OB History   Grav Para Term Preterm Abortions TAB SAB Ect Mult Living   4 2 2  1 1    2       Past Medical History  Diagnosis Date  . UTI (urinary tract infection)   . Medical history non-contributory   . Asthma     Past Surgical History  Procedure Laterality Date  . Cystectomy    . Wisdom tooth extraction    . Pilonidal cyst excision      Family History  Problem Relation Age of Onset  . Alcohol abuse Neg Hx   . Arthritis Neg Hx   . Asthma Neg Hx   . Birth defects Neg Hx   . Cancer Neg Hx   . COPD Neg Hx   . Depression Neg Hx   . Diabetes Neg Hx   . Drug abuse Neg Hx   . Early death Neg Hx   . Hearing loss Neg Hx   . Heart disease Neg Hx   . Hyperlipidemia Neg Hx   . Hypertension Neg Hx   . Kidney disease Neg Hx   . Learning disabilities Neg Hx   . Mental illness Neg Hx   . Mental retardation Neg Hx   . Miscarriages / Stillbirths Neg Hx   . Stroke Neg Hx   . Vision loss Neg Hx     History  Substance Use Topics  . Smoking status: Former Smoker -- 0.25 packs/day    Types: Cigarettes  . Smokeless tobacco: Never Used     Comment: 4 CIGS A DAY- Stopped smoking when she had positive upt  . Alcohol Use: No    Allergies: No Known  Allergies  Prescriptions prior to admission  Medication Sig Dispense Refill  . albuterol (PROVENTIL HFA;VENTOLIN HFA) 108 (90 BASE) MCG/ACT inhaler Inhale 2 puffs into the lungs every 6 (six) hours as needed for wheezing.  1 Inhaler  2  . NIFEdipine (PROCARDIA-XL/ADALAT-CC/NIFEDICAL-XL) 30 MG 24 hr tablet Take 1 tablet (30 mg total) by mouth 2 (two) times daily.  60 tablet  5  . Prenatal Vit-Fe Fumarate-FA (PRENATAL VITAMINS) 28-0.8 MG TABS Take 1 tablet by mouth daily.  30 tablet  12  . progesterone (PROMETRIUM) 200 MG capsule Place 1 capsule (200 mg total) vaginally at bedtime.  30 capsule  5  . zolpidem (AMBIEN) 10 MG tablet Take 1 tablet (10 mg total) by mouth at bedtime as needed for sleep.  30 tablet  1    Review of Systems  Constitutional: Negative for fever, chills and malaise/fatigue.  HENT: Negative for hearing loss.   Eyes: Negative for blurred vision, double vision and photophobia.  Respiratory: Negative for shortness  of breath.   Cardiovascular: Negative for chest pain.  Gastrointestinal: Positive for abdominal pain (ctrx). Negative for heartburn, nausea, vomiting, diarrhea and constipation.  Genitourinary: Negative for dysuria and urgency.  Neurological: Negative for headaches.   Physical Exam   Blood pressure 114/62, pulse 99, temperature 99 F (37.2 C), temperature source Oral, resp. rate 18, height 5\' 4"  (1.626 m), weight 81.647 kg (180 lb), last menstrual period 09/15/2012, SpO2 100.00%.  Physical Exam  Nursing note and vitals reviewed. Constitutional: She appears well-developed and well-nourished. No distress.  HENT:  Head: Normocephalic and atraumatic.  Eyes: Conjunctivae and EOM are normal.  Neck: Normal range of motion. Neck supple.  Respiratory: Breath sounds normal.  GI: Soft. Bowel sounds are normal. She exhibits no distension and no mass. There is no tenderness. There is no rebound and no guarding.  Genitourinary: Vagina normal.  Skin: She is not  diaphoretic.  Dilation: Closed Effacement (%): Thick Station: -3 Exam by:: Odom MD  FHT: 150s mod var, 10x10 accel, one variable decel brief with quick recovery Toco: 5-24min apart,    MAU Course  Procedures  MDM Will hydrate pt and give phenergan. If improved, will discharge home with PTL precautions.  Assessment and Plan  SHANEEKA [name] is a 27 y.o. [id number] at [redacted]w[redacted]d presents for evaluation of preterm labor. Reassuring FFN from 2d prior, [place] with funnelling, NST reassuring and reactive. 1 variable decel no recurrent in 25wks not significant. Pt s/p betamethosone. Will continue home dose procardia, progesterone and follow up as previously scheduled.  Tawana Scale 03/17/2013, 11:17 PM

## 2013-03-17 NOTE — MAU Note (Signed)
Pt presents with continued contractions since 03/14/13.  Pt states they are every 7-8 minutes and they "get stronger and stronger it seems like".  Pt had fFN on 8/28 and betamethasone on 8/28 and 8/29.  Pt is using " little gray pill" twice a day for contractions and using a "pill in my vagina at night".  Pt denies bleeding or leakage of fluid.  Pt states she has nausea and headache, dizziness and blurry vision today.+ fm per pt.

## 2013-03-18 DIAGNOSIS — O479 False labor, unspecified: Secondary | ICD-10-CM

## 2013-03-19 ENCOUNTER — Encounter (HOSPITAL_COMMUNITY): Payer: Self-pay | Admitting: Family

## 2013-03-19 ENCOUNTER — Inpatient Hospital Stay (HOSPITAL_COMMUNITY)
Admission: AD | Admit: 2013-03-19 | Discharge: 2013-03-19 | Disposition: A | Discharge status: Home / Self Care | Payer: Medicaid Other | Source: Ambulatory Visit | Attending: Obstetrics & Gynecology | Admitting: Obstetrics & Gynecology

## 2013-03-19 DIAGNOSIS — O4700 False labor before 37 completed weeks of gestation, unspecified trimester: Secondary | ICD-10-CM | POA: Insufficient documentation

## 2013-03-19 DIAGNOSIS — O219 Vomiting of pregnancy, unspecified: Secondary | ICD-10-CM

## 2013-03-19 LAB — URINALYSIS, ROUTINE W REFLEX MICROSCOPIC
Bilirubin Urine: NEGATIVE
Glucose, UA: NEGATIVE mg/dL
Hgb urine dipstick: NEGATIVE
Ketones, ur: NEGATIVE mg/dL
Leukocytes, UA: NEGATIVE
Nitrite: NEGATIVE
Protein, ur: NEGATIVE mg/dL
Specific Gravity, Urine: 1.015 (ref 1.005–1.030)
Urobilinogen, UA: 1 mg/dL (ref 0.0–1.0)
pH: 6.5 (ref 5.0–8.0)

## 2013-03-19 LAB — FETAL FIBRONECTIN: Fetal Fibronectin: NEGATIVE

## 2013-03-19 MED ORDER — ONDANSETRON 8 MG PO TBDP
8.0000 mg | ORAL_TABLET | Freq: Once | ORAL | Status: AC
  Administered 2013-03-19: 8 mg via ORAL
  Filled 2013-03-19: qty 1

## 2013-03-19 MED ORDER — PROMETHAZINE HCL 12.5 MG PO TABS: 12.5000 mg | ORAL_TABLET | Freq: Four times a day (QID) | ORAL | Status: DC | PRN

## 2013-03-19 MED ORDER — PROMETHAZINE HCL 25 MG/ML IJ SOLN
25.0000 mg | Freq: Once | INTRAMUSCULAR | Status: AC
  Administered 2013-03-19: 25 mg via INTRAMUSCULAR
  Filled 2013-03-19: qty 1

## 2013-03-19 NOTE — MAU Provider Note (Signed)
History     CSN: [id number]  Arrival date and time: 03/19/13 1031   First Provider Initiated Contact with Patient 03/19/13 1116      Chief Complaint  Patient presents with  . Contractions   HPI Pt is a [id number] here at [redacted]w[redacted]d with report contractions that increased in intensity this morning.  Denies vaginal bleeding or leaking of fluid.  +fetal movement.  + nausea, denies vomiting.  Pt seen on 9/1 in MAU for preterm contractions.  Previously diagnosed with short cervix and funneling and negative FFN on 03/15/13.  Pt given BMZ at that time and discharged home on prometrium and procardia.  Pt states more concerned about feeling nauseous today.     Past Medical History  Diagnosis Date  . UTI (urinary tract infection)   . Medical history non-contributory   . Asthma     Past Surgical History  Procedure Laterality Date  . Cystectomy    . Wisdom tooth extraction    . Pilonidal cyst excision      Family History  Problem Relation Age of Onset  . Alcohol abuse Neg Hx   . Arthritis Neg Hx   . Asthma Neg Hx   . Birth defects Neg Hx   . Cancer Neg Hx   . COPD Neg Hx   . Depression Neg Hx   . Diabetes Neg Hx   . Drug abuse Neg Hx   . Early death Neg Hx   . Hearing loss Neg Hx   . Heart disease Neg Hx   . Hyperlipidemia Neg Hx   . Hypertension Neg Hx   . Kidney disease Neg Hx   . Learning disabilities Neg Hx   . Mental illness Neg Hx   . Mental retardation Neg Hx   . Miscarriages / Stillbirths Neg Hx   . Stroke Neg Hx   . Vision loss Neg Hx     History  Substance Use Topics  . Smoking status: Former Smoker -- 0.25 packs/day    Types: Cigarettes  . Smokeless tobacco: Never Used     Comment: 4 CIGS A DAY- Stopped smoking when she had positive upt  . Alcohol Use: No    Allergies: No Known Allergies  Prescriptions prior to admission  Medication Sig Dispense Refill  . NIFEdipine (PROCARDIA-XL/ADALAT-CC/NIFEDICAL-XL) 30 MG 24 hr tablet Take 1 tablet (30 mg total) by mouth  2 (two) times daily.  60 tablet  5  . Prenatal Vit-Fe Fumarate-FA (PRENATAL MULTIVITAMIN) TABS tablet Take 1 tablet by mouth daily at 12 noon.      . progesterone (PROMETRIUM) 200 MG capsule Place 1 capsule (200 mg total) vaginally at bedtime.  30 capsule  5  . albuterol (PROVENTIL HFA;VENTOLIN HFA) 108 (90 BASE) MCG/ACT inhaler Inhale 2 puffs into the lungs every 6 (six) hours as needed for wheezing.  1 Inhaler  2  . zolpidem (AMBIEN) 10 MG tablet Take 1 tablet (10 mg total) by mouth at bedtime as needed for sleep.  30 tablet  1    Review of Systems  Constitutional: Negative for fever and chills.  Gastrointestinal: Positive for nausea and abdominal pain (contractions). Negative for vomiting.  Genitourinary: Negative.   All other systems reviewed and are negative.   Physical Exam   Height 5\' 4"  (1.626 m), weight 81.818 kg (180 lb 6 oz), last menstrual period 09/15/2012. There were no vitals filed for this visit.  Physical Exam  Constitutional: She is oriented to person, place, and time. She appears well-developed  and well-nourished.  HENT:  Head: Normocephalic.  Neck: Normal range of motion. Neck supple.  Cardiovascular: Normal rate, regular rhythm and normal heart sounds.   Respiratory: Effort normal and breath sounds normal.  GI: Soft. There is no tenderness.  Genitourinary: No bleeding around the vagina. Vaginal discharge (mucusy) found.  Neurological: She is alert and oriented to person, place, and time.  Skin: Skin is warm and dry.  Dilation: Closed Effacement (%): Thick Cervical Position: Posterior Exam by:: W. Muhammd, CNM  FHR 150's, +accels, no decels Toco - irregular, 6-10  MAU Course  Procedures UA negative ketones or leuks  IM Phenergan > pt reports relief in nausea  Cervix rechecked > closed, pt reports decreased contractions  Assessment and Plan  [id number] at 25 wks IUP  Braxton Hicks Nausea of Pregnancy  Plan: Discharge to home Preterm labor  precautions Keep scheduled appointment  [facility] 03/19/2013, 11:18 AM

## 2013-03-19 NOTE — MAU Note (Signed)
Patient is in with c/o new onset pelvic pressure, nausea and contractions since this morning. She denies vaginal bleeding or lof. She reports good fetal movement.

## 2013-03-19 NOTE — MAU Note (Signed)
Patient appears to be uncomfortable, she is moving about in bed, on her knees. Unable to attain fht with cardio, will attempt with a doppler.

## 2013-03-25 NOTE — MAU Provider Note (Signed)
Attestation of Attending Supervision of Advanced Practitioner (CNM/NP): Evaluation and management procedures were performed by the Advanced Practitioner under my supervision and collaboration.  I have reviewed the Advanced Practitioner's note and chart, and I agree with the management and plan.  HARRAWAY-SMITH, CAROLYN 7:52 PM     

## 2013-03-28 ENCOUNTER — Encounter: Payer: Medicaid Other | Admitting: Obstetrics & Gynecology

## 2013-04-11 ENCOUNTER — Encounter: Payer: Self-pay | Admitting: Obstetrics and Gynecology

## 2013-04-11 ENCOUNTER — Ambulatory Visit (INDEPENDENT_AMBULATORY_CARE_PROVIDER_SITE_OTHER): Payer: Medicaid Other | Admitting: Obstetrics and Gynecology

## 2013-04-11 VITALS — BP 109/70 | Temp 97.9°F

## 2013-04-11 DIAGNOSIS — Z23 Encounter for immunization: Secondary | ICD-10-CM

## 2013-04-11 DIAGNOSIS — Z3483 Encounter for supervision of other normal pregnancy, third trimester: Secondary | ICD-10-CM

## 2013-04-11 DIAGNOSIS — O26873 Cervical shortening, third trimester: Secondary | ICD-10-CM

## 2013-04-11 DIAGNOSIS — O26879 Cervical shortening, unspecified trimester: Secondary | ICD-10-CM

## 2013-04-11 LAB — CBC
HCT: 31.1 % — ABNORMAL LOW (ref 36.0–46.0)
Hemoglobin: 10.7 g/dL — ABNORMAL LOW (ref 12.0–15.0)
MCH: 30.6 pg (ref 26.0–34.0)
MCHC: 34.4 g/dL (ref 30.0–36.0)
MCV: 88.9 fL (ref 78.0–100.0)
Platelets: 238 10*3/uL (ref 150–400)
RBC: 3.5 MIL/uL — ABNORMAL LOW (ref 3.87–5.11)
RDW: 13.9 % (ref 11.5–15.5)
WBC: 12.1 10*3/uL — ABNORMAL HIGH (ref 4.0–10.5)

## 2013-04-11 LAB — POCT URINALYSIS DIP (DEVICE)
Bilirubin Urine: NEGATIVE
Glucose, UA: NEGATIVE mg/dL
Hgb urine dipstick: NEGATIVE
Ketones, ur: NEGATIVE mg/dL
Leukocytes, UA: NEGATIVE
Nitrite: NEGATIVE
Protein, ur: NEGATIVE mg/dL
Specific Gravity, Urine: 1.015 (ref 1.005–1.030)
Urobilinogen, UA: 0.2 mg/dL (ref 0.0–1.0)
pH: 7.5 (ref 5.0–8.0)

## 2013-04-11 MED ORDER — TETANUS-DIPHTH-ACELL PERTUSSIS 5-2.5-18.5 LF-MCG/0.5 IM SUSP
0.5000 mL | Freq: Once | INTRAMUSCULAR | Status: AC
  Administered 2013-04-11: 0.5 mL via INTRAMUSCULAR

## 2013-04-11 MED ORDER — PANTOPRAZOLE SODIUM 40 MG PO TBEC: 40.0000 mg | DELAYED_RELEASE_TABLET | Freq: Every day | ORAL | Status: DC

## 2013-04-11 MED ORDER — ZOLPIDEM TARTRATE 10 MG PO TABS: 10.0000 mg | ORAL_TABLET | Freq: Every evening | ORAL | Status: DC | PRN

## 2013-04-11 NOTE — Addendum Note (Signed)
Addended by: Franchot Mimes on: 04/11/2013 12:13 PM   Modules accepted: Orders

## 2013-04-11 NOTE — Patient Instructions (Signed)

## 2013-04-11 NOTE — Addendum Note (Signed)
Addended by: Toula Moos on: 04/11/2013 12:21 PM   Modules accepted: Orders

## 2013-04-11 NOTE — Progress Notes (Signed)
P=97 Pt. Requests refill for ambien.

## 2013-04-11 NOTE — Addendum Note (Signed)
Addended by: Catalina Antigua on: 04/11/2013 12:15 PM   Modules accepted: Orders

## 2013-04-11 NOTE — Progress Notes (Signed)
Was hosp 4 weeks ago with [facility], short cervix, on Procardia and prometrium, still has BHC, no VB or LOF. Needs f/u US for cx and LL placenta. Protonix for GERD

## 2013-04-12 ENCOUNTER — Encounter: Payer: Self-pay | Admitting: Obstetrics and Gynecology

## 2013-04-12 LAB — RPR

## 2013-04-12 LAB — GLUCOSE TOLERANCE, 1 HOUR (50G) W/O FASTING: Glucose, 1 Hour GTT: 104 mg/dL (ref 70–140)

## 2013-04-13 ENCOUNTER — Encounter: Payer: Self-pay | Admitting: *Deleted

## 2013-04-16 ENCOUNTER — Other Ambulatory Visit: Payer: Self-pay | Admitting: Obstetrics & Gynecology

## 2013-04-16 ENCOUNTER — Ambulatory Visit (HOSPITAL_COMMUNITY)
Admission: RE | Admit: 2013-04-16 | Discharge: 2013-04-16 | Disposition: A | Discharge status: Home / Self Care | Payer: Medicaid Other | Source: Ambulatory Visit | Attending: Obstetrics & Gynecology | Admitting: Obstetrics & Gynecology

## 2013-04-16 ENCOUNTER — Ambulatory Visit (HOSPITAL_COMMUNITY): Admission: RE | Admit: 2013-04-16 | Payer: Medicaid Other | Source: Ambulatory Visit

## 2013-04-16 DIAGNOSIS — O26873 Cervical shortening, third trimester: Secondary | ICD-10-CM

## 2013-04-16 DIAGNOSIS — O26879 Cervical shortening, unspecified trimester: Secondary | ICD-10-CM | POA: Insufficient documentation

## 2013-04-16 DIAGNOSIS — O4400 Placenta previa specified as without hemorrhage, unspecified trimester: Secondary | ICD-10-CM | POA: Insufficient documentation

## 2013-04-16 DIAGNOSIS — O99330 Smoking (tobacco) complicating pregnancy, unspecified trimester: Secondary | ICD-10-CM | POA: Insufficient documentation

## 2013-04-23 ENCOUNTER — Telehealth: Payer: Self-pay | Admitting: *Deleted

## 2013-04-23 NOTE — Telephone Encounter (Signed)
Patient left message that she is having problems with hemorrhoids. I advised that she use sitz baths and use prep-h and tucks pads. Patient agrees and will followup on Thursday as scheduled.

## 2013-04-25 ENCOUNTER — Ambulatory Visit (INDEPENDENT_AMBULATORY_CARE_PROVIDER_SITE_OTHER): Payer: Medicaid Other | Admitting: Family Medicine

## 2013-04-25 ENCOUNTER — Inpatient Hospital Stay (HOSPITAL_COMMUNITY)
Admission: AD | Admit: 2013-04-25 | Discharge: 2013-04-25 | Disposition: A | Discharge status: Home / Self Care | Payer: Medicaid Other | Source: Ambulatory Visit | Attending: Obstetrics & Gynecology | Admitting: Obstetrics & Gynecology

## 2013-04-25 ENCOUNTER — Encounter (HOSPITAL_COMMUNITY): Payer: Self-pay

## 2013-04-25 VITALS — BP 109/73 | Wt 187.1 lb

## 2013-04-25 DIAGNOSIS — O479 False labor, unspecified: Secondary | ICD-10-CM

## 2013-04-25 DIAGNOSIS — Z3490 Encounter for supervision of normal pregnancy, unspecified, unspecified trimester: Secondary | ICD-10-CM

## 2013-04-25 DIAGNOSIS — R109 Unspecified abdominal pain: Secondary | ICD-10-CM | POA: Insufficient documentation

## 2013-04-25 DIAGNOSIS — O26873 Cervical shortening, third trimester: Secondary | ICD-10-CM

## 2013-04-25 DIAGNOSIS — Z23 Encounter for immunization: Secondary | ICD-10-CM

## 2013-04-25 DIAGNOSIS — O4700 False labor before 37 completed weeks of gestation, unspecified trimester: Secondary | ICD-10-CM | POA: Insufficient documentation

## 2013-04-25 DIAGNOSIS — O26879 Cervical shortening, unspecified trimester: Secondary | ICD-10-CM | POA: Insufficient documentation

## 2013-04-25 DIAGNOSIS — O99891 Other specified diseases and conditions complicating pregnancy: Secondary | ICD-10-CM

## 2013-04-25 DIAGNOSIS — O4703 False labor before 37 completed weeks of gestation, third trimester: Secondary | ICD-10-CM

## 2013-04-25 LAB — POCT URINALYSIS DIP (DEVICE)
Bilirubin Urine: NEGATIVE
Glucose, UA: NEGATIVE mg/dL
Hgb urine dipstick: NEGATIVE
Ketones, ur: NEGATIVE mg/dL
Leukocytes, UA: NEGATIVE
Nitrite: NEGATIVE
Protein, ur: NEGATIVE mg/dL
Specific Gravity, Urine: 1.02 (ref 1.005–1.030)
Urobilinogen, UA: 0.2 mg/dL (ref 0.0–1.0)
pH: 7.5 (ref 5.0–8.0)

## 2013-04-25 MED ORDER — BETAMETHASONE SOD PHOS & ACET 6 (3-3) MG/ML IJ SUSP
12.0000 mg | Freq: Once | INTRAMUSCULAR | Status: AC
  Administered 2013-04-25: 12 mg via INTRAMUSCULAR
  Filled 2013-04-25: qty 2

## 2013-04-25 MED ORDER — BETAMETHASONE SOD PHOS & ACET 6 (3-3) MG/ML IJ SUSP: 12.0000 mg | Freq: Once | INTRAMUSCULAR | Status: DC

## 2013-04-25 NOTE — Patient Instructions (Signed)

## 2013-04-25 NOTE — MAU Note (Signed)
Sent up from [facility] ([place]). On meds for PTL.  Is contracting

## 2013-04-25 NOTE — Addendum Note (Signed)
Addended by: Faythe Casa on: 04/25/2013 04:00 PM   Modules accepted: Orders

## 2013-04-25 NOTE — Progress Notes (Signed)
Pulse: 88 Patient would like an RX for [facility] [address] to get whole Lactaid milk instead of 2%. Also would like refill on ambien.  She is having lots of problems with hemorrhoids. Pt would like flu shot.

## 2013-04-25 NOTE — MAU Provider Note (Signed)
@MAUPATCONTACT @  Chief Complaint:  Labor Eval and [place] for cervical length and steroid inj    Stacy [name] is  27 y.o. [id number] at [redacted]w[redacted]d presents for Cramping and contractions appreciated in the office. She was sent to MAU for BMZ x1 after being seen in clinic. Initial thought was to have cervical length evaluation, however patient is >28 weeks, thus cervical length evaluation is not needed. Her last cervical length was at 9/30 and at that time it measured 2.78cm. Currently patient is not feeling contractions. She is comfortable. Denies leaking, gush of fluid or vaginal bleeding. She has felt her baby move within in the last hour.   Obstetrical/Gynecological History: OB History   Grav Para Term Preterm Abortions TAB SAB Ect Mult Living   4 2 2  1 1    2      Past Medical History: Past Medical History  Diagnosis Date  . UTI (urinary tract infection)   . Medical history non-contributory   . Asthma     Past Surgical History: Past Surgical History  Procedure Laterality Date  . Cystectomy    . Wisdom tooth extraction    . Pilonidal cyst excision      Family History: Family History  Problem Relation Age of Onset  . Alcohol abuse Neg Hx   . Arthritis Neg Hx   . Asthma Neg Hx   . Birth defects Neg Hx   . Cancer Neg Hx   . COPD Neg Hx   . Depression Neg Hx   . Diabetes Neg Hx   . Drug abuse Neg Hx   . Early death Neg Hx   . Hearing loss Neg Hx   . Heart disease Neg Hx   . Hyperlipidemia Neg Hx   . Hypertension Neg Hx   . Kidney disease Neg Hx   . Learning disabilities Neg Hx   . Mental illness Neg Hx   . Mental retardation Neg Hx   . Miscarriages / Stillbirths Neg Hx   . Stroke Neg Hx   . Vision loss Neg Hx     Social History: History  Substance Use Topics  . Smoking status: Former Smoker -- 0.25 packs/day    Types: Cigarettes  . Smokeless tobacco: Never Used     Comment: 4 CIGS A DAY- Stopped smoking when she had positive upt  . Alcohol Use: No    Allergies:  No Known Allergies  Meds:  Prescriptions prior to admission  Medication Sig Dispense Refill  . NIFEdipine (PROCARDIA-XL/ADALAT-CC/NIFEDICAL-XL) 30 MG 24 hr tablet Take 1 tablet (30 mg total) by mouth 2 (two) times daily.  60 tablet  5  . pantoprazole (PROTONIX) 40 MG tablet Take 1 tablet (40 mg total) by mouth daily.  30 tablet  2  . Prenatal Vit-Fe Fumarate-FA (PRENATAL MULTIVITAMIN) TABS tablet Take 1 tablet by mouth daily at 12 noon.      . progesterone (PROMETRIUM) 200 MG capsule Place 1 capsule (200 mg total) vaginally at bedtime.  30 capsule  5  . promethazine (PHENERGAN) 12.5 MG tablet Take 1 tablet (12.5 mg total) by mouth every 6 (six) hours as needed for nausea.  30 tablet  0  . zolpidem (AMBIEN) 10 MG tablet Take 1 tablet (10 mg total) by mouth at bedtime as needed for sleep.  30 tablet  1  . albuterol (PROVENTIL HFA;VENTOLIN HFA) 108 (90 BASE) MCG/ACT inhaler Inhale 2 puffs into the lungs every 6 (six) hours as needed for wheezing.  1 Inhaler  2    Review of Systems -   Review of Systems  Constitutional: Negative for fever, chills, weight loss, malaise/fatigue and diaphoresis.  HENT: Negative for hearing loss, ear pain, nosebleeds, congestion, sore throat, neck pain, tinnitus and ear discharge.   Eyes: Negative for blurred vision, double vision, photophobia, pain, discharge and redness.  Respiratory: Negative for cough, hemoptysis, sputum production, shortness of breath, wheezing and stridor.   Cardiovascular: Negative for chest pain, palpitations, orthopnea,  leg swelling  Gastrointestinal: Negative for abdominal pain heartburn, nausea, vomiting, diarrhea, constipation, blood in stool Genitourinary: Negative for dysuria, urgency, frequency, hematuria and flank pain.  Musculoskeletal: Negative for myalgias, back pain, joint pain and falls.  Skin: Negative for itching and rash.  Neurological: Negative for dizziness, tingling, tremors, sensory change, speech change, focal  weakness, seizures, loss of consciousness, weakness and headaches.  Endo/Heme/Allergies: Negative for environmental allergies and polydipsia. Does not bruise/bleed easily.  Psychiatric/Behavioral: Negative for depression, suicidal ideas, hallucinations, memory loss and substance abuse. The patient is not nervous/anxious and does not have insomnia.      Physical Exam  Blood pressure 118/58, pulse 91, temperature 98.4 F (36.9 C), temperature source Oral, resp. rate 16, last menstrual period 09/15/2012. GENERAL: Well-developed, well-nourished female in no acute distress.  LUNGS: Clear to auscultation bilaterally.  HEART: Regular rate and rhythm. ABDOMEN: Soft, nontender, nondistended, gravid.  EXTREMITIES: Nontender, no edema, 2+ distal pulses.  FHT:  Baseline rate 140's bpm   Variability moderate  Accelerations present   Decelerations none Contractions: irritably, no notable contractions.     Labs: Results for orders placed in visit on 04/25/13 (from the past 24 hour(s))  POCT URINALYSIS DIP (DEVICE)   Collection Time    04/25/13 11:34 AM      Result Value Range   Glucose, UA NEGATIVE  NEGATIVE mg/dL   Bilirubin Urine NEGATIVE  NEGATIVE   Ketones, ur NEGATIVE  NEGATIVE mg/dL   Specific Gravity, Urine 1.020  1.005 - 1.030   Hgb urine dipstick NEGATIVE  NEGATIVE   pH 7.5  5.0 - 8.0   Protein, ur NEGATIVE  NEGATIVE mg/dL   Urobilinogen, UA 0.2  0.0 - 1.0 mg/dL   Nitrite NEGATIVE  NEGATIVE   Leukocytes, UA NEGATIVE  NEGATIVE   Imaging Studies:  US Ob Transvaginal  04/16/2013   OBSTETRICAL ULTRASOUND: This exam was performed within a [facility]. The OB US report was generated in the [facility] system, and faxed to the ordering physician.   This report is also available in [employer] and in the [employer]. See [facility] Obstetric US report.   Assessment/Plan: Stacy [name] is  27 y.o. [id number] at [redacted]w[redacted]d cramping and contractions appreciated in the  office. She was sent to MAU for BMZ x1 after being seen in clinic and complaining of contractions in the setting of known shortened cervix. . - Discharge home with Preterm labor precautions.  - Discussed fetal kick counts: Pt to perform daily at a time when the baby is active, lie laterally with both hands on belly in quiet room and count all movements (hiccups, shoulder rolls, obvious kicks, etc); pt is to report to clinic or MAU for less than 10 movements felt in a one hour time period-pt told as soon as she counts 10 movements the count is complete.)  - Return to MAU tomorrow at 2:30 pm to have second steroid shot (Orders have been placed)  - F/U in clinic on your next scheduled appointment.  - RTC  for any VB, regular, painful cramps/ctxs occurring at a rate of >2/10 min, fever (100.5 or higher), n/v/d, any pain that is unresolving or worsening, LOF, decreased fetal movement, CP, SOB, edema    Kuneff, Renee 10/9/20142:54 PM  I have seen and examined this patient and agree with above documentation in the resident's note. Pt with a known hx of shortened cervix on PV progesterone (last check 2.7cm on 9/30) who was seen in clinic this AM with contractions every 7 min and SVE of 1/20/-2. Sent here for CL and BMZ (last received end of August). After discussion with Dr. Debroah Loop and Dr. Claudean Severance, no indication after 28 weeks to check CL so will not plan for [place].  BMZ x1 given here. Pt to return tomorrow to MAU for second dose as no ctx on TOCO and FWB cat I tracing.  PTL precautions discussed.   Rulon Abide, M.D. [facility] Fellow 04/25/2013 4:02 PM

## 2013-04-25 NOTE — Progress Notes (Signed)
  Subjective:    Stacy [name] is a 27 y.o. female being seen today for her obstetrical visit. She is at [redacted]w[redacted]d gestation. Patient reports contractions since last 48 hrs q7-29min, no bleeding, no cramping and no leaking. Fetal movement: normal.  Contractions not responding to Procardia  Menstrual History: OB History   Grav Para Term Preterm Abortions TAB SAB Ect Mult Living   4 2 2  1 1    2      In [facility] for shortend cervix. Reevaluated 9/30 and at 2.7cm and on progrestone 200mg  PV   Patient's last menstrual period was 09/15/2012.    The following portions of the patient's history were reviewed and updated as appropriate: allergies, current medications, past family history, past medical history, past social history, past surgical history and problem list.  Review of Systems Pertinent items are noted in HPI.   Objective:    BP 109/73  Wt 187 lb 1.6 oz (84.868 kg)  BMI 32.1 kg/m2  LMP 09/15/2012 FHT:  131BPM  Uterine Size: 29 cm and size equals dates  Presentation:      Assessment:   Stacy [name] is a 27 y.o. [id number] at [redacted]w[redacted]d here for ROB visit.  Painful ctx, q54min, shortened cervix. Send to MAU for BMZ and cervical length. If normal return for repeat BMZ. Unlikely labor given no bloody show, but will eval further withUS.  Discussed with Patient:  -Plans to breast/bottle feed.  All questions answered. -Continue prenatal vitamins. -Reviewed fetal kick counts (Pt to perform daily at a time when the baby is active, lie laterally with both hands on belly in quiet room and count all movements (hiccups, shoulder rolls, obvious kicks, etc); pt is to report to clinic or MAU for less than 10 movements felt in a one hour time period-pt told as soon as she counts 10 movements the count is complete.)  - Routine precautions discussed (depression, infection s/s).   Patient provided with all pertinent phone numbers for emergencies. - RTC for any VB, regular, painful cramps/ctxs  occurring at a rate of >2/10 min, fever (100.5 or higher), n/v/d, any pain that is unresolving or worsening, LOF, decreased fetal movement, CP, SOB, edema   Problems: Patient Active Problem List   Diagnosis Date Noted  . Short cervix affecting pregnancy 03/14/2013  . Other current maternal conditions classifiable elsewhere, antepartum 01/31/2013  . Supervision of other normal pregnancy 01/14/2013  . Insomnia 01/02/2013  . Normal intrauterine pregnancy on prenatal ultrasound 12/05/2012  . History of pilonidal cyst 03/17/2011    To Do: 1. Flu shot today 2. Send to MAU for CL and BMZ  Discussed  With Dr. Debroah Loop and Dr. Dorann Ou

## 2013-04-26 ENCOUNTER — Inpatient Hospital Stay (HOSPITAL_COMMUNITY)
Admission: AD | Admit: 2013-04-26 | Discharge: 2013-04-26 | Disposition: A | Discharge status: Home / Self Care | Payer: Medicaid Other | Source: Ambulatory Visit | Attending: Obstetrics & Gynecology | Admitting: Obstetrics & Gynecology

## 2013-04-26 DIAGNOSIS — O4700 False labor before 37 completed weeks of gestation, unspecified trimester: Secondary | ICD-10-CM | POA: Insufficient documentation

## 2013-04-26 MED ORDER — BETAMETHASONE SOD PHOS & ACET 6 (3-3) MG/ML IJ SUSP
12.0000 mg | Freq: Once | INTRAMUSCULAR | Status: AC
  Administered 2013-04-26: 12 mg via INTRAMUSCULAR
  Filled 2013-04-26: qty 2

## 2013-04-26 NOTE — MAU Note (Signed)
Patient is not in the lobby when called to triage to give the injection.

## 2013-05-09 ENCOUNTER — Ambulatory Visit (INDEPENDENT_AMBULATORY_CARE_PROVIDER_SITE_OTHER): Payer: Medicaid Other | Admitting: Obstetrics & Gynecology

## 2013-05-09 VITALS — BP 103/63 | Wt 191.0 lb

## 2013-05-09 DIAGNOSIS — O26879 Cervical shortening, unspecified trimester: Secondary | ICD-10-CM

## 2013-05-09 DIAGNOSIS — O26873 Cervical shortening, third trimester: Secondary | ICD-10-CM

## 2013-05-09 LAB — POCT URINALYSIS DIP (DEVICE)
Bilirubin Urine: NEGATIVE
Glucose, UA: NEGATIVE mg/dL
Hgb urine dipstick: NEGATIVE
Ketones, ur: NEGATIVE mg/dL
Nitrite: NEGATIVE
Protein, ur: NEGATIVE mg/dL
Specific Gravity, Urine: 1.025 (ref 1.005–1.030)
Urobilinogen, UA: 0.2 mg/dL (ref 0.0–1.0)
pH: 6 (ref 5.0–8.0)

## 2013-05-09 NOTE — Patient Instructions (Signed)
Return to clinic for any obstetric concerns or go to MAU for evaluation  

## 2013-05-09 NOTE — Progress Notes (Signed)
Desires BTL, will sign papers today.  No other complaints or concerns.  Fetal movement and labor precautions reviewed.

## 2013-05-09 NOTE — Progress Notes (Signed)
Pulse: 86

## 2013-05-10 ENCOUNTER — Encounter: Payer: Self-pay | Admitting: *Deleted

## 2013-05-10 DIAGNOSIS — Z3480 Encounter for supervision of other normal pregnancy, unspecified trimester: Secondary | ICD-10-CM

## 2013-05-10 DIAGNOSIS — Z3490 Encounter for supervision of normal pregnancy, unspecified, unspecified trimester: Secondary | ICD-10-CM

## 2013-05-23 ENCOUNTER — Ambulatory Visit (INDEPENDENT_AMBULATORY_CARE_PROVIDER_SITE_OTHER): Payer: Medicaid Other | Admitting: Obstetrics & Gynecology

## 2013-05-23 VITALS — BP 124/67 | Temp 97.0°F | Wt 191.4 lb

## 2013-05-23 DIAGNOSIS — O26879 Cervical shortening, unspecified trimester: Secondary | ICD-10-CM

## 2013-05-23 DIAGNOSIS — K219 Gastro-esophageal reflux disease without esophagitis: Secondary | ICD-10-CM

## 2013-05-23 DIAGNOSIS — O26873 Cervical shortening, third trimester: Secondary | ICD-10-CM

## 2013-05-23 LAB — POCT URINALYSIS DIP (DEVICE)
Bilirubin Urine: NEGATIVE
Glucose, UA: NEGATIVE mg/dL
Hgb urine dipstick: NEGATIVE
Ketones, ur: NEGATIVE mg/dL
Nitrite: NEGATIVE
Protein, ur: NEGATIVE mg/dL
Specific Gravity, Urine: 1.02 (ref 1.005–1.030)
Urobilinogen, UA: 1 mg/dL (ref 0.0–1.0)
pH: 7.5 (ref 5.0–8.0)

## 2013-05-23 MED ORDER — PANTOPRAZOLE SODIUM 40 MG PO TBEC: 40.0000 mg | DELAYED_RELEASE_TABLET | Freq: Every day | ORAL | Status: DC

## 2013-05-23 NOTE — Progress Notes (Signed)
Clear fluid discharge for one week. Only scant white d/c seen today, wet prep done

## 2013-05-23 NOTE — Patient Instructions (Signed)
Preterm Labor Information Preterm labor is when labor starts at less than 37 weeks of pregnancy. The normal length of a pregnancy is 39 to 41 weeks. CAUSES Often, there is no identifiable underlying cause as to why a woman goes into preterm labor. One of the most common known causes of preterm labor is infection. Infections of the uterus, cervix, vagina, amniotic sac, bladder, kidney, or even the lungs (pneumonia) can cause labor to start. Other suspected causes of preterm labor include:   Urogenital infections, such as yeast infections and bacterial vaginosis.   Uterine abnormalities (uterine shape, uterine septum, fibroids, or bleeding from the placenta).   A cervix that has been operated on (it may fail to stay closed).   Malformations in the fetus.   Multiple gestations (twins, triplets, and so on).   Breakage of the amniotic sac.  RISK FACTORS  Having a previous history of preterm labor.   Having premature rupture of membranes (PROM).   Having a placenta that covers the opening of the cervix (placenta previa).   Having a placenta that separates from the uterus (placental abruption).   Having a cervix that is too weak to hold the fetus in the uterus (incompetent cervix).   Having too much fluid in the amniotic sac (polyhydramnios).   Taking illegal drugs or smoking while pregnant.   Not gaining enough weight while pregnant.   Being younger than 18 and older than 28 years old.   Having a low socioeconomic status.   Being African American. SYMPTOMS Signs and symptoms of preterm labor include:   Menstrual-like cramps, abdominal pain, or back pain.  Uterine contractions that are regular, as frequent as six in an hour, regardless of their intensity (may be mild or painful).  Contractions that start on the top of the uterus and spread down to the lower abdomen and back.   A sense of increased pelvic pressure.   A watery or bloody mucus discharge that  comes from the vagina.  TREATMENT Depending on the length of the pregnancy and other circumstances, your health care provider may suggest bed rest. If necessary, there are medicines that can be given to stop contractions and to mature the fetal lungs. If labor happens before 34 weeks of pregnancy, a prolonged hospital stay may be recommended. Treatment depends on the condition of both you and the fetus.  WHAT SHOULD YOU DO IF YOU THINK YOU ARE IN PRETERM LABOR? Call your health care provider right away. You will need to go to the hospital to get checked immediately. HOW CAN YOU PREVENT PRETERM LABOR IN FUTURE PREGNANCIES? You should:   Stop smoking if you smoke.  Maintain healthy weight gain and avoid chemicals and drugs that are not necessary.  Be watchful for any type of infection.  Inform your health care provider if you have a known history of preterm labor. Document Released: 09/24/2003 Document Revised: 03/06/2013 Document Reviewed: 08/06/2012 ExitCare Patient Information 2014 [employer].    

## 2013-05-23 NOTE — Progress Notes (Signed)
P= 98 Pt. States she's been "leaking clear fluid" for the last 3-4 days and has had to wear change underwear about 3-4X per day. C/o of vaginal pressure continuously. Intermittent contractions, states goes on for every 5-7 minutes then stops for a few hours.

## 2013-05-24 LAB — WET PREP, GENITAL
Clue Cells Wet Prep HPF POC: NONE SEEN
Trich, Wet Prep: NONE SEEN

## 2013-05-28 ENCOUNTER — Telehealth: Payer: Self-pay | Admitting: *Deleted

## 2013-05-28 MED ORDER — FLUCONAZOLE 150 MG PO TABS: ORAL_TABLET | ORAL | Status: DC

## 2013-05-28 NOTE — Telephone Encounter (Signed)
Called pt and informed her of wet prep result showing +yeast. Pt states she is still having vaginal d/c and now some itching. She desires treatment. Rx sent to pharmacy.  Pt also requests to have urine drug screen at her next visit on 11/18. Notation made on her appt.

## 2013-05-28 NOTE — Telephone Encounter (Signed)
Pt called nurse line for lab results.

## 2013-05-30 ENCOUNTER — Ambulatory Visit (INDEPENDENT_AMBULATORY_CARE_PROVIDER_SITE_OTHER): Payer: Medicaid Other | Admitting: Family

## 2013-05-30 VITALS — BP 119/71 | Temp 98.6°F | Wt 191.7 lb

## 2013-05-30 DIAGNOSIS — O26879 Cervical shortening, unspecified trimester: Secondary | ICD-10-CM

## 2013-05-30 DIAGNOSIS — O0990 Supervision of high risk pregnancy, unspecified, unspecified trimester: Secondary | ICD-10-CM

## 2013-05-30 LAB — POCT URINALYSIS DIP (DEVICE)
Bilirubin Urine: NEGATIVE
Glucose, UA: NEGATIVE mg/dL
Hgb urine dipstick: NEGATIVE
Ketones, ur: NEGATIVE mg/dL
Nitrite: NEGATIVE
Protein, ur: NEGATIVE mg/dL
Specific Gravity, Urine: 1.02 (ref 1.005–1.030)
Urobilinogen, UA: 0.2 mg/dL (ref 0.0–1.0)
pH: 7.5 (ref 5.0–8.0)

## 2013-05-30 MED ORDER — ZOLPIDEM TARTRATE 10 MG PO TABS: 10.0000 mg | ORAL_TABLET | Freq: Every evening | ORAL | Status: DC | PRN

## 2013-05-30 NOTE — Progress Notes (Signed)
Reports more frequently occuring contractions, at times every 10 minutes.  No report of vaginal bleeding or leaking of fluid.  2/30/-2.  Reviewed signs of labor.  Pt requests UDS due to being around her sister who smokes a lot of marijuana, wants to see if her test result may be positive due to exposure.

## 2013-05-30 NOTE — Progress Notes (Signed)
Pulse- 87 Patient requests refill on ambien; reports pain/pressure in abdomen and pelvis as well as irregular contractions

## 2013-05-31 LAB — DRUG SCREEN, URINE
Amphetamine Screen, Ur: NEGATIVE
Barbiturate Quant, Ur: NEGATIVE
Benzodiazepines.: NEGATIVE
Cocaine Metabolites: NEGATIVE
Creatinine,U: 65.11 mg/dL
Marijuana Metabolite: NEGATIVE
Methadone: NEGATIVE
Opiates: NEGATIVE
Phencyclidine (PCP): NEGATIVE
Propoxyphene: NEGATIVE

## 2013-06-06 ENCOUNTER — Encounter: Payer: Self-pay | Admitting: Obstetrics and Gynecology

## 2013-06-06 ENCOUNTER — Ambulatory Visit (INDEPENDENT_AMBULATORY_CARE_PROVIDER_SITE_OTHER): Payer: Medicaid Other | Admitting: Obstetrics and Gynecology

## 2013-06-06 VITALS — BP 110/68 | Temp 97.7°F | Wt 189.1 lb

## 2013-06-06 DIAGNOSIS — Z3483 Encounter for supervision of other normal pregnancy, third trimester: Secondary | ICD-10-CM

## 2013-06-06 DIAGNOSIS — O26873 Cervical shortening, third trimester: Secondary | ICD-10-CM

## 2013-06-06 DIAGNOSIS — O26879 Cervical shortening, unspecified trimester: Secondary | ICD-10-CM

## 2013-06-06 LAB — POCT URINALYSIS DIP (DEVICE)
Glucose, UA: NEGATIVE mg/dL
Nitrite: NEGATIVE
Protein, ur: 30 mg/dL — AB
Specific Gravity, Urine: 1.025 (ref 1.005–1.030)
Urobilinogen, UA: 1 mg/dL (ref 0.0–1.0)
pH: 7 (ref 5.0–8.0)

## 2013-06-06 LAB — OB RESULTS CONSOLE GBS: GBS: NEGATIVE

## 2013-06-06 NOTE — Progress Notes (Signed)
Pulse-67  Pain/pressure- lower back, stomach, lower abd

## 2013-06-06 NOTE — Progress Notes (Signed)
Patient doing well without complaints. FM/PTL precautions reviewed. Cultures collected today

## 2013-06-07 LAB — GC/CHLAMYDIA PROBE AMP
CT Probe RNA: NEGATIVE
GC Probe RNA: NEGATIVE

## 2013-06-09 LAB — CULTURE, BETA STREP (GROUP B ONLY)

## 2013-06-10 ENCOUNTER — Encounter: Payer: Self-pay | Admitting: Obstetrics and Gynecology

## 2013-06-11 ENCOUNTER — Encounter: Payer: Self-pay | Admitting: Obstetrics & Gynecology

## 2013-06-11 ENCOUNTER — Ambulatory Visit (INDEPENDENT_AMBULATORY_CARE_PROVIDER_SITE_OTHER): Payer: Medicaid Other | Admitting: Obstetrics & Gynecology

## 2013-06-11 VITALS — BP 120/72 | Temp 97.2°F | Wt 190.2 lb

## 2013-06-11 DIAGNOSIS — O3433 Maternal care for cervical incompetence, third trimester: Secondary | ICD-10-CM

## 2013-06-11 DIAGNOSIS — Z3483 Encounter for supervision of other normal pregnancy, third trimester: Secondary | ICD-10-CM

## 2013-06-11 DIAGNOSIS — O3440 Maternal care for other abnormalities of cervix, unspecified trimester: Secondary | ICD-10-CM

## 2013-06-11 LAB — POCT URINALYSIS DIP (DEVICE)
Bilirubin Urine: NEGATIVE
Glucose, UA: NEGATIVE mg/dL
Ketones, ur: NEGATIVE mg/dL
Leukocytes, UA: NEGATIVE
Nitrite: NEGATIVE
Protein, ur: 30 mg/dL — AB
Specific Gravity, Urine: 1.025 (ref 1.005–1.030)
Urobilinogen, UA: 0.2 mg/dL (ref 0.0–1.0)
pH: 6.5 (ref 5.0–8.0)

## 2013-06-11 MED ORDER — ZOLPIDEM TARTRATE 10 MG PO TABS: 10.0000 mg | ORAL_TABLET | Freq: Every evening | ORAL | Status: DC | PRN

## 2013-06-11 NOTE — Progress Notes (Signed)
P= 113 Pt. States she was unable to pick up her Diflucan and still has a yeast infection, needs it re-prescribed as [facility] stated it had been there too long and they would not fill it.  Needs refill of Ambien.

## 2013-06-11 NOTE — Progress Notes (Signed)
Pt with no complaints.  Plans to breast feed Needs refill on Ambien Plans BTL for contraception- reports that [employer] papers have already been signed

## 2013-06-11 NOTE — Patient Instructions (Signed)

## 2013-06-18 ENCOUNTER — Encounter (HOSPITAL_COMMUNITY): Payer: Self-pay

## 2013-06-18 ENCOUNTER — Ambulatory Visit (INDEPENDENT_AMBULATORY_CARE_PROVIDER_SITE_OTHER): Payer: Medicaid Other | Admitting: Family Medicine

## 2013-06-18 ENCOUNTER — Inpatient Hospital Stay (HOSPITAL_COMMUNITY)
Admission: AD | Admit: 2013-06-18 | Discharge: 2013-06-18 | Disposition: A | Discharge status: Home / Self Care | Payer: Medicaid Other | Source: Ambulatory Visit | Attending: Obstetrics & Gynecology | Admitting: Obstetrics & Gynecology

## 2013-06-18 VITALS — BP 114/71 | Temp 98.7°F | Wt 194.8 lb

## 2013-06-18 DIAGNOSIS — O99891 Other specified diseases and conditions complicating pregnancy: Secondary | ICD-10-CM

## 2013-06-18 DIAGNOSIS — Z3480 Encounter for supervision of other normal pregnancy, unspecified trimester: Secondary | ICD-10-CM

## 2013-06-18 DIAGNOSIS — O479 False labor, unspecified: Secondary | ICD-10-CM | POA: Insufficient documentation

## 2013-06-18 DIAGNOSIS — Z3483 Encounter for supervision of other normal pregnancy, third trimester: Secondary | ICD-10-CM

## 2013-06-18 LAB — POCT URINALYSIS DIP (DEVICE)
Bilirubin Urine: NEGATIVE
Glucose, UA: NEGATIVE mg/dL
Hgb urine dipstick: NEGATIVE
Ketones, ur: NEGATIVE mg/dL
Nitrite: NEGATIVE
Protein, ur: NEGATIVE mg/dL
Specific Gravity, Urine: 1.025 (ref 1.005–1.030)
Urobilinogen, UA: 1 mg/dL (ref 0.0–1.0)
pH: 7.5 (ref 5.0–8.0)

## 2013-06-18 MED ORDER — PRENATAL MULTIVITAMIN CH: 1.0000 | ORAL_TABLET | Freq: Every day | ORAL | Status: DC

## 2013-06-18 MED ORDER — OXYCODONE-ACETAMINOPHEN 5-325 MG PO TABS
2.0000 | ORAL_TABLET | Freq: Once | ORAL | Status: AC
  Administered 2013-06-18: 2 via ORAL
  Filled 2013-06-18: qty 2

## 2013-06-18 NOTE — Progress Notes (Signed)
+  FM, +CTx irregular, No Lof, No VB Stripped membranes  Stacy [name] is a 27 y.o. [id number] at [redacted]w[redacted]d here for ROB visit.    Discussed with Patient:  - Plans to breast/bottle feed.  All questions answered. - Continue prenatal vitamins. - Reviewed fetal kick counts Pt to perform daily at a time when the baby is active, lie laterally with both hands on belly in quiet room and count all movements (hiccups, shoulder rolls, obvious kicks, etc); pt is to report to clinic MAU for less than 10 movements felt in a 2 hour time period-pt told as soon as she counts 10 movements the count is complete.  - Routine precautions discussed (depression, infection s/s).   Patient provided with all pertinent phone numbers for emergencies. - RTC for any VB, regular, painful cramps/ctxs occurring at a rate of >2/10 min, fever (100.5 or higher), n/v/d, any pain that is unresolving or worsening, LOF, decreased fetal movement, CP, SOB, edema -RTC in one week for next visit.  Problems: Patient Active Problem List   Diagnosis Date Noted  . Short cervix affecting pregnancy 03/14/2013  . Other current maternal conditions classifiable elsewhere, antepartum 01/31/2013  . Supervision of other normal pregnancy 01/14/2013  . Insomnia 01/02/2013  . History of pilonidal cyst 03/17/2011    To Do: 1.  Membranes stirpped  [ ]  Vaccines: Flu:rec  Tdap: rec [ ]  BCM: BTL [ ]  Readiness: baby has a place to sleep, car seat, other baby necessities.  Edu: [ x]TL precautions

## 2013-06-18 NOTE — Patient Instructions (Signed)
Third Trimester of Pregnancy  The third trimester is from week 29 through week 42, months 7 through 9. The third trimester is a time when the fetus is growing rapidly. At the end of the ninth month, the fetus is about 20 inches in length and weighs 6 10 pounds.   BODY CHANGES  Your body goes through many changes during pregnancy. The changes vary from woman to woman.    Your weight will continue to increase. You can expect to gain 25 35 pounds (11 16 kg) by the end of the pregnancy.   You may begin to get stretch marks on your hips, abdomen, and breasts.   You may urinate more often because the fetus is moving lower into your pelvis and pressing on your bladder.   You may develop or continue to have heartburn as a result of your pregnancy.   You may develop constipation because certain hormones are causing the muscles that push waste through your intestines to slow down.   You may develop hemorrhoids or swollen, bulging veins (varicose veins).   You may have pelvic pain because of the weight gain and pregnancy hormones relaxing your joints between the bones in your pelvis. Back aches may result from over exertion of the muscles supporting your posture.   Your breasts will continue to grow and be tender. A yellow discharge may leak from your breasts called colostrum.   Your belly button may stick out.   You may feel short of breath because of your expanding uterus.   You may notice the fetus "dropping," or moving lower in your abdomen.   You may have a bloody mucus discharge. This usually occurs a few days to a week before labor begins.   Your cervix becomes thin and soft (effaced) near your due date.  WHAT TO EXPECT AT YOUR PRENATAL EXAMS   You will have prenatal exams every 2 weeks until week 36. Then, you will have weekly prenatal exams. During a routine prenatal visit:   You will be weighed to make sure you and the fetus are growing normally.   Your blood pressure is taken.   Your abdomen will be  measured to track your baby's growth.   The fetal heartbeat will be listened to.   Any test results from the previous visit will be discussed.   You may have a cervical check near your due date to see if you have effaced.  At around 36 weeks, your caregiver will check your cervix. At the same time, your caregiver will also perform a test on the secretions of the vaginal tissue. This test is to determine if a type of bacteria, Group B streptococcus, is present. Your caregiver will explain this further.  Your caregiver may ask you:   What your birth plan is.   How you are feeling.   If you are feeling the baby move.   If you have had any abnormal symptoms, such as leaking fluid, bleeding, severe headaches, or abdominal cramping.   If you have any questions.  Other tests or screenings that may be performed during your third trimester include:   Blood tests that check for low iron levels (anemia).   Fetal testing to check the health, activity level, and growth of the fetus. Testing is done if you have certain medical conditions or if there are problems during the pregnancy.  FALSE LABOR  You may feel small, irregular contractions that eventually go away. These are called Braxton Hicks contractions, or   false labor. Contractions may last for hours, days, or even weeks before true labor sets in. If contractions come at regular intervals, intensify, or become painful, it is best to be seen by your caregiver.   SIGNS OF LABOR    Menstrual-like cramps.   Contractions that are 5 minutes apart or less.   Contractions that start on the top of the uterus and spread down to the lower abdomen and back.   A sense of increased pelvic pressure or back pain.   A watery or bloody mucus discharge that comes from the vagina.  If you have any of these signs before the 37th week of pregnancy, call your caregiver right away. You need to go to the hospital to get checked immediately.  HOME CARE INSTRUCTIONS    Avoid all  smoking, herbs, alcohol, and unprescribed drugs. These chemicals affect the formation and growth of the baby.   Follow your caregiver's instructions regarding medicine use. There are medicines that are either safe or unsafe to take during pregnancy.   Exercise only as directed by your caregiver. Experiencing uterine cramps is a good sign to stop exercising.   Continue to eat regular, healthy meals.   Wear a good support bra for breast tenderness.   Do not use hot tubs, steam rooms, or saunas.   Wear your seat belt at all times when driving.   Avoid raw meat, uncooked cheese, cat litter boxes, and soil used by cats. These carry germs that can cause birth defects in the baby.   Take your prenatal vitamins.   Try taking a stool softener (if your caregiver approves) if you develop constipation. Eat more high-fiber foods, such as fresh vegetables or fruit and whole grains. Drink plenty of fluids to keep your urine clear or pale yellow.   Take warm sitz baths to soothe any pain or discomfort caused by hemorrhoids. Use hemorrhoid cream if your caregiver approves.   If you develop varicose veins, wear support hose. Elevate your feet for 15 minutes, 3 4 times a day. Limit salt in your diet.   Avoid heavy lifting, wear low heal shoes, and practice good posture.   Rest a lot with your legs elevated if you have leg cramps or low back pain.   Visit your dentist if you have not gone during your pregnancy. Use a soft toothbrush to brush your teeth and be gentle when you floss.   A sexual relationship may be continued unless your caregiver directs you otherwise.   Do not travel far distances unless it is absolutely necessary and only with the approval of your caregiver.   Take prenatal classes to understand, practice, and ask questions about the labor and delivery.   Make a trial run to the hospital.   Pack your hospital bag.   Prepare the baby's nursery.   Continue to go to all your prenatal visits as directed  by your caregiver.  SEEK MEDICAL CARE IF:   You are unsure if you are in labor or if your water has broken.   You have dizziness.   You have mild pelvic cramps, pelvic pressure, or nagging pain in your abdominal area.   You have persistent nausea, vomiting, or diarrhea.   You have a bad smelling vaginal discharge.   You have pain with urination.  SEEK IMMEDIATE MEDICAL CARE IF:    You have a fever.   You are leaking fluid from your vagina.   You have spotting or bleeding from your vagina.     You have severe abdominal cramping or pain.   You have rapid weight loss or gain.   You have shortness of breath with chest pain.   You notice sudden or extreme swelling of your face, hands, ankles, feet, or legs.   You have not felt your baby move in over an hour.   You have severe headaches that do not go away with medicine.   You have vision changes.  Document Released: 06/28/2001 Document Revised: 03/06/2013 Document Reviewed: 09/04/2012  ExitCare Patient Information 2014 [employer].

## 2013-06-18 NOTE — Progress Notes (Signed)
P=85  Contractions with irregular pattern for 2 days.

## 2013-06-18 NOTE — MAU Note (Signed)
contractions 

## 2013-06-24 ENCOUNTER — Encounter (HOSPITAL_COMMUNITY): Payer: Self-pay | Admitting: General Practice

## 2013-06-24 ENCOUNTER — Telehealth: Payer: Self-pay | Admitting: *Deleted

## 2013-06-24 ENCOUNTER — Inpatient Hospital Stay (HOSPITAL_COMMUNITY)
Admission: AD | Admit: 2013-06-24 | Discharge: 2013-06-24 | Disposition: A | Discharge status: Home / Self Care | Payer: Medicaid Other | Source: Ambulatory Visit | Attending: Obstetrics & Gynecology | Admitting: Obstetrics & Gynecology

## 2013-06-24 DIAGNOSIS — O99891 Other specified diseases and conditions complicating pregnancy: Secondary | ICD-10-CM | POA: Insufficient documentation

## 2013-06-24 DIAGNOSIS — O26893 Other specified pregnancy related conditions, third trimester: Secondary | ICD-10-CM

## 2013-06-24 DIAGNOSIS — R0602 Shortness of breath: Secondary | ICD-10-CM | POA: Insufficient documentation

## 2013-06-24 DIAGNOSIS — O9989 Other specified diseases and conditions complicating pregnancy, childbirth and the puerperium: Secondary | ICD-10-CM

## 2013-06-24 DIAGNOSIS — M549 Dorsalgia, unspecified: Secondary | ICD-10-CM | POA: Insufficient documentation

## 2013-06-24 MED ORDER — OXYCODONE-ACETAMINOPHEN 5-325 MG PO TABS: 2.0000 | ORAL_TABLET | ORAL | Status: DC | PRN

## 2013-06-24 NOTE — Telephone Encounter (Signed)
Pt called nurse line again requesting call back.

## 2013-06-24 NOTE — MAU Provider Note (Signed)
History     CSN: [id number]  Arrival date and time: 06/24/13 1728   None     Chief Complaint  Patient presents with  . Labor Eval  . Shortness of Breath   HPI Stacy [name] is a 27 y.o. [id number] at [redacted]w[redacted]d presents for evaluation of SOB when laying flat. Pt states that she has had SOB related to asthma this pregnancy but albuterol is not helping her SOB. Pt has also been complaining of abdominal contractions that are not frequent or regular. Pt states that she is having back pain also.  Denies LOF, VB, +Ctx, Normal fetal movement  +SOB, no coughing, wheezing, n/v, d/c, weakness, headaches, or other complaints.  OB History   Grav Para Term Preterm Abortions TAB SAB Ect Mult Living   4 2 2  1 1    2       Past Medical History  Diagnosis Date  . UTI (urinary tract infection)   . Medical history non-contributory   . Asthma     Past Surgical History  Procedure Laterality Date  . Cystectomy    . Wisdom tooth extraction    . Pilonidal cyst excision      Family History  Problem Relation Age of Onset  . Alcohol abuse Neg Hx   . Arthritis Neg Hx   . Asthma Neg Hx   . Birth defects Neg Hx   . Cancer Neg Hx   . COPD Neg Hx   . Depression Neg Hx   . Diabetes Neg Hx   . Drug abuse Neg Hx   . Early death Neg Hx   . Hearing loss Neg Hx   . Heart disease Neg Hx   . Hyperlipidemia Neg Hx   . Hypertension Neg Hx   . Kidney disease Neg Hx   . Learning disabilities Neg Hx   . Mental illness Neg Hx   . Mental retardation Neg Hx   . Miscarriages / Stillbirths Neg Hx   . Stroke Neg Hx   . Vision loss Neg Hx     History  Substance Use Topics  . Smoking status: Former Smoker -- 0.25 packs/day    Types: Cigarettes  . Smokeless tobacco: Never Used     Comment: 4 CIGS A DAY- Stopped smoking when she had positive upt  . Alcohol Use: No    Allergies: No Known Allergies  Prescriptions prior to admission  Medication Sig Dispense Refill  . albuterol (PROVENTIL  HFA;VENTOLIN HFA) 108 (90 BASE) MCG/ACT inhaler Inhale 2 puffs into the lungs every 6 (six) hours as needed for wheezing.  1 Inhaler  2  . pantoprazole (PROTONIX) 40 MG tablet Take 1 tablet (40 mg total) by mouth daily.  30 tablet  1  . Prenatal Vit-Fe Fumarate-FA (PRENATAL MULTIVITAMIN) TABS tablet Take 1 tablet by mouth daily at 12 noon.  90 tablet  2  . zolpidem (AMBIEN) 10 MG tablet Take 1 tablet (10 mg total) by mouth at bedtime as needed for sleep.  30 tablet  0    ROS as above Physical Exam   Blood pressure 125/70, pulse 79, temperature 98.5 F (36.9 C), temperature source Oral, resp. rate 18, last menstrual period 09/15/2012, SpO2 100.00%.  Physical Exam VSS NAD CTAB no wrc RRR no mgt Gravid NTTP ND No c/c/e Dilation: 3 Effacement (%): 40 Cervical Position: Posterior Station: -3 Presentation: Vertex Exam by:: Dr. Ike Bene Unchanged from prior, restripped membranes this visit MAU Course  Procedures  MDM No labs. Not  in labor.  N  Assessment and Plan  Stacy [name] is a 27 y.o. [id number] at [redacted]w[redacted]d with SOB of pregnancy. No swollen extremities (low concern for DVT or cardiac/renal/hepatic failure), Normal heart rate and satting 100% on RA (low concern for PE or asthma exacerbation). SOB most consistent SOB of pregnancy. Not in labor. Follow up on Wednesday as scheduled. Will give 6T of percocet to help pt sleep with pain.  Jolyn Lent RYAN 06/24/2013, 7:08 PM

## 2013-06-24 NOTE — Telephone Encounter (Signed)
Pt called nurse line requesting call back to discuss mucous discharge for 3 days.

## 2013-06-24 NOTE — MAU Note (Signed)
Patient states she is having contractions every 4-6 minutes with mucus discharge. States less than usual fetal movement today. States she started having shortness of breath last night and has used her inhaler without relief. Denies leaking or bleeding.

## 2013-06-24 NOTE — Telephone Encounter (Signed)
Called Stacy [name] and she reports Dr. Ike Bene stripped her membranes last week and she has been having mucous discharge since then yellowish in color.  We discussed this could be her mucous plug but does not tell us when she will go into labor.  Also reports having hard time breathing and her inhaler is not helping at all. I advised her to come to MAU for evaluation today. She agrees to plan and will come to MAU for evaluation.

## 2013-06-26 ENCOUNTER — Ambulatory Visit (INDEPENDENT_AMBULATORY_CARE_PROVIDER_SITE_OTHER): Payer: Medicaid Other | Admitting: Obstetrics and Gynecology

## 2013-06-26 VITALS — BP 119/70 | Wt 195.1 lb

## 2013-06-26 DIAGNOSIS — Z3483 Encounter for supervision of other normal pregnancy, third trimester: Secondary | ICD-10-CM

## 2013-06-26 DIAGNOSIS — O26879 Cervical shortening, unspecified trimester: Secondary | ICD-10-CM

## 2013-06-26 LAB — POCT URINALYSIS DIP (DEVICE)
Bilirubin Urine: NEGATIVE
Glucose, UA: NEGATIVE mg/dL
Hgb urine dipstick: NEGATIVE
Ketones, ur: NEGATIVE mg/dL
Nitrite: NEGATIVE
Protein, ur: 30 mg/dL — AB
Specific Gravity, Urine: 1.025 (ref 1.005–1.030)
Urobilinogen, UA: 1 mg/dL (ref 0.0–1.0)
pH: 6.5 (ref 5.0–8.0)

## 2013-06-26 MED ORDER — ZOLPIDEM TARTRATE 10 MG PO TABS: 10.0000 mg | ORAL_TABLET | Freq: Every evening | ORAL | Status: DC | PRN

## 2013-06-26 MED ORDER — ALBUTEROL SULFATE HFA 108 (90 BASE) MCG/ACT IN AERS: 2.0000 | INHALATION_SPRAY | Freq: Four times a day (QID) | RESPIRATORY_TRACT | Status: DC | PRN

## 2013-06-26 NOTE — Progress Notes (Signed)
Pulse: 84 Patient out of ambien, would like refill.

## 2013-06-26 NOTE — Progress Notes (Signed)
Plans reviewed. TL consent done. Since cx dilated and hx fast labors consider IOL after 40 and prior to 41wks.  Ambien refill.

## 2013-06-26 NOTE — Patient Instructions (Signed)
Third Trimester of Pregnancy  The third trimester is from week 29 through week 42, months 7 through 9. The third trimester is a time when the fetus is growing rapidly. At the end of the ninth month, the fetus is about 20 inches in length and weighs 6 10 pounds.   BODY CHANGES  Your body goes through many changes during pregnancy. The changes vary from woman to woman.    Your weight will continue to increase. You can expect to gain 25 35 pounds (11 16 kg) by the end of the pregnancy.   You may begin to get stretch marks on your hips, abdomen, and breasts.   You may urinate more often because the fetus is moving lower into your pelvis and pressing on your bladder.   You may develop or continue to have heartburn as a result of your pregnancy.   You may develop constipation because certain hormones are causing the muscles that push waste through your intestines to slow down.   You may develop hemorrhoids or swollen, bulging veins (varicose veins).   You may have pelvic pain because of the weight gain and pregnancy hormones relaxing your joints between the bones in your pelvis. Back aches may result from over exertion of the muscles supporting your posture.   Your breasts will continue to grow and be tender. A yellow discharge may leak from your breasts called colostrum.   Your belly button may stick out.   You may feel short of breath because of your expanding uterus.   You may notice the fetus "dropping," or moving lower in your abdomen.   You may have a bloody mucus discharge. This usually occurs a few days to a week before labor begins.   Your cervix becomes thin and soft (effaced) near your due date.  WHAT TO EXPECT AT YOUR PRENATAL EXAMS   You will have prenatal exams every 2 weeks until week 36. Then, you will have weekly prenatal exams. During a routine prenatal visit:   You will be weighed to make sure you and the fetus are growing normally.   Your blood pressure is taken.   Your abdomen will be  measured to track your baby's growth.   The fetal heartbeat will be listened to.   Any test results from the previous visit will be discussed.   You may have a cervical check near your due date to see if you have effaced.  At around 36 weeks, your caregiver will check your cervix. At the same time, your caregiver will also perform a test on the secretions of the vaginal tissue. This test is to determine if a type of bacteria, Group B streptococcus, is present. Your caregiver will explain this further.  Your caregiver may ask you:   What your birth plan is.   How you are feeling.   If you are feeling the baby move.   If you have had any abnormal symptoms, such as leaking fluid, bleeding, severe headaches, or abdominal cramping.   If you have any questions.  Other tests or screenings that may be performed during your third trimester include:   Blood tests that check for low iron levels (anemia).   Fetal testing to check the health, activity level, and growth of the fetus. Testing is done if you have certain medical conditions or if there are problems during the pregnancy.  FALSE LABOR  You may feel small, irregular contractions that eventually go away. These are called Braxton Hicks contractions, or   false labor. Contractions may last for hours, days, or even weeks before true labor sets in. If contractions come at regular intervals, intensify, or become painful, it is best to be seen by your caregiver.   SIGNS OF LABOR    Menstrual-like cramps.   Contractions that are 5 minutes apart or less.   Contractions that start on the top of the uterus and spread down to the lower abdomen and back.   A sense of increased pelvic pressure or back pain.   A watery or bloody mucus discharge that comes from the vagina.  If you have any of these signs before the 37th week of pregnancy, call your caregiver right away. You need to go to the hospital to get checked immediately.  HOME CARE INSTRUCTIONS    Avoid all  smoking, herbs, alcohol, and unprescribed drugs. These chemicals affect the formation and growth of the baby.   Follow your caregiver's instructions regarding medicine use. There are medicines that are either safe or unsafe to take during pregnancy.   Exercise only as directed by your caregiver. Experiencing uterine cramps is a good sign to stop exercising.   Continue to eat regular, healthy meals.   Wear a good support bra for breast tenderness.   Do not use hot tubs, steam rooms, or saunas.   Wear your seat belt at all times when driving.   Avoid raw meat, uncooked cheese, cat litter boxes, and soil used by cats. These carry germs that can cause birth defects in the baby.   Take your prenatal vitamins.   Try taking a stool softener (if your caregiver approves) if you develop constipation. Eat more high-fiber foods, such as fresh vegetables or fruit and whole grains. Drink plenty of fluids to keep your urine clear or pale yellow.   Take warm sitz baths to soothe any pain or discomfort caused by hemorrhoids. Use hemorrhoid cream if your caregiver approves.   If you develop varicose veins, wear support hose. Elevate your feet for 15 minutes, 3 4 times a day. Limit salt in your diet.   Avoid heavy lifting, wear low heal shoes, and practice good posture.   Rest a lot with your legs elevated if you have leg cramps or low back pain.   Visit your dentist if you have not gone during your pregnancy. Use a soft toothbrush to brush your teeth and be gentle when you floss.   A sexual relationship may be continued unless your caregiver directs you otherwise.   Do not travel far distances unless it is absolutely necessary and only with the approval of your caregiver.   Take prenatal classes to understand, practice, and ask questions about the labor and delivery.   Make a trial run to the hospital.   Pack your hospital bag.   Prepare the baby's nursery.   Continue to go to all your prenatal visits as directed  by your caregiver.  SEEK MEDICAL CARE IF:   You are unsure if you are in labor or if your water has broken.   You have dizziness.   You have mild pelvic cramps, pelvic pressure, or nagging pain in your abdominal area.   You have persistent nausea, vomiting, or diarrhea.   You have a bad smelling vaginal discharge.   You have pain with urination.  SEEK IMMEDIATE MEDICAL CARE IF:    You have a fever.   You are leaking fluid from your vagina.   You have spotting or bleeding from your vagina.     You have severe abdominal cramping or pain.   You have rapid weight loss or gain.   You have shortness of breath with chest pain.   You notice sudden or extreme swelling of your face, hands, ankles, feet, or legs.   You have not felt your baby move in over an hour.   You have severe headaches that do not go away with medicine.   You have vision changes.  Document Released: 06/28/2001 Document Revised: 03/06/2013 Document Reviewed: 09/04/2012  ExitCare Patient Information 2014 [employer].

## 2013-06-30 ENCOUNTER — Encounter (HOSPITAL_COMMUNITY): Payer: Self-pay | Admitting: *Deleted

## 2013-06-30 ENCOUNTER — Inpatient Hospital Stay (HOSPITAL_COMMUNITY): Payer: Medicaid Other | Admitting: Anesthesiology

## 2013-06-30 ENCOUNTER — Inpatient Hospital Stay (HOSPITAL_COMMUNITY)
Admission: AD | Admit: 2013-06-30 | Discharge: 2013-07-02 | DRG: 767 | Disposition: A | Discharge status: Home / Self Care | Payer: Medicaid Other | Source: Ambulatory Visit | Attending: Obstetrics and Gynecology | Admitting: Obstetrics and Gynecology

## 2013-06-30 ENCOUNTER — Encounter (HOSPITAL_COMMUNITY): Payer: Medicaid Other | Admitting: Anesthesiology

## 2013-06-30 DIAGNOSIS — O368190 Decreased fetal movements, unspecified trimester, not applicable or unspecified: Secondary | ICD-10-CM | POA: Diagnosis present

## 2013-06-30 DIAGNOSIS — O99891 Other specified diseases and conditions complicating pregnancy: Secondary | ICD-10-CM

## 2013-06-30 DIAGNOSIS — R030 Elevated blood-pressure reading, without diagnosis of hypertension: Secondary | ICD-10-CM | POA: Diagnosis present

## 2013-06-30 DIAGNOSIS — IMO0001 Reserved for inherently not codable concepts without codable children: Secondary | ICD-10-CM

## 2013-06-30 DIAGNOSIS — O99892 Other specified diseases and conditions complicating childbirth: Secondary | ICD-10-CM | POA: Diagnosis present

## 2013-06-30 DIAGNOSIS — Z3483 Encounter for supervision of other normal pregnancy, third trimester: Secondary | ICD-10-CM

## 2013-06-30 DIAGNOSIS — Z302 Encounter for sterilization: Secondary | ICD-10-CM

## 2013-06-30 LAB — CBC
HCT: 34.6 % — ABNORMAL LOW (ref 36.0–46.0)
Hemoglobin: 11.9 g/dL — ABNORMAL LOW (ref 12.0–15.0)
MCH: 29.8 pg (ref 26.0–34.0)
MCHC: 34.4 g/dL (ref 30.0–36.0)
MCV: 86.7 fL (ref 78.0–100.0)
Platelets: 244 10*3/uL (ref 150–400)
RBC: 3.99 MIL/uL (ref 3.87–5.11)
RDW: 13.8 % (ref 11.5–15.5)
WBC: 12.7 10*3/uL — ABNORMAL HIGH (ref 4.0–10.5)

## 2013-06-30 LAB — RPR: RPR Ser Ql: NONREACTIVE

## 2013-06-30 MED ORDER — OXYCODONE-ACETAMINOPHEN 5-325 MG PO TABS: 1.0000 | ORAL_TABLET | ORAL | Status: DC | PRN

## 2013-06-30 MED ORDER — LACTATED RINGERS IV SOLN
INTRAVENOUS | Status: DC
  Administered 2013-06-30: 19:00:00 via INTRAVENOUS

## 2013-06-30 MED ORDER — OXYTOCIN BOLUS FROM INFUSION
500.0000 mL | INTRAVENOUS | Status: DC
  Administered 2013-07-01: 500 mL via INTRAVENOUS

## 2013-06-30 MED ORDER — FENTANYL 2.5 MCG/ML BUPIVACAINE 1/10 % EPIDURAL INFUSION (WH - ANES)
INTRAMUSCULAR | Status: AC
  Administered 2013-06-30: 14 mL/h via EPIDURAL
  Filled 2013-06-30: qty 125

## 2013-06-30 MED ORDER — EPHEDRINE 5 MG/ML INJ
10.0000 mg | INTRAVENOUS | Status: DC | PRN
  Filled 2013-06-30: qty 2

## 2013-06-30 MED ORDER — IBUPROFEN 600 MG PO TABS
600.0000 mg | ORAL_TABLET | Freq: Four times a day (QID) | ORAL | Status: DC | PRN
  Administered 2013-07-01: 600 mg via ORAL
  Filled 2013-06-30: qty 1

## 2013-06-30 MED ORDER — LACTATED RINGERS IV SOLN: 500.0000 mL | Freq: Once | INTRAVENOUS | Status: DC

## 2013-06-30 MED ORDER — PHENYLEPHRINE 40 MCG/ML (10ML) SYRINGE FOR IV PUSH (FOR BLOOD PRESSURE SUPPORT)
80.0000 ug | PREFILLED_SYRINGE | INTRAVENOUS | Status: DC | PRN
  Filled 2013-06-30: qty 2

## 2013-06-30 MED ORDER — FENTANYL 2.5 MCG/ML BUPIVACAINE 1/10 % EPIDURAL INFUSION (WH - ANES)
14.0000 mL/h | INTRAMUSCULAR | Status: DC | PRN
  Administered 2013-06-30: 14 mL/h via EPIDURAL
  Filled 2013-06-30: qty 125

## 2013-06-30 MED ORDER — CITRIC ACID-SODIUM CITRATE 334-500 MG/5ML PO SOLN: 30.0000 mL | ORAL | Status: DC | PRN

## 2013-06-30 MED ORDER — ACETAMINOPHEN 325 MG PO TABS
650.0000 mg | ORAL_TABLET | ORAL | Status: DC | PRN
  Administered 2013-06-30: 650 mg via ORAL
  Filled 2013-06-30: qty 2

## 2013-06-30 MED ORDER — DIPHENHYDRAMINE HCL 50 MG/ML IJ SOLN: 12.5000 mg | INTRAMUSCULAR | Status: DC | PRN

## 2013-06-30 MED ORDER — PROMETHAZINE HCL 25 MG/ML IJ SOLN
12.5000 mg | Freq: Four times a day (QID) | INTRAMUSCULAR | Status: DC | PRN
  Administered 2013-06-30: 23:00:00 via INTRAVENOUS
  Filled 2013-06-30: qty 1

## 2013-06-30 MED ORDER — OXYTOCIN 40 UNITS IN LACTATED RINGERS INFUSION - SIMPLE MED
62.5000 mL/h | INTRAVENOUS | Status: DC
  Filled 2013-06-30: qty 1000

## 2013-06-30 MED ORDER — SODIUM BICARBONATE 8.4 % IV SOLN
INTRAVENOUS | Status: DC | PRN
  Administered 2013-06-30: 5 mL via EPIDURAL

## 2013-06-30 MED ORDER — PHENYLEPHRINE 40 MCG/ML (10ML) SYRINGE FOR IV PUSH (FOR BLOOD PRESSURE SUPPORT)
PREFILLED_SYRINGE | INTRAVENOUS | Status: AC
  Filled 2013-06-30: qty 5

## 2013-06-30 MED ORDER — ONDANSETRON HCL 4 MG/2ML IJ SOLN
4.0000 mg | Freq: Four times a day (QID) | INTRAMUSCULAR | Status: DC | PRN
  Administered 2013-06-30: 4 mg via INTRAVENOUS
  Filled 2013-06-30: qty 2

## 2013-06-30 MED ORDER — EPHEDRINE 5 MG/ML INJ
INTRAVENOUS | Status: AC
  Filled 2013-06-30: qty 4

## 2013-06-30 MED ORDER — LIDOCAINE HCL (PF) 1 % IJ SOLN
30.0000 mL | INTRAMUSCULAR | Status: DC | PRN
  Filled 2013-06-30 (×2): qty 30

## 2013-06-30 MED ORDER — LACTATED RINGERS IV SOLN: 500.0000 mL | INTRAVENOUS | Status: DC | PRN

## 2013-06-30 NOTE — Anesthesia Preprocedure Evaluation (Signed)
Anesthesia Evaluation  Patient identified by MRN, date of birth, ID band Patient awake    Reviewed: Allergy & Precautions, H&P , Patient's Chart, lab work & pertinent test results  Airway Mallampati: II TM Distance: >3 FB Neck ROM: full    Dental  (+) Teeth Intact   Pulmonary asthma , former smoker,  breath sounds clear to auscultation        Cardiovascular Rhythm:regular Rate:Normal     Neuro/Psych    GI/Hepatic   Endo/Other    Renal/GU      Musculoskeletal   Abdominal   Peds  Hematology   Anesthesia Other Findings       Reproductive/Obstetrics (+) Pregnancy                           Anesthesia Physical Anesthesia Plan  ASA: II  Anesthesia Plan: Epidural   Post-op Pain Management:    Induction:   Airway Management Planned:   Additional Equipment:   Intra-op Plan:   Post-operative Plan:   Informed Consent: I have reviewed the patients History and Physical, chart, labs and discussed the procedure including the risks, benefits and alternatives for the proposed anesthesia with the patient or authorized representative who has indicated his/her understanding and acceptance.   Dental Advisory Given  Plan Discussed with:   Anesthesia Plan Comments: (Labs checked- platelets confirmed with RN in room. Fetal heart tracing, per RN, reported to be stable enough for sitting procedure. Discussed epidural, and patient consents to the procedure:  included risk of possible headache,backache, failed block, allergic reaction, and nerve injury. This patient was asked if she had any questions or concerns before the procedure started.)        Anesthesia Quick Evaluation  

## 2013-06-30 NOTE — Progress Notes (Signed)
Stacy [name] is a 27 y.o. [id number] at [redacted]w[redacted]d admitted for latent labor and less than reassuring NST  Subjective: Comfortable after epidural. No complaints at this time.  Objective: BP 153/100  Pulse 102  Temp(Src) 98 F (36.7 C) (Oral)  Resp 18  Ht 5\' 5"  (1.651 m)  Wt 87.544 kg (193 lb)  BMI 32.12 kg/m2  SpO2 100%  LMP 09/15/2012    Filed Vitals:   06/30/13 1549 06/30/13 1631 06/30/13 1634 06/30/13 1637  BP:  139/81 131/92 153/100  Pulse:  83 82 102  Temp:      TempSrc:      Resp:  18 18 18   Height: 5\' 5"  (1.651 m)     Weight: 87.544 kg (193 lb)     SpO2:  100%         FHT:  FHR: 140s bpm, variability: moderate,  accelerations:  Present,  decelerations:  Absent UC:   regular, every 4 minutes SVE:   Dilation: 4.5 (bbow) Effacement (%): 60 Station: -1 Exam by:: C. Millican, RN/G. Morris, RN   Labs: Lab Results  Component Value Date   WBC 12.7* 06/30/2013   HGB 11.9* 06/30/2013   HCT 34.6* 06/30/2013   MCV 86.7 06/30/2013   PLT 244 06/30/2013    Assessment / Plan: Spontaneous labor, progressing normally  Labor: Progressing normally and if no change in 4 hrs will augment Preeclampsia:  elevated blood pressure. May be pain related. if not improved with epidural will send baseline labs. Fetal Wellbeing:  Category I Pain Control:  Epidural I/D:  n/a Anticipated MOD:  NSVD  ODOM, MICHAEL RYAN 06/30/2013, 4:44 PM

## 2013-06-30 NOTE — MAU Note (Signed)
Patient states she is having contractions every 5 minutes with mucus discharge. Denies leaking or bleeding. States she feels fetal movement, but less than usual.

## 2013-06-30 NOTE — Progress Notes (Signed)
Stacy [name] is a 27 y.o. [id number] at [redacted]w[redacted]d admitted for latent labor and less than reassuring NST  Subjective: Comfortable after epidural. No complaints at this time. Pt AROM'd at this check.  Objective: BP 101/59  Pulse 92  Temp(Src) 98.2 F (36.8 C) (Oral)  Resp 18  Ht 5\' 5"  (1.651 m)  Wt 87.544 kg (193 lb)  BMI 32.12 kg/m2  SpO2 100%  LMP 09/15/2012    Filed Vitals:   06/30/13 2030 06/30/13 2100 06/30/13 2130 06/30/13 2200  BP: 118/68 119/67 130/63 101/59  Pulse: 94 88 90 92  Temp:    98.2 F (36.8 C)  TempSrc:    Oral  Resp: 18 20  18   Height:      Weight:      SpO2:           FHT:  FHR: 130s bpm, variability: moderate,  accelerations:  Present,  decelerations:  Present variable decels with ctx improved with repositiion and AROM UC:   regular, every 4 minutes SVE:   Dilation: 5.5 Effacement (%): 70 Station: -2 Exam by:: dr Ike Bene  Labs: Lab Results  Component Value Date   WBC 12.7* 06/30/2013   HGB 11.9* 06/30/2013   HCT 34.6* 06/30/2013   MCV 86.7 06/30/2013   PLT 244 06/30/2013    Assessment / Plan: Spontaneous labor, progressing normally  Labor: Pt without significant chagne. S/P AROM. will monitor for 2 hrs and if not having adequate ctx with augment with pit. Preeclampsia:  BPs improved. No labs sent. No issue at this time. Fetal Wellbeing:  Category II Pain Control:  Epidural I/D:  n/a Anticipated MOD:  NSVD  ODOM, MICHAEL RYAN 06/30/2013, 10:31 PM

## 2013-06-30 NOTE — MAU Note (Signed)
27 yo, G4P2 at [redacted]w[redacted]d, presents to MAU with c/o contractions every 5 minutes since 0400 today. Reports decreased FM noted today. Denies VB, HSV, LOF. Prior vaginal delivery.

## 2013-06-30 NOTE — MAU Provider Note (Signed)
`````  Attestation of Attending Supervision of Advanced Practitioner: Evaluation and management procedures were performed by the PA/NP/CNM/OB Fellow under my supervision/collaboration. Chart reviewed and agree with management and plan.  FERGUSON,JOHN V 06/30/2013 9:04 PM

## 2013-06-30 NOTE — MAU Provider Note (Signed)
Stacy [name] is a 27 y.o. female [phone] with IUP at [redacted]w[redacted]d presenting for Painful contractions. Pt states she has been having irregular, every 2-15 minutes contractions for the last 2 days, associated with none vaginal bleeding.  Membranes are intact, with active fetal movement.   [facility] at [facility] since 11 wks  Prenatal History/Complications: Complicated with asthma that has been well controlled not frequently requiring albuterol.   Past Medical History: Past Medical History  Diagnosis Date  . UTI (urinary tract infection)   . Medical history non-contributory   . Asthma     Past Surgical History: Past Surgical History  Procedure Laterality Date  . Cystectomy    . Wisdom tooth extraction    . Pilonidal cyst excision      Obstetrical History: OB History   Grav Para Term Preterm Abortions TAB SAB Ect Mult Living   4 2 2  1 1    2       Gynecological History: OB History   Grav Para Term Preterm Abortions TAB SAB Ect Mult Living   4 2 2  1 1    2       Social History: History   Social History  . Marital Status: Single    Spouse Name: N/A    Number of Children: N/A  . Years of Education: N/A   Social History Main Topics  . Smoking status: Former Smoker -- 0.25 packs/day    Types: Cigarettes  . Smokeless tobacco: Never Used     Comment: 4 CIGS A DAY- Stopped smoking when she had positive upt  . Alcohol Use: No  . Drug Use: No  . Sexual Activity: Yes    Birth Control/ Protection: None   Other Topics Concern  . Not on file   Social History Narrative  . No narrative on file    Family History: Family History  Problem Relation Age of Onset  . Alcohol abuse Neg Hx   . Arthritis Neg Hx   . Asthma Neg Hx   . Birth defects Neg Hx   . Cancer Neg Hx   . COPD Neg Hx   . Depression Neg Hx   . Diabetes Neg Hx   . Drug abuse Neg Hx   . Early death Neg Hx   . Hearing loss Neg Hx   . Heart disease Neg Hx   . Hyperlipidemia Neg Hx   . Hypertension Neg Hx   .  Kidney disease Neg Hx   . Learning disabilities Neg Hx   . Mental illness Neg Hx   . Mental retardation Neg Hx   . Miscarriages / Stillbirths Neg Hx   . Stroke Neg Hx   . Vision loss Neg Hx     Allergies: No Known Allergies  Prescriptions prior to admission  Medication Sig Dispense Refill  . pantoprazole (PROTONIX) 40 MG tablet Take 1 tablet (40 mg total) by mouth daily.  30 tablet  1  . Prenatal Vit-Fe Fumarate-FA (PRENATAL MULTIVITAMIN) TABS tablet Take 1 tablet by mouth daily at 12 noon.  90 tablet  2  . zolpidem (AMBIEN) 10 MG tablet Take 1 tablet (10 mg total) by mouth at bedtime as needed for sleep.  15 tablet  0  . albuterol (PROVENTIL HFA;VENTOLIN HFA) 108 (90 BASE) MCG/ACT inhaler Inhale 2 puffs into the lungs every 6 (six) hours as needed for wheezing.  1 Inhaler  2     Review of Systems  All systems reviewed and negative except as stated  in HPI    Blood pressure 112/66, pulse 82, temperature 98 F (36.7 C), temperature source Oral, resp. rate 18, height 5\' 5"  (1.651 m), weight 87.726 kg (193 lb 6.4 oz), last menstrual period 09/15/2012. General appearance: alert, cooperative, appears stated age and no distress Lungs: clear to auscultation bilaterally Heart: regular rate and rhythm Abdomen: soft, non-tender; bowel sounds normal Pelvic: adequate Extremities: Homans sign is negative, no sign of DVT DTR's 1+ Presentation: cephalic Fetal monitoringBaseline: 140s bpm, Variability: Good {> 6 bpm), Accelerations: Reactive and Decelerations: Occurs rare Variable decels Uterine activityFrequency: Every 3-5 minutes Dilation: 4.5 (bbow) Effacement (%): 60 Station: -1 Exam by:: C. Millican, RN/G. Morris, RN    Prenatal labs: ABO, Rh: O/POS/-- (05/21 1126) Antibody: NEG (05/21 1126) Rubella:   RPR: NON REAC (09/25 1213)  HBsAg: NEGATIVE (05/21 1126)  HIV: NON REACTIVE (05/21 1126)  GBS:      Genetic Screen normal  Anatomic [place]  Normal  Tdap 04/11/13  Flu 04/25/13   Glucose Screen  104  GBS  negative  Feeding Preference  Breast  Contraception  BTS consent signed 05/09/13  Circumcision  Yes  [facility]  -  Dr. Eddie Candle  Prenatal Transfer Tool  Maternal Diabetes: No Genetic Screening: Normal Maternal Ultrasounds/Referrals: Normal Fetal Ultrasounds or other Referrals:  None Maternal Substance Abuse:  No Significant Maternal Medications:  Meds include: Other:  Albuterol Significant Maternal Lab Results: Lab values include: Group B Strep negative     Assessment:  MERARY GARGUILO is a 27 y.o. [id number] at [redacted]w[redacted]d here for painful ctx at term with decels on strip #Labor: Will observe for 4 hrs, if no change will augment. #Pain: Desires IV and epidural #FWB: Cat II #ID:  GBS neg #MOF: breast #MOC: BTL - consentt signed 10.23 #Circ:  Desires   ODOM, Audie Clear, MD 06/30/2013, 3:29 PM

## 2013-06-30 NOTE — Anesthesia Procedure Notes (Signed)

## 2013-07-01 ENCOUNTER — Encounter (HOSPITAL_COMMUNITY): Payer: Self-pay | Admitting: *Deleted

## 2013-07-01 ENCOUNTER — Inpatient Hospital Stay (HOSPITAL_COMMUNITY): Payer: Medicaid Other | Admitting: Anesthesiology

## 2013-07-01 ENCOUNTER — Encounter (HOSPITAL_COMMUNITY)
Admission: AD | Disposition: A | Discharge status: Home / Self Care | Payer: Self-pay | Source: Ambulatory Visit | Attending: Obstetrics and Gynecology

## 2013-07-01 ENCOUNTER — Encounter (HOSPITAL_COMMUNITY): Payer: Medicaid Other | Admitting: Anesthesiology

## 2013-07-01 DIAGNOSIS — O99892 Other specified diseases and conditions complicating childbirth: Secondary | ICD-10-CM

## 2013-07-01 DIAGNOSIS — Z302 Encounter for sterilization: Secondary | ICD-10-CM

## 2013-07-01 DIAGNOSIS — O368190 Decreased fetal movements, unspecified trimester, not applicable or unspecified: Secondary | ICD-10-CM

## 2013-07-01 DIAGNOSIS — O0940 Supervision of pregnancy with grand multiparity, unspecified trimester: Secondary | ICD-10-CM

## 2013-07-01 DIAGNOSIS — O9989 Other specified diseases and conditions complicating pregnancy, childbirth and the puerperium: Secondary | ICD-10-CM

## 2013-07-01 HISTORY — PX: TUBAL LIGATION: SHX77

## 2013-07-01 LAB — CBC
HCT: 30.1 % — ABNORMAL LOW (ref 36.0–46.0)
Hemoglobin: 10.2 g/dL — ABNORMAL LOW (ref 12.0–15.0)
MCH: 29.3 pg (ref 26.0–34.0)
MCHC: 33.9 g/dL (ref 30.0–36.0)
MCV: 86.5 fL (ref 78.0–100.0)
Platelets: 208 10*3/uL (ref 150–400)
RBC: 3.48 MIL/uL — ABNORMAL LOW (ref 3.87–5.11)
RDW: 13.9 % (ref 11.5–15.5)
WBC: 14.2 10*3/uL — ABNORMAL HIGH (ref 4.0–10.5)

## 2013-07-01 LAB — SURGICAL PCR SCREEN
MRSA, PCR: NEGATIVE
Staphylococcus aureus: NEGATIVE

## 2013-07-01 SURGERY — LIGATION, FALLOPIAN TUBE, POSTPARTUM
Anesthesia: Epidural

## 2013-07-01 MED ORDER — LACTATED RINGERS IV SOLN: INTRAVENOUS | Status: DC

## 2013-07-01 MED ORDER — ONDANSETRON HCL 4 MG/2ML IJ SOLN
INTRAMUSCULAR | Status: AC
  Filled 2013-07-01: qty 2

## 2013-07-01 MED ORDER — PNEUMOCOCCAL VAC POLYVALENT 25 MCG/0.5ML IJ INJ
0.5000 mL | INJECTION | INTRAMUSCULAR | Status: AC
  Administered 2013-07-02: 0.5 mL via INTRAMUSCULAR
  Filled 2013-07-01: qty 0.5

## 2013-07-01 MED ORDER — SODIUM BICARBONATE 8.4 % IV SOLN
INTRAVENOUS | Status: AC
  Filled 2013-07-01: qty 50

## 2013-07-01 MED ORDER — LANOLIN HYDROUS EX OINT: TOPICAL_OINTMENT | CUTANEOUS | Status: DC | PRN

## 2013-07-01 MED ORDER — IBUPROFEN 600 MG PO TABS
600.0000 mg | ORAL_TABLET | Freq: Four times a day (QID) | ORAL | Status: DC
  Administered 2013-07-01 – 2013-07-02 (×5): 600 mg via ORAL
  Filled 2013-07-01 (×5): qty 1

## 2013-07-01 MED ORDER — LACTATED RINGERS IV SOLN
INTRAVENOUS | Status: DC | PRN
  Administered 2013-07-01 (×2): via INTRAVENOUS

## 2013-07-01 MED ORDER — FENTANYL CITRATE 0.05 MG/ML IJ SOLN
INTRAMUSCULAR | Status: AC
  Filled 2013-07-01: qty 2

## 2013-07-01 MED ORDER — PROMETHAZINE HCL 25 MG/ML IJ SOLN
INTRAMUSCULAR | Status: AC
  Filled 2013-07-01: qty 1

## 2013-07-01 MED ORDER — ONDANSETRON HCL 4 MG/2ML IJ SOLN: 4.0000 mg | INTRAMUSCULAR | Status: DC | PRN

## 2013-07-01 MED ORDER — PROMETHAZINE HCL 25 MG/ML IJ SOLN: 6.2500 mg | INTRAMUSCULAR | Status: DC | PRN

## 2013-07-01 MED ORDER — LIDOCAINE-EPINEPHRINE (PF) 2 %-1:200000 IJ SOLN
INTRAMUSCULAR | Status: AC
  Filled 2013-07-01: qty 20

## 2013-07-01 MED ORDER — MIDAZOLAM HCL 2 MG/2ML IJ SOLN
INTRAMUSCULAR | Status: AC
  Filled 2013-07-01: qty 2

## 2013-07-01 MED ORDER — SODIUM BICARBONATE 8.4 % IV SOLN
INTRAVENOUS | Status: DC | PRN
  Administered 2013-07-01: 10 mL via EPIDURAL
  Administered 2013-07-01: 5 mL via EPIDURAL
  Administered 2013-07-01: 2 mL via EPIDURAL
  Administered 2013-07-01: 3 mL via EPIDURAL

## 2013-07-01 MED ORDER — WITCH HAZEL-GLYCERIN EX PADS: 1.0000 "application " | MEDICATED_PAD | CUTANEOUS | Status: DC | PRN

## 2013-07-01 MED ORDER — SENNOSIDES-DOCUSATE SODIUM 8.6-50 MG PO TABS
2.0000 | ORAL_TABLET | ORAL | Status: DC
  Administered 2013-07-01: 2 via ORAL

## 2013-07-01 MED ORDER — SIMETHICONE 80 MG PO CHEW
80.0000 mg | CHEWABLE_TABLET | ORAL | Status: DC | PRN
  Administered 2013-07-01: 80 mg via ORAL
  Filled 2013-07-01: qty 1

## 2013-07-01 MED ORDER — ONDANSETRON HCL 4 MG PO TABS: 4.0000 mg | ORAL_TABLET | ORAL | Status: DC | PRN

## 2013-07-01 MED ORDER — FENTANYL CITRATE 0.05 MG/ML IJ SOLN
INTRAMUSCULAR | Status: DC | PRN
  Administered 2013-07-01 (×3): 50 ug via INTRAVENOUS

## 2013-07-01 MED ORDER — FENTANYL CITRATE 0.05 MG/ML IJ SOLN: 25.0000 ug | INTRAMUSCULAR | Status: DC | PRN

## 2013-07-01 MED ORDER — PRENATAL MULTIVITAMIN CH
1.0000 | ORAL_TABLET | Freq: Every day | ORAL | Status: DC
  Administered 2013-07-01 – 2013-07-02 (×2): 1 via ORAL
  Filled 2013-07-01 (×2): qty 1

## 2013-07-01 MED ORDER — OXYCODONE-ACETAMINOPHEN 5-325 MG PO TABS
1.0000 | ORAL_TABLET | ORAL | Status: DC | PRN
  Administered 2013-07-01 – 2013-07-02 (×7): 2 via ORAL
  Filled 2013-07-01 (×7): qty 2

## 2013-07-01 MED ORDER — ZOLPIDEM TARTRATE 5 MG PO TABS: 5.0000 mg | ORAL_TABLET | Freq: Every evening | ORAL | Status: DC | PRN

## 2013-07-01 MED ORDER — BUPIVACAINE HCL (PF) 0.5 % IJ SOLN
INTRAMUSCULAR | Status: DC | PRN
  Administered 2013-07-01: 10 mL

## 2013-07-01 MED ORDER — ONDANSETRON HCL 4 MG/2ML IJ SOLN
INTRAMUSCULAR | Status: DC | PRN
  Administered 2013-07-01: 4 mg via INTRAVENOUS

## 2013-07-01 MED ORDER — BENZOCAINE-MENTHOL 20-0.5 % EX AERO: 1.0000 "application " | INHALATION_SPRAY | CUTANEOUS | Status: DC | PRN

## 2013-07-01 MED ORDER — METOCLOPRAMIDE HCL 10 MG PO TABS
10.0000 mg | ORAL_TABLET | Freq: Once | ORAL | Status: AC
  Administered 2013-07-01: 10 mg via ORAL
  Filled 2013-07-01: qty 1

## 2013-07-01 MED ORDER — FAMOTIDINE 20 MG PO TABS
40.0000 mg | ORAL_TABLET | Freq: Once | ORAL | Status: AC
  Administered 2013-07-01: 40 mg via ORAL
  Filled 2013-07-01: qty 2

## 2013-07-01 MED ORDER — TETANUS-DIPHTH-ACELL PERTUSSIS 5-2.5-18.5 LF-MCG/0.5 IM SUSP: 0.5000 mL | Freq: Once | INTRAMUSCULAR | Status: DC

## 2013-07-01 MED ORDER — BUPIVACAINE HCL (PF) 0.5 % IJ SOLN
INTRAMUSCULAR | Status: AC
  Filled 2013-07-01: qty 30

## 2013-07-01 MED ORDER — DIPHENHYDRAMINE HCL 25 MG PO CAPS: 25.0000 mg | ORAL_CAPSULE | Freq: Four times a day (QID) | ORAL | Status: DC | PRN

## 2013-07-01 MED ORDER — MIDAZOLAM HCL 5 MG/5ML IJ SOLN
INTRAMUSCULAR | Status: DC | PRN
  Administered 2013-07-01: 1 mg via INTRAVENOUS
  Administered 2013-07-01 (×2): 0.5 mg via INTRAVENOUS

## 2013-07-01 MED ORDER — DIBUCAINE 1 % RE OINT: 1.0000 "application " | TOPICAL_OINTMENT | RECTAL | Status: DC | PRN

## 2013-07-01 SURGICAL SUPPLY — 16 items
CLIP FILSHIE TUBAL LIGA STRL (Clip) ×2 IMPLANT
CLOTH BEACON ORANGE TIMEOUT ST (SAFETY) ×2 IMPLANT
DRSG COVADERM 4X10 (GAUZE/BANDAGES/DRESSINGS) ×2 IMPLANT
DURAPREP 26ML APPLICATOR (WOUND CARE) ×4 IMPLANT
GLOVE BIO SURGEON STRL SZ 6.5 (GLOVE) ×2 IMPLANT
GOWN PREVENTION PLUS LG XLONG (DISPOSABLE) ×4 IMPLANT
NS IRRIG 1000ML POUR BTL (IV SOLUTION) ×2 IMPLANT
PACK ABDOMINAL MINOR (CUSTOM PROCEDURE TRAY) ×2 IMPLANT
STRIP CLOSURE SKIN 1/4X3 (GAUZE/BANDAGES/DRESSINGS) ×2 IMPLANT
SUT CHROMIC 0 CT 1 (SUTURE) IMPLANT
SUT VIC AB 0 CT1 27 (SUTURE) ×2
SUT VIC AB 0 CT1 27XBRD ANBCTR (SUTURE) ×1 IMPLANT
SUT VICRYL 4-0 PS2 18IN ABS (SUTURE) ×2 IMPLANT
TOWEL OR 17X24 6PK STRL BLUE (TOWEL DISPOSABLE) ×4 IMPLANT
TRAY FOLEY CATH 14FR (SET/KITS/TRAYS/PACK) ×2 IMPLANT
WATER STERILE IRR 1000ML POUR (IV SOLUTION) ×2 IMPLANT

## 2013-07-01 NOTE — Lactation Note (Signed)
This note was copied from the chart of Stacy [name]. Lactation Consultation Note: Initial visit with this experienced BF mom. She reports that baby has been latching well but is still hungry so she has been giving a little formula. Encouraged to always BF first then give formula. Baby asleep in bassinet at this time. BF brochure given with resources for support after DC. No questions at present. To call prn    Patient Name: Stacy Sabena Winner [id number] Date: 07/01/2013 Reason for consult: Initial assessment   Maternal Data Formula Feeding for Exclusion: No Infant to breast within first hour of birth: Yes Does the patient have breastfeeding experience prior to this delivery?: Yes  Feeding   LATCH Score/Interventions                      Lactation Tools Discussed/Used     Consult Status Consult Status: PRN    Pamelia Hoit 07/01/2013, 1:14 PM

## 2013-07-01 NOTE — Anesthesia Preprocedure Evaluation (Signed)
Anesthesia Evaluation  Patient identified by MRN, date of birth, ID band Patient awake    Reviewed: Allergy & Precautions, H&P , Patient's Chart, lab work & pertinent test results  Airway Mallampati: II TM Distance: >3 FB Neck ROM: full    Dental  (+) Teeth Intact   Pulmonary asthma , former smoker,  breath sounds clear to auscultation        Cardiovascular Rhythm:regular Rate:Normal     Neuro/Psych    GI/Hepatic   Endo/Other    Renal/GU      Musculoskeletal   Abdominal   Peds  Hematology   Anesthesia Other Findings       Reproductive/Obstetrics (+) Pregnancy                           Anesthesia Physical Anesthesia Plan  ASA: II  Anesthesia Plan: Epidural   Post-op Pain Management:    Induction:   Airway Management Planned:   Additional Equipment:   Intra-op Plan:   Post-operative Plan:   Informed Consent: I have reviewed the patients History and Physical, chart, labs and discussed the procedure including the risks, benefits and alternatives for the proposed anesthesia with the patient or authorized representative who has indicated his/her understanding and acceptance.   Dental Advisory Given  Plan Discussed with:   Anesthesia Plan Comments: (Labs checked- platelets confirmed with RN in room. Fetal heart tracing, per RN, reported to be stable enough for sitting procedure. Discussed epidural, and patient consents to the procedure:  included risk of possible headache,backache, failed block, allergic reaction, and nerve injury. This patient was asked if she had any questions or concerns before the procedure started.)        Anesthesia Quick Evaluation  

## 2013-07-01 NOTE — Op Note (Signed)
Kelicia A Clarida 06/30/2013 - 07/01/2013  PREOPERATIVE DIAGNOSES: Multiparity, undesired fertility  POSTOPERATIVE DIAGNOSES: Multiparity, undesired fertility  PROCEDURE:  Postpartum Bilateral Tubal Sterilization using Filshie Clips   SURGEON: Dr.  Nicholaus Bloom, Dr. Rulon Abide  ANESTHESIA:  Epidural and local analgesia using 30 ml of 0.5% Marcaine  COMPLICATIONS:  None immediate.  ESTIMATED BLOOD LOSS: 25 ml.  FLUIDS: 500 ml LR.   INDICATIONS:  27 y.o. [id number] with undesired fertility,status post vaginal delivery, desires permanent sterilization.  Other reversible forms of contraception were discussed with patient; she declines all other modalities. Risks of procedure discussed with patient including but not limited to: risk of regret, permanence of method, bleeding, infection, injury to surrounding organs and need for additional procedures.  Failure risk of 1 -2 % with increased risk of ectopic gestation if pregnancy occurs was also discussed with patient.      FINDINGS:  Normal uterus, tubes, and ovaries.  PROCEDURE DETAILS: The patient was taken to the operating room where her epidural anesthesia was dosed up to surgical level and found to be adequate.  She was then placed in the dorsal supine position and prepped and draped in sterile fashion.  After an adequate timeout was performed, attention was turned to the patient's abdomen where a small transverse skin incision was made under the umbilical fold. The incision was taken down to the layer of fascia using the scalpel, and fascia was incised, and extended bilaterally using Mayo scissors. The peritoneum was entered in a sharp fashion. Attention was then turned to the patient's uterus, and left fallopian tube was identified and followed out to the fimbriated end.  A Filshie clip was placed on the left fallopian tube about 3 cm from the cornual attachment, with care given to incorporate the underlying mesosalpinx.  A similar process was  carried out on the right side allowing for bilateral tubal sterilization.  Good hemostasis was noted overall.  Local analgesia was injected into both Filshie application sites.The instruments were then removed from the patient's abdomen and the fascial incision was repaired with 0 Vicryl, and the skin was closed with a 4-0 Vicryl subcuticular stitch. The patient tolerated the procedure well.  Instrument, sponge, and needle counts were correct times two.  The patient was then taken to the recovery room awake and in stable condition.  Rulon Abide, MD OB fellow

## 2013-07-01 NOTE — Anesthesia Postprocedure Evaluation (Signed)
  Anesthesia Post-op Note  Patient: Stacy [name]  Procedure(s) Performed: * No procedures listed *  Patient Location: Mother/Baby  Anesthesia Type:Epidural  Level of Consciousness: awake, alert , oriented and patient cooperative  Airway and Oxygen Therapy: Patient Spontanous Breathing  Post-op Pain: mild  Post-op Assessment: Patient's Cardiovascular Status Stable, Respiratory Function Stable, No headache, No backache, No residual numbness and No residual motor weakness  Post-op Vital Signs: stable  Complications: No apparent anesthesia complications

## 2013-07-01 NOTE — Transfer of Care (Signed)
Immediate Anesthesia Transfer of Care Note  Patient: Stacy [name]  Procedure(s) Performed: Procedure(s): POST PARTUM TUBAL LIGATION (N/A)  Patient Location: PACU  Anesthesia Type:Epidural  Level of Consciousness: awake, alert  and oriented  Airway & Oxygen Therapy: Patient Spontanous Breathing  Post-op Assessment: Report given to PACU RN and Post -op Vital signs reviewed and stable  Post vital signs: Reviewed and stable  Complications: No apparent anesthesia complications

## 2013-07-01 NOTE — Anesthesia Postprocedure Evaluation (Signed)
  Anesthesia Post-op Note  Patient: Stacy [name]  Procedure(s) Performed: Procedure(s): POST PARTUM TUBAL LIGATION (N/A)  Patient Location: Mother/Baby  Anesthesia Type:Epidural  Level of Consciousness: awake, alert , oriented and patient cooperative  Airway and Oxygen Therapy: Patient Spontanous Breathing  Post-op Pain: mild  Post-op Assessment: Patient's Cardiovascular Status Stable, Respiratory Function Stable, No headache, No backache, No residual numbness and No residual motor weakness  Post-op Vital Signs: stable  Complications: No apparent anesthesia complications

## 2013-07-01 NOTE — Anesthesia Postprocedure Evaluation (Signed)
  Anesthesia Post-op Note  Patient: Stacy [name]  Procedure(s) Performed: Procedure(s): POST PARTUM TUBAL LIGATION (N/A)  Patient is awake, responsive, moving her legs, and has signs of resolution of her numbness. Pain and nausea are reasonably well controlled. Vital signs are stable and clinically acceptable. Oxygen saturation is clinically acceptable. There are no apparent anesthetic complications at this time. Patient is ready for discharge.

## 2013-07-01 NOTE — Progress Notes (Signed)
Stacy [name] is a 27 y.o. [id number] at [redacted]w[redacted]d admitted for latent labor and less than reassuring NST  Subjective: Comfortable after epidural. No complaints at this time. Pt AROM'd at 10 oclock check. Was nauseated and rec'd phenergan  Objective: BP 115/71  Pulse 91  Temp(Src) 98.5 F (36.9 C) (Oral)  Resp 18  Ht 5\' 5"  (1.651 m)  Wt 87.544 kg (193 lb)  BMI 32.12 kg/m2  SpO2 100%  LMP 09/15/2012    Filed Vitals:   06/30/13 2300 06/30/13 2330 07/01/13 0000 07/01/13 0030  BP: 105/50 112/68 107/42 115/71  Pulse: 89 101 90 91  Temp:   98.5 F (36.9 C)   TempSrc:   Oral   Resp: 20  18   Height:      Weight:      SpO2:           FHT:  FHR: 140 bpm, variability: moderate,  accelerations:  Abscent,  decelerations:  Absent UC:   regular, every 2-3 minutes SVE:   Dilation: 7.5 Effacement (%): 100 Station: -1 Exam by:: foley,rn  Labs: Lab Results  Component Value Date   WBC 12.7* 06/30/2013   HGB 11.9* 06/30/2013   HCT 34.6* 06/30/2013   MCV 86.7 06/30/2013   PLT 244 06/30/2013    Assessment / Plan: Spontaneous labor, progressing normally  Labor: pt now making change and improved ctx pt s/p AROM Preeclampsia:  BPs improved. No labs sent. No issue at this time. Fetal Wellbeing:  Category I Pain Control:  Epidural I/D:  n/a Anticipated MOD:  NSVD  ODOM, MICHAEL RYAN 07/01/2013, 12:47 AM

## 2013-07-02 ENCOUNTER — Encounter (HOSPITAL_COMMUNITY): Payer: Self-pay | Admitting: Obstetrics & Gynecology

## 2013-07-02 MED ORDER — OXYCODONE-ACETAMINOPHEN 5-325 MG PO TABS: 1.0000 | ORAL_TABLET | ORAL | Status: DC | PRN

## 2013-07-02 MED ORDER — IBUPROFEN 600 MG PO TABS: 600.0000 mg | ORAL_TABLET | Freq: Four times a day (QID) | ORAL | Status: DC

## 2013-07-02 NOTE — Progress Notes (Signed)
UR chart review completed.  

## 2013-07-02 NOTE — Discharge Summary (Signed)
Obstetric Discharge Summary Reason for Admission: onset of labor Prenatal Procedures: NST Intrapartum Procedures: spontaneous vaginal delivery Postpartum Procedures: P.P. tubal ligation Complications-Operative and Postpartum: none Hemoglobin  Date Value Range Status  07/01/2013 10.2* 12.0 - 15.0 g/dL Final     HCT  Date Value Range Status  07/01/2013 30.1* 36.0 - 46.0 % Final    Physical Exam:  General: alert, cooperative and no distress Lochia: appropriate Uterine Fundus: firm Incision: dressing covering site but without significant drainage.  DVT Evaluation: No cords or calf tenderness. No significant calf/ankle edema.  Discharge Diagnoses: Term Pregnancy-delivered and postpartum tubal ligation  Discharge Information: Date: 07/02/2013 Activity: pelvic rest Diet: routine Medications: PNV, Ibuprofen and Percocet Condition: stable Instructions: refer to practice specific booklet Discharge to: home Follow-up Information   Follow up with [facility]. Schedule an appointment as soon as possible for a visit in 4 weeks.   Specialty:  Obstetrics and Gynecology   Contact information:   [address] [place] [address] [phone]      Newborn Data: Live born female  Birth Weight: 6 lb 3.5 oz (2820 g) APGAR: 9, 9  Home with mother.  Pt presented in active labor at term and progressed to deliver a liveborn female without complication.  She had a pp BTL on pp day #1 was dishcharged on day 2. No complications. She is breast feeding.   BECK, KELI L 07/02/2013, 8:54 AM

## 2013-07-03 ENCOUNTER — Encounter: Payer: Medicaid Other | Admitting: Advanced Practice Midwife

## 2013-07-30 ENCOUNTER — Encounter: Payer: Self-pay | Admitting: *Deleted

## 2013-07-31 ENCOUNTER — Ambulatory Visit: Payer: Medicaid Other | Admitting: Advanced Practice Midwife

## 2013-09-16 ENCOUNTER — Telehealth: Payer: Self-pay

## 2013-09-16 DIAGNOSIS — G4700 Insomnia, unspecified: Secondary | ICD-10-CM

## 2013-09-16 NOTE — Telephone Encounter (Signed)
Pt. Called and stated that she has an appointment 09/30/13 but that she is out of her Ambien and she needs this to sleep. Would like to know if Ambien can be prescribed and sent to her [facility] on [address].

## 2013-09-17 MED ORDER — ZOLPIDEM TARTRATE 10 MG PO TABS
10.0000 mg | ORAL_TABLET | Freq: Every evening | ORAL | Status: DC | PRN
Start: 2013-09-17 — End: 2013-10-14

## 2013-09-17 NOTE — Telephone Encounter (Addendum)
Stacy [name] called again , states she is calling back about getting ambien refilled- because she is not sleeping good and appt not until 16th.  Discussed with Stacy [name] , will refill enough to last until postpartum visit. Called Stacy [name] and notified her.

## 2013-09-30 ENCOUNTER — Ambulatory Visit: Payer: Medicaid Other | Admitting: Obstetrics & Gynecology

## 2013-10-14 ENCOUNTER — Encounter: Payer: Self-pay | Admitting: Obstetrics & Gynecology

## 2013-10-14 ENCOUNTER — Ambulatory Visit (INDEPENDENT_AMBULATORY_CARE_PROVIDER_SITE_OTHER): Payer: Medicaid Other | Admitting: Obstetrics & Gynecology

## 2013-10-14 VITALS — BP 113/82 | HR 97 | Temp 98.0°F | Ht 65.0 in | Wt 194.0 lb

## 2013-10-14 DIAGNOSIS — F3289 Other specified depressive episodes: Secondary | ICD-10-CM

## 2013-10-14 DIAGNOSIS — F329 Major depressive disorder, single episode, unspecified: Secondary | ICD-10-CM

## 2013-10-14 DIAGNOSIS — O99345 Other mental disorders complicating the puerperium: Secondary | ICD-10-CM

## 2013-10-14 DIAGNOSIS — G4700 Insomnia, unspecified: Secondary | ICD-10-CM

## 2013-10-14 DIAGNOSIS — F53 Postpartum depression: Secondary | ICD-10-CM

## 2013-10-14 MED ORDER — SERTRALINE HCL 50 MG PO TABS: 50.0000 mg | ORAL_TABLET | Freq: Every day | ORAL | Status: DC

## 2013-10-14 MED ORDER — ZOLPIDEM TARTRATE 10 MG PO TABS: 10.0000 mg | ORAL_TABLET | Freq: Every evening | ORAL | Status: AC | PRN

## 2013-10-14 MED ORDER — ZOLPIDEM TARTRATE 10 MG PO TABS: 10.0000 mg | ORAL_TABLET | Freq: Every evening | ORAL | Status: DC | PRN

## 2013-10-14 MED ORDER — SERTRALINE HCL 25 MG PO TABS: 25.0000 mg | ORAL_TABLET | Freq: Every day | ORAL | Status: DC

## 2013-10-14 NOTE — Progress Notes (Signed)
Patient ID: Stacy [name], female   DOB: [birth year], 28 y.o.   MRN: [id number] Subjective:     Stacy [name] is a 28 y.o. female who presents for a postpartum visit. She is 3 months postpartum following a spontaneous vaginal delivery with PPBTL. I have fully reviewed the prenatal and intrapartum course. The delivery was at 40 gestational weeks. Outcome: spontaneous vaginal delivery. Anesthesia: epidural. Postpartum course has been complicated with depression for the past 4 weeks.. Baby's course has been unremarkable. Baby is feeding by breast and bottle. Bleeding no bleeding. Bowel function is consitpation- Miralax 1 cap prn not helping . Bladder function is normal. Patient is sexually active. Contraception method is tubal ligation. Postpartum depression screening: positive.  The following portions of the patient's history were reviewed and updated as appropriate: allergies, current medications, past family history, past medical history, past social history, past surgical history and problem list.  Review of Systems Pertinent items are noted in HPI.   Objective:    BP 113/82  Pulse 97  Temp(Src) 98 F (36.7 C)  Ht 5\' 5"  (1.651 m)  Wt 194 lb (87.998 kg)  BMI 32.28 kg/m2  LMP 10/13/2013  Breastfeeding? Yes  General:  alert and no distress                                      Assessment:    3 month postpartum exam. Pap smear not done at today's visit.  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  Agrees to meds. Constipation  Plan:    1. Contraception: tubal ligation 2. Post partum depression- wants meds. Declined social work visit or referral to cournselor  3. Follow up in: 4 weeks or as needed.  4. Refilled Ambien- pt reports that husband is home night and cares for the baby at night 5. zoloft 25mg  daily x 2 weeks.  In crease to 50mg  q day 6.Needs referral to primary care to follow depression 7. miralax 2 capfuls daily.  Increase as needed

## 2013-10-14 NOTE — Progress Notes (Signed)
Pt reports that she feels sad and is crying daily.

## 2013-10-14 NOTE — Patient Instructions (Signed)

## 2013-11-11 ENCOUNTER — Encounter: Payer: Self-pay | Admitting: Obstetrics & Gynecology

## 2013-11-11 ENCOUNTER — Ambulatory Visit (INDEPENDENT_AMBULATORY_CARE_PROVIDER_SITE_OTHER): Payer: Medicaid Other | Admitting: Obstetrics & Gynecology

## 2013-11-11 VITALS — BP 135/88 | Temp 97.6°F | Ht 65.0 in | Wt 193.0 lb

## 2013-11-11 DIAGNOSIS — F3289 Other specified depressive episodes: Secondary | ICD-10-CM

## 2013-11-11 DIAGNOSIS — F329 Major depressive disorder, single episode, unspecified: Secondary | ICD-10-CM

## 2013-11-11 DIAGNOSIS — F53 Postpartum depression: Secondary | ICD-10-CM

## 2013-11-11 DIAGNOSIS — G4700 Insomnia, unspecified: Secondary | ICD-10-CM

## 2013-11-11 DIAGNOSIS — N920 Excessive and frequent menstruation with regular cycle: Secondary | ICD-10-CM

## 2013-11-11 DIAGNOSIS — O99345 Other mental disorders complicating the puerperium: Secondary | ICD-10-CM

## 2013-11-11 LAB — CBC
HCT: 36.6 % (ref 36.0–46.0)
Hemoglobin: 12.5 g/dL (ref 12.0–15.0)
MCH: 29.3 pg (ref 26.0–34.0)
MCHC: 34.2 g/dL (ref 30.0–36.0)
MCV: 85.7 fL (ref 78.0–100.0)
Platelets: 331 10*3/uL (ref 150–400)
RBC: 4.27 MIL/uL (ref 3.87–5.11)
RDW: 14.9 % (ref 11.5–15.5)
WBC: 8.1 10*3/uL (ref 4.0–10.5)

## 2013-11-11 MED ORDER — NORGESTIMATE-ETH ESTRADIOL 0.25-35 MG-MCG PO TABS: 1.0000 | ORAL_TABLET | Freq: Every day | ORAL | Status: DC

## 2013-11-11 MED ORDER — SERTRALINE HCL 50 MG PO TABS: 50.0000 mg | ORAL_TABLET | Freq: Every day | ORAL | Status: DC

## 2013-11-11 MED ORDER — ZOLPIDEM TARTRATE 10 MG PO TABS: 10.0000 mg | ORAL_TABLET | Freq: Every evening | ORAL | Status: AC | PRN

## 2013-11-11 NOTE — Progress Notes (Signed)
Subjective:     Patient ID: Stacy [name], female   DOB: 01-02-1986, 28 y.o.   MRN: [id number]  HPI Pt reports that her mood is improved since restarting the Zoloft.  She denies worrisome side effect.  She does  Report some heavy bleeding that was occuring PRIOR to her delivery.  She c/o weakness due to the heavy bleding.  No bleeding between cycles.  Review of Systems     Objective:   Physical Exam BP 135/88  Temp(Src) 97.6 F (36.4 C) (Oral)  Ht 5\' 5"  (1.651 m)  Wt 193 lb (87.544 kg)  BMI 32.12 kg/m2  LMP 11/08/2013  Breastfeeding? No Pt in NAD Exam deferred      Assessment:     Menorrhagia Depression- improved on Zoloft     Plan:    CBC Sprintec 1 po q day Zoloft 50 mg daily  F/u in 6 months

## 2013-11-11 NOTE — Patient Instructions (Signed)
Menorrhagia  Menorrhagia is the medical term for when your menstrual periods are heavy or last longer than usual. With menorrhagia, every period you have may cause enough blood loss and cramping that you are unable to maintain your usual activities.  CAUSES   In some cases, the cause of heavy periods is unknown, but a number of conditions may cause menorrhagia. Common causes include:  · A problem with the hormone-producing thyroid gland (hypothyroid).  · Noncancerous growths in the uterus (polyps or fibroids).  · An imbalance of the estrogen and progesterone hormones.  · One of your ovaries not releasing an egg during one or more months.  · Side effects of having an intrauterine device (IUD).  · Side effects of some medicines, such as anti-inflammatory medicines or blood thinners.  · A bleeding disorder that stops your blood from clotting normally.  SIGNS AND SYMPTOMS   During a normal period, bleeding lasts between 4 and 8 days. Signs that your periods are too heavy include:  · You routinely have to change your pad or tampon every 1 or 2 hours because it is completely soaked.  · You pass blood clots larger than 1 inch (2.5 cm) in size.  · You have bleeding for more than 7 days.  · You need to use pads and tampons at the same time because of heavy bleeding.  · You need to wake up to change your pads or tampons during the night.  · You have symptoms of anemia, such as tiredness, fatigue, or shortness of breath.   DIAGNOSIS   Your health care provider will perform a physical exam and ask you questions about your symptoms and menstrual history. Other tests may be ordered based on what the health care provider finds during the exam. These tests can include:  · Blood tests To check if you are pregnant or have hormonal changes, a bleeding or thyroid disorder, low iron levels (anemia), or other problems.  · Endometrial biopsy Your health care provider takes a sample of tissue from the inside of your uterus to be examined  under a microscope.  · Pelvic ultrasound This test uses sound waves to make a picture of your uterus, ovaries, and vagina. The pictures can show if you have fibroids or other growths.  · Hysteroscopy For this test, your health care provider will use a small telescope to look inside your uterus.  Based on the results of your initial tests, your health care provider may recommend further testing.  TREATMENT   Treatment may not be needed. If it is needed, your health care provider may recommend treatment with one or more medicines first. If these do not reduce bleeding enough, a surgical treatment might be an option. The best treatment for you will depend on:   · Whether you need to prevent pregnancy.    · Your desire to have children in the future.  · The cause and severity of your bleeding.  · Your opinion and personal preference.    Medicines for menorrhagia may include:  · Birth control methods that use hormones These include the pill, skin patch, vaginal ring, shots that you get every 3 months, hormonal IUD, and implant. These treatments reduce bleeding during your menstrual period.  · Medicines that thicken blood and slow bleeding.  · Medicines that reduce swelling, such as ibuprofen.   · Medicines that contain a synthetic hormone called progestin.    · Medicines that make the ovaries stop working for a short time.    You may need surgical   treatment for menorrhagia if the medicines are unsuccessful. Treatment options include:  · Dilation and curettage (D&C) In this procedure, your health care provider opens (dilates) your cervix and then scrapes or suctions tissue from the lining of your uterus to reduce menstrual bleeding.  · Operative hysteroscopy This procedure uses a tiny tube with a light (hysteroscope) to view your uterine cavity and can help in the surgical removal of a polyp that may be causing heavy periods.  · Endometrial ablation Through various techniques, your health care provider permanently  destroys the entire lining of your uterus (endometrium). After endometrial ablation, most women have little or no menstrual flow. Endometrial ablation reduces your ability to become pregnant.  · Endometrial resection This surgical procedure uses an electrosurgical wire loop to remove the lining of the uterus. This procedure also reduces your ability to become pregnant.  · Hysterectomy Surgical removal of the uterus and cervix is a permanent procedure that stops menstrual periods. Pregnancy is not possible after a hysterectomy. This procedure requires anesthesia and hospitalization.  HOME CARE INSTRUCTIONS   · Only take over-the-counter or prescription medicines as directed by your health care provider. Take prescribed medicines exactly as directed. Do not change or switch medicines without consulting your health care provider.  · Take any prescribed iron pills exactly as directed by your health care provider. Long-term heavy bleeding may result in low iron levels. Iron pills help replace the iron your body lost from heavy bleeding. Iron may cause constipation. If this becomes a problem, increase the bran, fruits, and roughage in your diet.  · Do not take aspirin or medicines that contain aspirin 1 week before or during your menstrual period. Aspirin may make the bleeding worse.  · If you need to change your sanitary pad or tampon more than once every 2 hours, stay in bed and rest as much as possible until the bleeding stops.  · Eat well-balanced meals. Eat foods high in iron. Examples are leafy green vegetables, meat, liver, eggs, and whole grain breads and cereals. Do not try to lose weight until the abnormal bleeding has stopped and your blood iron level is back to normal.  SEEK MEDICAL CARE IF:   · You soak through a pad or tampon every 1 or 2 hours, and this happens every time you have a period.  · You need to use pads and tampons at the same time because you are bleeding so much.  · You need to change your pad  or tampon during the night.  · You have a period that lasts for more than 8 days.  · You pass clots bigger than 1 inch wide.  · You have irregular periods that happen more or less often than once a month.  · You feel dizzy or faint.  · You feel very weak or tired.  · You feel short of breath or feel your heart is beating too fast when you exercise.  · You have nausea and vomiting or diarrhea while you are taking your medicine.  · You have any problems that may be related to the medicine you are taking.  SEEK IMMEDIATE MEDICAL CARE IF:   · You soak through 4 or more pads or tampons in 2 hours.  · You have any bleeding while you are pregnant.  MAKE SURE YOU:   · Understand these instructions.  · Will watch your condition.  · Will get help right away if you are not doing well or get worse.    Document Released: 07/04/2005 Document Revised: 04/24/2013 Document Reviewed: 12/23/2012  [employer]® Patient Information ©2014 [employer].

## 2013-11-14 ENCOUNTER — Ambulatory Visit: Payer: Medicaid Other | Admitting: Obstetrics & Gynecology

## 2013-11-20 ENCOUNTER — Encounter (HOSPITAL_COMMUNITY): Payer: Self-pay | Admitting: Emergency Medicine

## 2013-11-20 ENCOUNTER — Emergency Department (HOSPITAL_COMMUNITY)
Admission: EM | Admit: 2013-11-20 | Discharge: 2013-11-20 | Disposition: A | Discharge status: Home / Self Care | Payer: Medicaid Other | Attending: Emergency Medicine | Admitting: Emergency Medicine

## 2013-11-20 DIAGNOSIS — Z87891 Personal history of nicotine dependence: Secondary | ICD-10-CM | POA: Insufficient documentation

## 2013-11-20 DIAGNOSIS — M79605 Pain in left leg: Secondary | ICD-10-CM

## 2013-11-20 DIAGNOSIS — M79609 Pain in unspecified limb: Secondary | ICD-10-CM | POA: Insufficient documentation

## 2013-11-20 DIAGNOSIS — M79604 Pain in right leg: Secondary | ICD-10-CM

## 2013-11-20 DIAGNOSIS — Z789 Other specified health status: Secondary | ICD-10-CM | POA: Insufficient documentation

## 2013-11-20 DIAGNOSIS — J45909 Unspecified asthma, uncomplicated: Secondary | ICD-10-CM | POA: Insufficient documentation

## 2013-11-20 DIAGNOSIS — Z87440 Personal history of urinary (tract) infections: Secondary | ICD-10-CM | POA: Insufficient documentation

## 2013-11-20 DIAGNOSIS — M7989 Other specified soft tissue disorders: Secondary | ICD-10-CM

## 2013-11-20 DIAGNOSIS — Z791 Long term (current) use of non-steroidal anti-inflammatories (NSAID): Secondary | ICD-10-CM | POA: Insufficient documentation

## 2013-11-20 DIAGNOSIS — R209 Unspecified disturbances of skin sensation: Secondary | ICD-10-CM | POA: Insufficient documentation

## 2013-11-20 DIAGNOSIS — Z79899 Other long term (current) drug therapy: Secondary | ICD-10-CM | POA: Insufficient documentation

## 2013-11-20 MED ORDER — IBUPROFEN 400 MG PO TABS
800.0000 mg | ORAL_TABLET | Freq: Once | ORAL | Status: AC
  Administered 2013-11-20: 800 mg via ORAL
  Filled 2013-11-20: qty 2

## 2013-11-20 MED ORDER — TRAMADOL HCL 50 MG PO TABS: 50.0000 mg | ORAL_TABLET | Freq: Four times a day (QID) | ORAL | Status: DC | PRN

## 2013-11-20 NOTE — ED Notes (Signed)
[place] tech here

## 2013-11-20 NOTE — Progress Notes (Signed)
Bilateral lower extremity venous duplex:  No evidence of DVT, superficial thrombosis, or Baker's Cyst.   

## 2013-11-20 NOTE — Discharge Instructions (Signed)
For pain control you may take:  800mg  of ibuprofen (that is usually 4 over the counter pills)  3 times a day (take with food) and acetaminophen 975mg  (this is 3 over the counter pills) four times a day. Do not drink alcohol or combine with other medications that have acetaminophen as an ingredient (Read the labels!).  For breakthrough pain you may take Tramadol. Do not drink alcohol drive or operate heavy machinery when taking Tramadol.  Do not hesitate to return to the Emergency Department for any new, worsening or concerning symptoms.   If you do not have a primary care doctor you can establish one at the   [facility]: [address] [place] [address] [place] [phone]  After you establish care. Let them know you were seen in the emergency room. They must obtain records for further management.

## 2013-11-20 NOTE — ED Notes (Signed)
Pain in bi-lateral lower leg pain for one week after taking bcp

## 2013-11-20 NOTE — ED Notes (Signed)
Per pt sts knots popping up on legs since she has been taking birth control. sts painful. sts makes her legs go numb.

## 2013-11-20 NOTE — ED Provider Notes (Signed)
CSN: [id number]     Arrival date & time 11/20/13  1433 History   First MD Initiated Contact with Patient 11/20/13 1643 This chart was scribed for non-physician practitioner Wynetta Emery, PA-C working with Merrie Roof, * by Valera Castle, ED scribe. This patient was seen in room TR09C/TR09C and the patient's care was started at 4:44 PM.  Chief Complaint  Patient presents with  . Leg Pain   (Consider location/radiation/quality/duration/timing/severity/associated sxs/prior Treatment) HPI HPI Comments: Stacy [name] is a 28 y.o. female who presents to the Emergency Department complaining of gradually worsening, constantly painful knots over her bilateral LE since starting on her birth control. She reports intermittent numbness in her bilateral LE, and reports associated, intermittent bilateral calf pain, but states her pain is mostly over her bilateral shins where the knots are. She denies cough, fever, SOB, chest pain, and any other associated symptoms. She reports h/o asthma, but states she has not had to use her inhaler in 2 months.  PCP - No PCP Per Patient  Past Medical History  Diagnosis Date  . UTI (urinary tract infection)   . Medical history non-contributory   . Asthma    Past Surgical History  Procedure Laterality Date  . Cystectomy    . Wisdom tooth extraction    . Pilonidal cyst excision    . Tubal ligation N/A 07/01/2013    Procedure: POST PARTUM TUBAL LIGATION;  Surgeon: Allie Bossier, MD;  Location: [facility];  Service: Gynecology;  Laterality: N/A;   Family History  Problem Relation Age of Onset  . Alcohol abuse Neg Hx   . Arthritis Neg Hx   . Asthma Neg Hx   . Birth defects Neg Hx   . Cancer Neg Hx   . COPD Neg Hx   . Depression Neg Hx   . Diabetes Neg Hx   . Drug abuse Neg Hx   . Early death Neg Hx   . Hearing loss Neg Hx   . Heart disease Neg Hx   . Hyperlipidemia Neg Hx   . Hypertension Neg Hx   . Kidney disease Neg Hx   . Learning  disabilities Neg Hx   . Mental illness Neg Hx   . Mental retardation Neg Hx   . Miscarriages / Stillbirths Neg Hx   . Stroke Neg Hx   . Vision loss Neg Hx    History  Substance Use Topics  . Smoking status: Former Smoker -- 0.25 packs/day    Types: Cigarettes  . Smokeless tobacco: Never Used     Comment: 4 CIGS A DAY- Stopped smoking when she had positive upt  . Alcohol Use: No   OB History   Grav Para Term Preterm Abortions TAB SAB Ect Mult Living   4 3 3  1 1    3      Review of Systems  Constitutional: Negative for fever.  Respiratory: Negative for cough and shortness of breath.   Cardiovascular: Negative for chest pain.  Musculoskeletal: Positive for arthralgias (painful knots over her bilateral shins) and myalgias (bilateral calf pain).  Skin: Negative for wound.  Neurological: Positive for numbness (over bilateral LE below her knees).   Allergies  Review of patient's allergies indicates no known allergies.  Home Medications   Prior to Admission medications   Medication Sig Start Date End Date Taking? Authorizing Provider  albuterol (PROVENTIL HFA;VENTOLIN HFA) 108 (90 BASE) MCG/ACT inhaler Inhale 2 puffs into the lungs every 6 (six) hours as needed  for wheezing. 06/26/13   Deirdre C Poe, CNM  ibuprofen (ADVIL,MOTRIN) 600 MG tablet Take 1 tablet (600 mg total) by mouth every 6 (six) hours. 07/02/13   Vale Haven, MD  norgestimate-ethinyl estradiol (ORTHO-CYCLEN,SPRINTEC,PREVIFEM) 0.25-35 MG-MCG tablet Take 1 tablet by mouth daily. 11/11/13   Willodean Rosenthal, MD  oxyCODONE-acetaminophen (PERCOCET/ROXICET) 5-325 MG per tablet Take 1-2 tablets by mouth every 4 (four) hours as needed for severe pain (moderate - severe pain). 07/02/13   Vale Haven, MD  Prenatal Vit-Fe Fumarate-FA (PRENATAL MULTIVITAMIN) TABS tablet Take 1 tablet by mouth daily at 12 noon. 06/18/13   Minta Balsam, MD  sertraline (ZOLOFT) 25 MG tablet Take 1 tablet (25 mg total) by mouth daily. 10/14/13    Willodean Rosenthal, MD  sertraline (ZOLOFT) 50 MG tablet Take 1 tablet (50 mg total) by mouth daily. AFTER completing the 25mg  daily for 2 weeks 11/11/13   Willodean Rosenthal, MD  zolpidem (AMBIEN) 10 MG tablet Take 1 tablet (10 mg total) by mouth at bedtime as needed for sleep. 11/11/13 12/11/13  Willodean Rosenthal, MD   BP 137/80  Pulse 67  Temp(Src) 98.8 F (37.1 C) (Oral)  Resp 18  SpO2 100%  LMP 11/08/2013  Physical Exam  Nursing note and vitals reviewed. Constitutional: She is oriented to person, place, and time. She appears well-developed and well-nourished. No distress.  HENT:  Head: Normocephalic and atraumatic.  Mouth/Throat: Oropharynx is clear and moist.  Eyes: EOM are normal. Pupils are equal, round, and reactive to light.  Neck: Normal range of motion. Neck supple.  Cardiovascular: Normal rate, regular rhythm and intact distal pulses.   Pulmonary/Chest: Effort normal and breath sounds normal. No respiratory distress. She has no wheezes. She has no rales. She exhibits no tenderness.  Abdominal: Soft. Bowel sounds are normal. She exhibits no distension and no mass. There is no tenderness. There is no rebound and no guarding.  Musculoskeletal: Normal range of motion.  No calf asymmetry, superficial collaterals, palpable cords, edema, Homans sign negative bilaterally.    Neurological: She is alert and oriented to person, place, and time.  Skin: Skin is warm and dry.  Psychiatric: She has a normal mood and affect. Her behavior is normal.    ED Course  Procedures (including critical care time)  DIAGNOSTIC STUDIES: Oxygen Saturation is 100% on room air, normal by my interpretation.    COORDINATION OF CARE: 4:47 PM-Discussed treatment plan which includes pain medication with pt at bedside and pt agreed to plan.   Labs Review Labs Reviewed - No data to display  Imaging Review No results found.   EKG Interpretation None     Medications - No data to  display MDM   Final diagnoses:  None    Filed Vitals:   11/20/13 1459 11/20/13 1737  BP: 137/80 119/81  Pulse: 67 66  Temp: 98.8 F (37.1 C) 98 F (36.7 C)  TempSrc: Oral   Resp: 18 18  SpO2: 100% 98%    Medications  ibuprofen (ADVIL,MOTRIN) tablet 800 mg (800 mg Oral Given 11/20/13 1702)    Stacy [name] is a 28 y.o. female presenting with bilateral lower extremity pain. Patient is taking oral contraceptive medications. Sent by her PCP to rule out DVT. Verbal report from ultrasound tech negative for DVT. Patient encouraged to follow with primary care.  Evaluation does not show pathology that would require ongoing emergent intervention or inpatient treatment. Pt is hemodynamically stable and mentating appropriately. Discussed findings and plan with patient/guardian,  who agrees with care plan. All questions answered. Return precautions discussed and outpatient follow up given.   Discharge Medication List as of 11/20/2013  5:33 PM    START taking these medications   Details  traMADol (ULTRAM) 50 MG tablet Take 1 tablet (50 mg total) by mouth every 6 (six) hours as needed., Starting 11/20/2013, Until Discontinued, Print        Note: Portions of this report may have been transcribed using voice recognition software. Every effort was made to ensure accuracy; however, inadvertent computerized transcription errors may be present   I personally performed the services described in this documentation, which was scribed in my presence. The recorded information has been reviewed and is accurate.   Joni Reining [place], PA-C 11/21/13 1414

## 2013-11-22 NOTE — ED Provider Notes (Signed)
Medical screening examination/treatment/procedure(s) were performed by non-physician practitioner and as supervising physician I was immediately available for consultation/collaboration.   John David Wofford III, MD 11/22/13 1233 

## 2014-01-20 ENCOUNTER — Inpatient Hospital Stay
Admission: RE | Admit: 2014-01-20 | Discharge: 2014-01-20 | Disposition: A | Discharge status: Home / Self Care | Payer: Self-pay | Source: Ambulatory Visit | Attending: Family Medicine | Admitting: Family Medicine

## 2014-01-20 ENCOUNTER — Ambulatory Visit (HOSPITAL_COMMUNITY)
Admission: RE | Admit: 2014-01-20 | Discharge: 2014-01-20 | Disposition: A | Discharge status: Home / Self Care | Payer: Medicaid Other | Source: Ambulatory Visit | Attending: Family Medicine | Admitting: Family Medicine

## 2014-01-20 ENCOUNTER — Other Ambulatory Visit (HOSPITAL_COMMUNITY): Payer: Self-pay | Admitting: Family Medicine

## 2014-01-20 DIAGNOSIS — H209 Unspecified iridocyclitis: Secondary | ICD-10-CM

## 2014-01-20 DIAGNOSIS — Z0000 Encounter for general adult medical examination without abnormal findings: Secondary | ICD-10-CM | POA: Diagnosis present

## 2014-01-20 LAB — CBC
HCT: 38.4 % (ref 36.0–46.0)
Hemoglobin: 12.7 g/dL (ref 12.0–15.0)
MCH: 29.2 pg (ref 26.0–34.0)
MCHC: 33.1 g/dL (ref 30.0–36.0)
MCV: 88.3 fL (ref 78.0–100.0)
Platelets: 315 10*3/uL (ref 150–400)
RBC: 4.35 MIL/uL (ref 3.87–5.11)
RDW: 13.2 % (ref 11.5–15.5)
WBC: 8.4 10*3/uL (ref 4.0–10.5)

## 2014-01-20 LAB — COMPREHENSIVE METABOLIC PANEL
ALT: 20 U/L (ref 0–35)
AST: 14 U/L (ref 0–37)
Albumin: 3.8 g/dL (ref 3.5–5.2)
Alkaline Phosphatase: 87 U/L (ref 39–117)
Anion gap: 14 (ref 5–15)
BUN: 8 mg/dL (ref 6–23)
CO2: 24 mEq/L (ref 19–32)
Calcium: 9.2 mg/dL (ref 8.4–10.5)
Chloride: 102 mEq/L (ref 96–112)
Creatinine, Ser: 0.58 mg/dL (ref 0.50–1.10)
GFR calc Af Amer: >90 mL/min (ref >90)
GFR calc non Af Amer: >90 mL/min (ref >90)
Glucose, Bld: 111 mg/dL — ABNORMAL HIGH (ref 70–99)
Potassium: 3.5 mEq/L — ABNORMAL LOW (ref 3.7–5.3)
Sodium: 140 mEq/L (ref 137–147)
Total Bilirubin: 0.3 mg/dL (ref 0.3–1.2)
Total Protein: 7.8 g/dL (ref 6.0–8.3)

## 2014-01-20 LAB — RHEUMATOID FACTOR: Rhuematoid fact SerPl-aCnc: <10 IU/mL (ref <=14)

## 2014-01-21 LAB — ANGIOTENSIN CONVERTING ENZYME: Angiotensin-Converting Enzyme: 47 U/L (ref 8–52)

## 2014-01-21 LAB — ANCA TITERS: C-ANCA: 1:40 {titer} — ABNORMAL HIGH

## 2014-01-21 LAB — ANCA SCREEN W REFLEX TITER
Atypical p-ANCA Screen: NEGATIVE
c-ANCA Screen: POSITIVE — AB
p-ANCA Screen: NEGATIVE

## 2014-01-21 LAB — QUANTIFERON TB GOLD ASSAY (BLOOD)
Interferon Gamma Release Assay: NEGATIVE
Mitogen value: >10 IU/mL
Quantiferon Nil Value: 0.02 IU/mL
TB Ag value: 0.02 IU/mL
TB Antigen Minus Nil Value: 0 IU/mL

## 2014-01-21 LAB — RPR

## 2014-01-21 LAB — TOXOPLASMA ANTIBODIES- IGG AND  IGM
Toxoplasma Antibody- IgM: <3 IU/mL (ref <8.0)
Toxoplasma IgG Ratio: <3 IU/mL (ref <7.2)

## 2014-01-21 LAB — ANA: Anti Nuclear Antibody(ANA): NEGATIVE

## 2014-01-23 LAB — FLUORESCENT TREPONEMAL AB(FTA)-IGG-BLD: Fluorescent Treponemal Ab, IgG: NONREACTIVE

## 2014-01-24 LAB — HLA-B27 ANTIGEN: DNA Result:: NOT DETECTED

## 2014-02-02 ENCOUNTER — Emergency Department (HOSPITAL_COMMUNITY)
Admission: EM | Admit: 2014-02-02 | Discharge: 2014-02-03 | Disposition: A | Discharge status: Home / Self Care | Payer: Medicaid Other | Attending: Emergency Medicine | Admitting: Emergency Medicine

## 2014-02-02 DIAGNOSIS — H53149 Visual discomfort, unspecified: Secondary | ICD-10-CM | POA: Insufficient documentation

## 2014-02-02 DIAGNOSIS — Z3202 Encounter for pregnancy test, result negative: Secondary | ICD-10-CM | POA: Insufficient documentation

## 2014-02-02 DIAGNOSIS — Z9889 Other specified postprocedural states: Secondary | ICD-10-CM | POA: Diagnosis not present

## 2014-02-02 DIAGNOSIS — H5789 Other specified disorders of eye and adnexa: Secondary | ICD-10-CM | POA: Diagnosis not present

## 2014-02-02 DIAGNOSIS — R1110 Vomiting, unspecified: Secondary | ICD-10-CM | POA: Diagnosis not present

## 2014-02-02 DIAGNOSIS — Z87440 Personal history of urinary (tract) infections: Secondary | ICD-10-CM | POA: Insufficient documentation

## 2014-02-02 DIAGNOSIS — R51 Headache: Secondary | ICD-10-CM

## 2014-02-02 DIAGNOSIS — J45909 Unspecified asthma, uncomplicated: Secondary | ICD-10-CM | POA: Insufficient documentation

## 2014-02-02 DIAGNOSIS — Z87891 Personal history of nicotine dependence: Secondary | ICD-10-CM | POA: Insufficient documentation

## 2014-02-02 LAB — URINALYSIS, ROUTINE W REFLEX MICROSCOPIC
Bilirubin Urine: NEGATIVE
Glucose, UA: NEGATIVE mg/dL
Hgb urine dipstick: NEGATIVE
Ketones, ur: NEGATIVE mg/dL
Leukocytes, UA: NEGATIVE
Nitrite: NEGATIVE
Protein, ur: NEGATIVE mg/dL
Specific Gravity, Urine: 1.011 (ref 1.005–1.030)
Urobilinogen, UA: 0.2 mg/dL (ref 0.0–1.0)
pH: 8 (ref 5.0–8.0)

## 2014-02-02 LAB — CBC WITH DIFFERENTIAL/PLATELET
Basophils Absolute: 0 10*3/uL (ref 0.0–0.1)
Basophils Relative: 0 % (ref 0–1)
Eosinophils Absolute: 0.2 10*3/uL (ref 0.0–0.7)
Eosinophils Relative: 1 % (ref 0–5)
HCT: 34.5 % — ABNORMAL LOW (ref 36.0–46.0)
Hemoglobin: 11.6 g/dL — ABNORMAL LOW (ref 12.0–15.0)
Lymphocytes Relative: 20 % (ref 12–46)
Lymphs Abs: 3.6 10*3/uL (ref 0.7–4.0)
MCH: 29.7 pg (ref 26.0–34.0)
MCHC: 33.6 g/dL (ref 30.0–36.0)
MCV: 88.2 fL (ref 78.0–100.0)
Monocytes Absolute: 1 10*3/uL (ref 0.1–1.0)
Monocytes Relative: 6 % (ref 3–12)
Neutro Abs: 12.9 10*3/uL — ABNORMAL HIGH (ref 1.7–7.7)
Neutrophils Relative %: 73 % (ref 43–77)
Platelets: 276 10*3/uL (ref 150–400)
RBC: 3.91 MIL/uL (ref 3.87–5.11)
RDW: 14 % (ref 11.5–15.5)
WBC: 17.7 10*3/uL — ABNORMAL HIGH (ref 4.0–10.5)

## 2014-02-02 LAB — BASIC METABOLIC PANEL
Anion gap: 11 (ref 5–15)
BUN: 11 mg/dL (ref 6–23)
CO2: 29 mEq/L (ref 19–32)
Calcium: 8.8 mg/dL (ref 8.4–10.5)
Chloride: 96 mEq/L (ref 96–112)
Creatinine, Ser: 0.68 mg/dL (ref 0.50–1.10)
GFR calc Af Amer: >90 mL/min (ref >90)
GFR calc non Af Amer: >90 mL/min (ref >90)
Glucose, Bld: 90 mg/dL (ref 70–99)
Potassium: 4 mEq/L (ref 3.7–5.3)
Sodium: 136 mEq/L — ABNORMAL LOW (ref 137–147)

## 2014-02-02 LAB — PREGNANCY, URINE: Preg Test, Ur: NEGATIVE

## 2014-02-02 MED ORDER — ONDANSETRON HCL 4 MG/2ML IJ SOLN
4.0000 mg | Freq: Once | INTRAMUSCULAR | Status: AC
  Administered 2014-02-02: 4 mg via INTRAVENOUS
  Filled 2014-02-02: qty 2

## 2014-02-02 MED ORDER — DEXAMETHASONE SODIUM PHOSPHATE 10 MG/ML IJ SOLN
10.0000 mg | Freq: Once | INTRAMUSCULAR | Status: AC
  Administered 2014-02-02: 10 mg via INTRAVENOUS
  Filled 2014-02-02: qty 1

## 2014-02-02 MED ORDER — SODIUM CHLORIDE 0.9 % IV BOLUS (SEPSIS)
1000.0000 mL | Freq: Once | INTRAVENOUS | Status: AC
  Administered 2014-02-02: 1000 mL via INTRAVENOUS

## 2014-02-02 MED ORDER — DIPHENHYDRAMINE HCL 50 MG/ML IJ SOLN
25.0000 mg | Freq: Once | INTRAMUSCULAR | Status: AC
  Administered 2014-02-02: 25 mg via INTRAVENOUS
  Filled 2014-02-02: qty 1

## 2014-02-02 MED ORDER — METOCLOPRAMIDE HCL 5 MG/ML IJ SOLN
10.0000 mg | Freq: Once | INTRAMUSCULAR | Status: AC
  Administered 2014-02-02: 10 mg via INTRAVENOUS
  Filled 2014-02-02: qty 2

## 2014-02-02 MED ORDER — MORPHINE SULFATE 4 MG/ML IJ SOLN
4.0000 mg | Freq: Once | INTRAMUSCULAR | Status: AC
  Administered 2014-02-02: 4 mg via INTRAVENOUS
  Filled 2014-02-02: qty 1

## 2014-02-02 MED ORDER — BUTALBITAL-APAP-CAFFEINE 50-325-40 MG PO TABS
1.0000 | ORAL_TABLET | Freq: Once | ORAL | Status: DC
  Filled 2014-02-02: qty 1

## 2014-02-02 MED ORDER — KETOROLAC TROMETHAMINE 30 MG/ML IJ SOLN
30.0000 mg | Freq: Once | INTRAMUSCULAR | Status: AC
  Administered 2014-02-02: 30 mg via INTRAVENOUS
  Filled 2014-02-02: qty 1

## 2014-02-02 NOTE — ED Notes (Signed)
Per EMS: Pt was at restaurant today and had an increase in headache, pt recently had LP done at [facility] recently. Pt states pain starts at LP site and radiates to back and then to front of head. Pt has light and auditory sensitivity. Pt also reports n/v with one episode of emesis today. Pt axo x4, nad noted

## 2014-02-02 NOTE — ED Provider Notes (Signed)
CSN: [id number]     Arrival date & time 02/02/14  1808 History   First MD Initiated Contact with Patient 02/02/14 1825     Chief Complaint  Patient presents with  . Headache     (Consider location/radiation/quality/duration/timing/severity/associated sxs/prior Treatment) HPI Comments: The patient is a 28 year old female presenting to emergency room chief complaint of headache for 2 days. The patient reports receiving [facility]. Denies history of headaches. 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 She denies new visual changes today. She reports [facility] and on the last day the lumbar puncture was performed. She reports persistent headache since. Aggravating factors include light, sounds, position, eating, talking. Reports mild resolution of symptoms with lying flat. She reports she was discharged home with Fioricet and Dilaudid she reports taking 2 Fioricet prior to arrival. She reports that she vomited once today, denies nausea denies abdominal pain. Denies changes in vision in the right eye, eye redness persistent since injections. Denies numbness tingling. The patient reports since LP she has only light flat for approximately one hour, and time she reports taking Firocet but no other caffeine containing substance. She reports going to graduation today and propose al from her boyfriend today. Opthalmologic: [facility]  The history is provided by the patient. No language interpreter was used.    Past Medical History  Diagnosis Date  . UTI (urinary tract infection)   . Medical history non-contributory   . Asthma    Past Surgical History  Procedure Laterality Date  . Cystectomy    . Wisdom tooth extraction    . Pilonidal cyst excision    . Tubal ligation N/A 07/01/2013    Procedure: POST PARTUM TUBAL LIGATION;  Surgeon: Allie BossierMyra C Dove, MD;  Location: [facility];  Service: Gynecology;   Laterality: N/A;   Family History  Problem Relation Age of Onset  . Alcohol abuse Neg Hx   . Arthritis Neg Hx   . Asthma Neg Hx   . Birth defects Neg Hx   . Cancer Neg Hx   . COPD Neg Hx   . Depression Neg Hx   . Diabetes Neg Hx   . Drug abuse Neg Hx   . Early death Neg Hx   . Hearing loss Neg Hx   . Heart disease Neg Hx   . Hyperlipidemia Neg Hx   . Hypertension Neg Hx   . Kidney disease Neg Hx   . Learning disabilities Neg Hx   . Mental illness Neg Hx   . Mental retardation Neg Hx   . Miscarriages / Stillbirths Neg Hx   . Stroke Neg Hx   . Vision loss Neg Hx    History  Substance Use Topics  . Smoking status: Former Smoker -- 0.25 packs/day    Types: Cigarettes  . Smokeless tobacco: Never Used     Comment: 4 CIGS A DAY- Stopped smoking when she had positive upt  . Alcohol Use: No   OB History   Grav Para Term Preterm Abortions TAB SAB Ect Mult Living   4 3 3  1 1    3      Review of Systems  Constitutional: Negative for fever and chills.  Eyes: Positive for photophobia and redness. Negative for visual disturbance.  Gastrointestinal: Positive for vomiting. Negative for nausea and abdominal pain.  Genitourinary: Negative for dysuria and urgency.  Musculoskeletal: Negative  for neck pain and neck stiffness.  Neurological: Positive for headaches. Negative for light-headedness.      Allergies  Review of patient's allergies indicates no known allergies.  Home Medications   Prior to Admission medications   Medication Sig Start Date End Date Taking? Authorizing Provider  albuterol (PROVENTIL HFA;VENTOLIN HFA) 108 (90 BASE) MCG/ACT inhaler Inhale 2 puffs into the lungs every 6 (six) hours as needed for wheezing. 06/26/13  Yes Deirdre C Poe, CNM  butalbital-acetaminophen-caffeine (FIORICET, ESGIC) 50-325-40 MG per tablet Take 1 tablet by mouth 2 (two) times daily as needed for headache.   Yes Historical Provider, MD  HYDROmorphone (DILAUDID) 2 MG tablet Take 2 mg by  mouth every 4 (four) hours as needed for severe pain.   Yes Historical Provider, MD   BP 129/90  Pulse 75  Temp(Src) 98.9 F (37.2 C) (Oral)  Resp 16  SpO2 100% Physical Exam  Nursing note and vitals reviewed. Constitutional: She is oriented to person, place, and time. She appears well-developed and well-nourished.  Non-toxic appearance. She does not have a sickly appearance. She does not appear ill. No distress.  HENT:  Head: Normocephalic and atraumatic.  Eyes: EOM are normal. Pupils are equal, round, and reactive to light. Right eye exhibits no discharge. Left eye exhibits no discharge. Right conjunctiva is injected. No scleral icterus.  Neck: Normal range of motion. Neck supple.  Cardiovascular: Normal rate and regular rhythm.   No murmur heard. No lower extremity edema  Pulmonary/Chest: Effort normal and breath sounds normal. She has no wheezes. She has no rales. She exhibits no tenderness.  Abdominal: Soft. Bowel sounds are normal. She exhibits no distension. There is no tenderness. There is no rebound and no guarding.  Musculoskeletal: Normal range of motion. She exhibits no edema.  Neurological: She is alert and oriented to person, place, and time. No cranial nerve deficit or sensory deficit. She exhibits normal muscle tone. Coordination and gait normal. GCS eye subscore is 4. GCS verbal subscore is 5. GCS motor subscore is 6.  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 Speech is clear and goal oriented, follows commands Cranial nerves III - XII grossly intact, no facial droop Normal strength in upper and lower extremities bilaterally, strong and equal grip strength Sensation normal to light touch Normal gait without assistance Moves all 4 extremities without ataxia, coordination intact Normal finger to nose and rapid alternating movements No pronator drift   Skin: Skin is warm and dry. No rash noted. She is not diaphoretic.  Lumbar spine: 3 small puncture wounds midline with small surrounding  ecchymosis nontender to palpation no fluctuance or drainage noted.  Psychiatric: She has a normal mood and affect. Her behavior is normal. Thought content normal.    ED Course  Procedures (including critical care time) Labs Review Results for orders placed during the hospital encounter of 02/02/14  PREGNANCY, URINE      Result Value Ref Range   Preg Test, Ur NEGATIVE  NEGATIVE  CBC WITH DIFFERENTIAL      Result Value Ref Range   WBC 17.7 (*) 4.0 - 10.5 K/uL   RBC 3.91  3.87 - 5.11 MIL/uL   Hemoglobin 11.6 (*) 12.0 - 15.0 g/dL   HCT 18.2 (*) 99.3 - 71.6 %   MCV 88.2  78.0 - 100.0 fL   MCH 29.7  26.0 - 34.0 pg   MCHC 33.6  30.0 - 36.0 g/dL   RDW 96.7  89.3 - 81.0 %   Platelets 276  150 -  400 K/uL   Neutrophils Relative % 73  43 - 77 %   Neutro Abs 12.9 (*) 1.7 - 7.7 K/uL   Lymphocytes Relative 20  12 - 46 %   Lymphs Abs 3.6  0.7 - 4.0 K/uL   Monocytes Relative 6  3 - 12 %   Monocytes Absolute 1.0  0.1 - 1.0 K/uL   Eosinophils Relative 1  0 - 5 %   Eosinophils Absolute 0.2  0.0 - 0.7 K/uL   Basophils Relative 0  0 - 1 %   Basophils Absolute 0.0  0.0 - 0.1 K/uL  URINALYSIS, ROUTINE W REFLEX MICROSCOPIC      Result Value Ref Range   Color, Urine YELLOW  YELLOW   APPearance CLEAR  CLEAR   Specific Gravity, Urine 1.011  1.005 - 1.030   pH 8.0  5.0 - 8.0   Glucose, UA NEGATIVE  NEGATIVE mg/dL   Hgb urine dipstick NEGATIVE  NEGATIVE   Bilirubin Urine NEGATIVE  NEGATIVE   Ketones, ur NEGATIVE  NEGATIVE mg/dL   Protein, ur NEGATIVE  NEGATIVE mg/dL   Urobilinogen, UA 0.2  0.0 - 1.0 mg/dL   Nitrite NEGATIVE  NEGATIVE   Leukocytes, UA NEGATIVE  NEGATIVE  BASIC METABOLIC PANEL      Result Value Ref Range   Sodium 136 (*) 137 - 147 mEq/L   Potassium 4.0  3.7 - 5.3 mEq/L   Chloride 96  96 - 112 mEq/L   CO2 29  19 - 32 mEq/L   Glucose, Bld 90  70 - 99 mg/dL   BUN 11  6 - 23 mg/dL   Creatinine, Ser 5.03  0.50 - 1.10 mg/dL   Calcium 8.8  8.4 - 54.6 mg/dL   GFR calc non Af Amer >90   >90 mL/min   GFR calc Af Amer >90  >90 mL/min   Anion gap 11  5 - 15    Imaging Review No results found.   EKG Interpretation None      MDM   Final diagnoses:  Headache(784.0)  Lumbar puncture less than one week ago   Patient presents with headache ongoing for 2 days. Recent LP 2 days ago performed at [facility]. No meningismus signs on exam. No neurologic deficits on exam. Will treat with migraine cocktail and reevaluate. Questionable true LP headache, given onset of symptoms right after LP.  Appears well in the ED. The patient has not laid flat, increased fluids or caffeine since her LP. CBC shows leukocytosis 17.7, afebrile. BMP without concerning abnormalities.  2140 Reevaluation patient reports persistent discomfort as a 10 on a 10. Medications given. 2315 Reevaluation patient reports headache 6/10. Fioricet ordered. Reevaluation patient reports resolution of headache requesting to be discharged at this time. Discussed treatment plan with the patient. Return precautions given. Reports understanding and no other concerns at this time.  Patient is stable for discharge at this time.  Meds given in ED:  Medications  sodium chloride 0.9 % bolus 1,000 mL (not administered)  sodium chloride 0.9 % bolus 1,000 mL (1,000 mLs Intravenous New Bag/Given 02/02/14 1957)  ondansetron (ZOFRAN) injection 4 mg (4 mg Intravenous Given 02/02/14 1957)  ketorolac (TORADOL) 30 MG/ML injection 30 mg (30 mg Intravenous Given 02/02/14 1957)  metoCLOPramide (REGLAN) injection 10 mg (10 mg Intravenous Given 02/02/14 1957)  diphenhydrAMINE (BENADRYL) injection 25 mg (25 mg Intravenous Given 02/02/14 1957)    New Prescriptions   No medications on file        Lauren  Doretha Imus, PA-C 02/03/14 [phone]

## 2014-02-02 NOTE — ED Notes (Signed)
LP done at [facility] on Friday, since then has had headache.  Has been taking Dilaudid and Fioricet with no relief. 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  Vomited x 1 today. No nausea at this time.

## 2014-02-03 NOTE — Discharge Instructions (Signed)
Call for a follow up appointment with a Family or Primary Care Provider.  Call your neurologist and her providers at [facility] for further management of your headache. Return if Symptoms worsen.   Take medication as prescribed.  Lay flat for 24 hours only rising or sitting up when you need to urinate or use the restroom. Drink plenty of fluids and caffeine.

## 2014-02-04 NOTE — ED Provider Notes (Signed)
Medical screening examination/treatment/procedure(s) were performed by non-physician practitioner and as supervising physician I was immediately available for consultation/collaboration.   EKG Interpretation None        Layla Maw Ward, DO 02/04/14 0725

## 2014-02-05 ENCOUNTER — Encounter (HOSPITAL_COMMUNITY): Payer: Self-pay | Admitting: Emergency Medicine

## 2014-02-05 ENCOUNTER — Emergency Department (HOSPITAL_COMMUNITY)
Admission: EM | Admit: 2014-02-05 | Discharge: 2014-02-06 | Disposition: A | Discharge status: Home / Self Care | Payer: Medicaid Other | Attending: Emergency Medicine | Admitting: Emergency Medicine

## 2014-02-05 DIAGNOSIS — Z87891 Personal history of nicotine dependence: Secondary | ICD-10-CM | POA: Insufficient documentation

## 2014-02-05 DIAGNOSIS — G971 Other reaction to spinal and lumbar puncture: Secondary | ICD-10-CM

## 2014-02-05 DIAGNOSIS — Z79899 Other long term (current) drug therapy: Secondary | ICD-10-CM | POA: Diagnosis not present

## 2014-02-05 DIAGNOSIS — R51 Headache: Secondary | ICD-10-CM | POA: Diagnosis not present

## 2014-02-05 DIAGNOSIS — R110 Nausea: Secondary | ICD-10-CM | POA: Diagnosis not present

## 2014-02-05 DIAGNOSIS — Z87440 Personal history of urinary (tract) infections: Secondary | ICD-10-CM | POA: Insufficient documentation

## 2014-02-05 DIAGNOSIS — Z09 Encounter for follow-up examination after completed treatment for conditions other than malignant neoplasm: Secondary | ICD-10-CM | POA: Diagnosis present

## 2014-02-05 DIAGNOSIS — G8918 Other acute postprocedural pain: Secondary | ICD-10-CM | POA: Insufficient documentation

## 2014-02-05 DIAGNOSIS — J45909 Unspecified asthma, uncomplicated: Secondary | ICD-10-CM | POA: Insufficient documentation

## 2014-02-05 MED ORDER — METOCLOPRAMIDE HCL 10 MG PO TABS
10.0000 mg | ORAL_TABLET | Freq: Once | ORAL | Status: AC
  Administered 2014-02-05: 10 mg via ORAL
  Filled 2014-02-05: qty 1

## 2014-02-05 MED ORDER — OXYCODONE-ACETAMINOPHEN 5-325 MG PO TABS
2.0000 | ORAL_TABLET | Freq: Once | ORAL | Status: AC
  Administered 2014-02-05: 2 via ORAL
  Filled 2014-02-05: qty 2

## 2014-02-05 MED ORDER — KETOROLAC TROMETHAMINE 60 MG/2ML IM SOLN
60.0000 mg | Freq: Once | INTRAMUSCULAR | Status: AC
  Administered 2014-02-05: 60 mg via INTRAMUSCULAR
  Filled 2014-02-05: qty 2

## 2014-02-05 MED ORDER — DIPHENHYDRAMINE HCL 25 MG PO CAPS
25.0000 mg | ORAL_CAPSULE | Freq: Once | ORAL | Status: AC
  Administered 2014-02-05: 25 mg via ORAL
  Filled 2014-02-05: qty 1

## 2014-02-05 NOTE — ED Provider Notes (Signed)
CSN: [id number]     Arrival date & time 02/05/14  1754 History   First MD Initiated Contact with Patient 02/05/14 2303     Chief Complaint  Patient presents with  . Follow-up     (Consider location/radiation/quality/duration/timing/severity/associated sxs/prior Treatment) HPI In workup for visual changes patient received a lumbar puncture [facility] on 7/17. She states she began having a headache immediately after. Describes it as a throbbing headache both in the frontal and occipital region. She has nausea associated with it. There is no photophobia or vision changes. It is worse with sitting up and improved with lying flat. She has no focal weakness or numbness. Denies any neck pain or stiffness. She's been seen in emergency department 3 times since the LP. She is currently in no pain while lying flat. She was told to be she may need a blood patch. She currently states that her child is sick in the pediatric emergency Department and is requesting treatment and be discharged.  Past Medical History  Diagnosis Date  . UTI (urinary tract infection)   . Medical history non-contributory   . Asthma    Past Surgical History  Procedure Laterality Date  . Cystectomy    . Wisdom tooth extraction    . Pilonidal cyst excision    . Tubal ligation N/A 07/01/2013    Procedure: POST PARTUM TUBAL LIGATION;  Surgeon: Allie Bossier, MD;  Location: [facility];  Service: Gynecology;  Laterality: N/A;   Family History  Problem Relation Age of Onset  . Alcohol abuse Neg Hx   . Arthritis Neg Hx   . Asthma Neg Hx   . Birth defects Neg Hx   . Cancer Neg Hx   . COPD Neg Hx   . Depression Neg Hx   . Diabetes Neg Hx   . Drug abuse Neg Hx   . Early death Neg Hx   . Hearing loss Neg Hx   . Heart disease Neg Hx   . Hyperlipidemia Neg Hx   . Hypertension Neg Hx   . Kidney disease Neg Hx   . Learning disabilities Neg Hx   . Mental illness Neg Hx   . Mental retardation Neg Hx   . Miscarriages / Stillbirths Neg Hx    . Stroke Neg Hx   . Vision loss Neg Hx    History  Substance Use Topics  . Smoking status: Former Smoker -- 0.25 packs/day    Types: Cigarettes  . Smokeless tobacco: Never Used     Comment: 4 CIGS A DAY- Stopped smoking when she had positive upt  . Alcohol Use: No   OB History   Grav Para Term Preterm Abortions TAB SAB Ect Mult Living   4 3 3  1 1    3      Review of Systems  Constitutional: Negative for fever and chills.  Eyes: Negative for photophobia and visual disturbance.  Respiratory: Negative for cough and shortness of breath.   Gastrointestinal: Positive for nausea. Negative for abdominal pain.  Musculoskeletal: Negative for neck pain and neck stiffness.  Skin: Negative for rash and wound.  Neurological: Positive for headaches. Negative for dizziness, weakness, light-headedness and numbness.  All other systems reviewed and are negative.     Allergies  Review of patient's allergies indicates no known allergies.  Home Medications   Prior to Admission medications   Medication Sig Start Date End Date Taking? Authorizing Provider  albuterol (PROVENTIL HFA;VENTOLIN HFA) 108 (90 BASE) MCG/ACT inhaler Inhale 2  puffs into the lungs every 6 (six) hours as needed for wheezing. 06/26/13  Yes Deirdre C Poe, CNM  butalbital-acetaminophen-caffeine (FIORICET, ESGIC) 50-325-40 MG per tablet Take 1 tablet by mouth 2 (two) times daily as needed for headache.   Yes Historical Provider, MD  HYDROmorphone (DILAUDID) 2 MG tablet Take 2 mg by mouth every 4 (four) hours as needed for severe pain.   Yes Historical Provider, MD   BP 114/66  Pulse 68  Temp(Src) 98.9 F (37.2 C)  Resp 18  Ht 5\' 5"  (1.651 m)  Wt 200 lb (90.719 kg)  BMI 33.28 kg/m2  SpO2 100%  LMP 12/17/2013 Physical Exam  Nursing note and vitals reviewed. Constitutional: She is oriented to person, place, and time. She appears well-developed and well-nourished. No distress.  HENT:  Head: Normocephalic and atraumatic.   Mouth/Throat: Oropharynx is clear and moist.  Eyes: EOM are normal. Pupils are equal, round, and reactive to light.  Neck: Normal range of motion. Neck supple.  No meningismus  Cardiovascular: Normal rate and regular rhythm.   Pulmonary/Chest: Effort normal and breath sounds normal. No respiratory distress. She has no wheezes. She has no rales.  Abdominal: Soft. Bowel sounds are normal. She exhibits no distension and no mass. There is no tenderness. There is no rebound and no guarding.  Musculoskeletal: Normal range of motion. She exhibits no edema and no tenderness.  No evidence of infection or tenderness at site of LP.  Neurological: She is alert and oriented to person, place, and time.  Patient is alert and oriented x3 with clear, goal oriented speech. Patient has 5/5 motor in all extremities. Sensation is intact to light touch. Bilateral finger-to-nose is normal with no signs of dysmetria. Patient has a normal gait and walks without assistance.   Skin: Skin is warm and dry. No rash noted. No erythema.  Psychiatric: She has a normal mood and affect. Her behavior is normal.    ED Course  Procedures (including critical care time) Labs Review Labs Reviewed - No data to display  Imaging Review No results found.   EKG Interpretation None      MDM   Final diagnoses:  None    We'll treat the emergency department and discharge. Neurologic exam is normal. She states she will followup with radiology for possible blood patch.   02/16/2014, MD 02/06/14 [phone]

## 2014-02-05 NOTE — ED Notes (Signed)
Pt in stating she had a lumbar puncture at [facility] on 7/17, states she has had a severe headache since that time and was told to come here for a blood patch.

## 2014-02-06 ENCOUNTER — Other Ambulatory Visit: Payer: Self-pay | Admitting: Emergency Medicine

## 2014-02-06 ENCOUNTER — Telehealth (HOSPITAL_COMMUNITY): Payer: Self-pay

## 2014-02-06 DIAGNOSIS — G971 Other reaction to spinal and lumbar puncture: Secondary | ICD-10-CM

## 2014-02-06 NOTE — Discharge Instructions (Signed)
Call the radiologist's to arrange blood patch for persistent headache. Spinal Headache A spinal headache is a severe headache that can happen after getting a spinal tap, also called lumbar puncture, or an epidural anesthetic. Both of these procedures involve passing a needle through ligaments that run along the back side of your spinal column and into one of the spaces just above your spinal cord. Sometimes spinal fluid leaks through the temporary hole left by the needle. This leak causes a decrease in spinal fluid pressure, which leads to a spinal headache. The headache usually begins within hours or 1-2 days after the procedure, and it lasts until adequate pressure returns as your body creates more spinal fluid. The headache can last a few days and rarely lasts for more than 1 week. SIGNS AND SYMPTOMS   Severe headache pain when sitting or standing.  Decreased headache pain when lying down.  Neck pain, especially when flexing the neck in a chin-to-chest position.  Vomiting. DIAGNOSIS  Diagnosis of spinal headache is usually made based on your recent medical history. Your health care provider will consider the timing of a recent spinal tap or epidural anesthetic, along with how soon your headache occurred afterward. On rare occasions, tests may be done to confirm the diagnosis, such as an MRI. TREATMENT  Treatment may include:  Drinking extra fluids to improve your level of hydration. This will help your body replace the spinal fluid that has leaked out through the needle hole. Receiving IV fluids may be necessary.  Taking pain medicine as prescribed by your health care provider.  Drinking caffeinated beverages such as soda, coffee, or tea. Caffeine may help to shrink the blood vessels in your brain, which may reduce your headache pain.  Lying flat for a few days.  Having a blood patch procedure, which involves injecting a small amount of your blood at the puncture site to seal the  leak. HOME CARE INSTRUCTIONS  Lie down to relieve pain if your pain gets worse when you sit or stand.  Drink enough fluids to keep your urine clear or pale yellow.  Take pain medicine as directed by your health care provider. SEEK IMMEDIATE MEDICAL CARE IF:   Your pain becomes very severe or cannot be controlled.  You develop a fever.  You have a stiff neck.  You lose bowel or bladder control.  You have trouble walking. MAKE SURE YOU:  Understand these instructions.  Will watch your condition.   Will get help right away if you are not doing well or get worse. Document Released: 12/24/2001 Document Revised: 07/09/2013 Document Reviewed: 01/24/2013 [facility] Patient Information 2015 [place], [place]. This information is not intended to replace advice given to you by your health care provider. Make sure you discuss any questions you have with your health care provider.  Epidural Blood Patch for Spinal Headache An epidural blood patch is a procedure that is used to treat a headache that occurs when there is a leak of spinal fluid. This type of headache is called a spinal headache or post-dural puncture headache. Spinal headaches sometimes occur after a person undergoes a type of anesthesia called epidural anesthesia or after a lumbar puncture (also called a spinal tap).  Generally, an epidural blood patch is done when a spinal headache has not been relieved by 2-3 days of:   Bed rest.   Drinking lots of fluids.   Taking oral medicines for pain, including nonsteroidal anti-inflammatory agents and caffeine. It may also be done to treat  a person who has had epidural anesthesia and is experiencing:   Neck pain and stiffness that are very severe and associated with vomiting.   Hearing loss.   Double vision.  An epidural blood patch is not done when:   Your headache is due to an infection in the lower back (lumbar) area or the blood.   You have bleeding tendencies.    You are taking certain blood-thinning medicines. LET [facility] CARE PROVIDER KNOW ABOUT:  Any allergies you have.   All medicines you are taking, including vitamins, herbs, eye drops, creams, and over-the-counter medicines.   Previous problems you or members of your family have had with the use of anesthetics.   Any blood disorders you have.   Previous surgeries you have had.   Medical conditions you have.  RISKS AND COMPLICATIONS Generally, an epidural blood patch is a safe procedure. However, as with any procedure, complications can occur. Possible complications include:   Backache.  Nerve pain, tingling, or numbness.  Bleeding.  Infection. Complications are more likely to occur in people who have bleeding disorders or infections. BEFORE THE PROCEDURE  Drink a lot of water the day before your procedure.  Make sure your health care provider knows about all medicines and dietary or herbal supplements that you are taking. Take them as directed and find out if you need to stop any of them prior to the procedure.  Follow your health care provider's instructions for when to stop eating and drinking.  Arrange for someone to drive you to and from the procedure. PROCEDURE   You will have two IV lines placed--one to give you fluids and medicines during the procedure and one to withdraw blood for the patch.  You will lie on your stomach.  An X-ray machine will take pictures of your back to locate the area of leakage.  Dye may be injected so that the area can be seen well on an X-ray.  Blood will be drawn from your arm and injected into the leaking area. When the blood is injected, you may feel tightness in your buttocks, lower back, or thighs. AFTER THE PROCEDURE  You will be expected to lie on your back for 2-4 hours with some pillows under your knees. It is important to lie still while on your back so that a good clot can form. You should also avoid any straining,  especially right after the procedure.  Most people obtain almost instant relief from the spinal headache. In some, the relief comes on gradually over a 24-hour period. Some people experience mild backaches for a few days. In a few cases, people also have a mild, passing sensation of prickly or tingly skin (paresthesia), neck pain, or nerve-root pain. Document Released: 12/24/2001 Document Revised: 04/24/2013 Document Reviewed: 02/13/2013 [facility] Patient Information 2015 [place], [place]. This information is not intended to replace advice given to you by your health care provider. Make sure you discuss any questions you have with your health care provider.

## 2014-02-06 NOTE — ED Notes (Signed)
Dr Judd Lien put in order for blood patch. Stacy [name] at [facility] imaging is now scheduling procedure

## 2014-02-06 NOTE — ED Provider Notes (Signed)
I was called by the flow manager and was told this patient is to have a blood patch performed. However there was no order placed by Dr. Ranae Palms. I reviewed Dr. Wonda Amis note and it appears as though this is what he had intended. I have placed the order for this procedure in Dr. Wonda Amis absence.  Geoffery Lyons, MD 02/06/14 1250

## 2014-02-07 ENCOUNTER — Ambulatory Visit
Admission: RE | Admit: 2014-02-07 | Discharge: 2014-02-07 | Disposition: A | Discharge status: Home / Self Care | Payer: Medicaid Other | Source: Ambulatory Visit | Attending: Emergency Medicine | Admitting: Emergency Medicine

## 2014-02-07 VITALS — BP 140/89 | HR 74

## 2014-02-07 DIAGNOSIS — G971 Other reaction to spinal and lumbar puncture: Secondary | ICD-10-CM

## 2014-02-07 MED ORDER — IOHEXOL 180 MG/ML  SOLN
1.0000 mL | Freq: Once | INTRAMUSCULAR | Status: AC | PRN
  Administered 2014-02-07: 1 mL via EPIDURAL

## 2014-02-07 MED ORDER — IOHEXOL 180 MG/ML  SOLN: 1.0000 mL | Freq: Once | INTRAMUSCULAR | Status: AC | PRN

## 2014-02-07 NOTE — Discharge Instructions (Addendum)
Lumbar Puncture Discharge Instructions  1. Go home and rest quietly for the next 24 hours.  It is important to lie flat for the next 24 hours.  Get up only to go to the restroom.  You may lie in the bed or on a couch on your back, your stomach, your left side or your right side.  You may have one pillow under your head.  You may have pillows between your knees while you are on your side or under your knees while you are on your back.  2. DO NOT drive today.  Recline the seat as far back as it will go, while still wearing your seat belt, on the way home.  3. You may get up to go to the bathroom as needed.  You may sit up for 10 minutes to eat.  You may resume your normal diet and medications unless otherwise indicated.  Drink plenty of extra fluids today and tomorrow.  Caffeine may help your body make spinal fluid faster, so add or increase your caffeine intake as much as possible.    4. You may resume normal activities after your 24 hours of bed rest is over; however, do not exert yourself strongly or do any heavy lifting tomorrow.

## 2014-04-23 ENCOUNTER — Ambulatory Visit (INDEPENDENT_AMBULATORY_CARE_PROVIDER_SITE_OTHER): Payer: Medicaid Other | Admitting: General Practice

## 2014-04-23 VITALS — BP 133/88 | HR 108 | Temp 98.7°F | Ht 65.0 in | Wt 210.3 lb

## 2014-04-23 DIAGNOSIS — Z23 Encounter for immunization: Secondary | ICD-10-CM

## 2014-04-23 NOTE — Progress Notes (Signed)
Patient would like hep B shot and refill on ambien while here today. Told patient that we will be unable to give her the hep B today but provided her with community health & wellness number and address as patient does not have PCP and that they will be able to refill her Remus Loffler if they would like or give her something different. Patient verbalized understanding to all and had no questions

## 2014-05-19 ENCOUNTER — Encounter (HOSPITAL_COMMUNITY): Payer: Self-pay | Admitting: Emergency Medicine

## 2015-05-29 ENCOUNTER — Other Ambulatory Visit: Payer: Self-pay | Admitting: Ophthalmology

## 2015-05-29 DIAGNOSIS — H209 Unspecified iridocyclitis: Secondary | ICD-10-CM

## 2015-06-30 ENCOUNTER — Emergency Department (HOSPITAL_COMMUNITY)
Admission: EM | Admit: 2015-06-30 | Discharge: 2015-06-30 | Disposition: A | Discharge status: Home / Self Care | Payer: Medicaid Other | Attending: Emergency Medicine | Admitting: Emergency Medicine

## 2015-06-30 ENCOUNTER — Encounter (HOSPITAL_COMMUNITY): Payer: Self-pay | Admitting: Neurology

## 2015-06-30 ENCOUNTER — Emergency Department (HOSPITAL_COMMUNITY): Payer: Medicaid Other

## 2015-06-30 DIAGNOSIS — Z79899 Other long term (current) drug therapy: Secondary | ICD-10-CM | POA: Insufficient documentation

## 2015-06-30 DIAGNOSIS — Z906 Acquired absence of other parts of urinary tract: Secondary | ICD-10-CM | POA: Insufficient documentation

## 2015-06-30 DIAGNOSIS — Z7952 Long term (current) use of systemic steroids: Secondary | ICD-10-CM | POA: Diagnosis not present

## 2015-06-30 DIAGNOSIS — N83202 Unspecified ovarian cyst, left side: Secondary | ICD-10-CM | POA: Diagnosis not present

## 2015-06-30 DIAGNOSIS — R079 Chest pain, unspecified: Secondary | ICD-10-CM | POA: Insufficient documentation

## 2015-06-30 DIAGNOSIS — R112 Nausea with vomiting, unspecified: Secondary | ICD-10-CM | POA: Diagnosis not present

## 2015-06-30 DIAGNOSIS — F17210 Nicotine dependence, cigarettes, uncomplicated: Secondary | ICD-10-CM | POA: Insufficient documentation

## 2015-06-30 DIAGNOSIS — Z3202 Encounter for pregnancy test, result negative: Secondary | ICD-10-CM | POA: Diagnosis not present

## 2015-06-30 DIAGNOSIS — Z87440 Personal history of urinary (tract) infections: Secondary | ICD-10-CM | POA: Insufficient documentation

## 2015-06-30 DIAGNOSIS — J45909 Unspecified asthma, uncomplicated: Secondary | ICD-10-CM | POA: Diagnosis not present

## 2015-06-30 DIAGNOSIS — N83201 Unspecified ovarian cyst, right side: Secondary | ICD-10-CM | POA: Diagnosis not present

## 2015-06-30 DIAGNOSIS — R1031 Right lower quadrant pain: Secondary | ICD-10-CM | POA: Diagnosis present

## 2015-06-30 LAB — CBC
HCT: 41.4 % (ref 36.0–46.0)
Hemoglobin: 13.5 g/dL (ref 12.0–15.0)
MCH: 29.2 pg (ref 26.0–34.0)
MCHC: 32.6 g/dL (ref 30.0–36.0)
MCV: 89.6 fL (ref 78.0–100.0)
Platelets: 296 10*3/uL (ref 150–400)
RBC: 4.62 MIL/uL (ref 3.87–5.11)
RDW: 14.5 % (ref 11.5–15.5)
WBC: 21.1 10*3/uL — ABNORMAL HIGH (ref 4.0–10.5)

## 2015-06-30 LAB — URINE MICROSCOPIC-ADD ON

## 2015-06-30 LAB — COMPREHENSIVE METABOLIC PANEL
ALT: 32 U/L (ref 14–54)
AST: 19 U/L (ref 15–41)
Albumin: 3.9 g/dL (ref 3.5–5.0)
Alkaline Phosphatase: 82 U/L (ref 38–126)
Anion gap: 11 (ref 5–15)
BUN: 8 mg/dL (ref 6–20)
CO2: 24 mmol/L (ref 22–32)
Calcium: 9.6 mg/dL (ref 8.9–10.3)
Chloride: 103 mmol/L (ref 101–111)
Creatinine, Ser: 0.8 mg/dL (ref 0.44–1.00)
GFR calc Af Amer: >60 mL/min (ref >60)
GFR calc non Af Amer: >60 mL/min (ref >60)
Glucose, Bld: 127 mg/dL — ABNORMAL HIGH (ref 65–99)
Potassium: 3.7 mmol/L (ref 3.5–5.1)
Sodium: 138 mmol/L (ref 135–145)
Total Bilirubin: 0.8 mg/dL (ref 0.3–1.2)
Total Protein: 7.4 g/dL (ref 6.5–8.1)

## 2015-06-30 LAB — I-STAT TROPONIN, ED: Troponin i, poc: 0 ng/mL (ref 0.00–0.08)

## 2015-06-30 LAB — I-STAT BETA HCG BLOOD, ED (MC, WL, AP ONLY): I-stat hCG, quantitative: <5 m[IU]/mL (ref <5)

## 2015-06-30 LAB — URINALYSIS, ROUTINE W REFLEX MICROSCOPIC
Glucose, UA: NEGATIVE mg/dL
Ketones, ur: NEGATIVE mg/dL
Leukocytes, UA: NEGATIVE
Nitrite: NEGATIVE
Protein, ur: 30 mg/dL — AB
Specific Gravity, Urine: 1.043 — ABNORMAL HIGH (ref 1.005–1.030)
pH: 5.5 (ref 5.0–8.0)

## 2015-06-30 LAB — LIPASE, BLOOD: Lipase: 27 U/L (ref 11–51)

## 2015-06-30 MED ORDER — ONDANSETRON HCL 4 MG/2ML IJ SOLN
4.0000 mg | Freq: Once | INTRAMUSCULAR | Status: AC
  Administered 2015-06-30: 4 mg via INTRAVENOUS
  Filled 2015-06-30: qty 2

## 2015-06-30 MED ORDER — HYDROCODONE-ACETAMINOPHEN 5-325 MG PO TABS: 1.0000 | ORAL_TABLET | ORAL | Status: DC | PRN

## 2015-06-30 MED ORDER — ONDANSETRON HCL 4 MG PO TABS: 4.0000 mg | ORAL_TABLET | Freq: Four times a day (QID) | ORAL | Status: DC

## 2015-06-30 MED ORDER — MORPHINE SULFATE (PF) 4 MG/ML IV SOLN
4.0000 mg | Freq: Once | INTRAVENOUS | Status: AC
  Administered 2015-06-30: 4 mg via INTRAVENOUS
  Filled 2015-06-30: qty 1

## 2015-06-30 MED ORDER — IOHEXOL 300 MG/ML  SOLN
100.0000 mL | Freq: Once | INTRAMUSCULAR | Status: AC | PRN
  Administered 2015-06-30: 100 mL via INTRAVENOUS

## 2015-06-30 MED ORDER — SODIUM CHLORIDE 0.9 % IV BOLUS (SEPSIS)
1000.0000 mL | Freq: Once | INTRAVENOUS | Status: AC
  Administered 2015-06-30: 1000 mL via INTRAVENOUS

## 2015-06-30 NOTE — ED Provider Notes (Signed)
CSN: [id number]     Arrival date & time 06/30/15  1414 History   First MD Initiated Contact with Patient 06/30/15 1703     Chief Complaint  Patient presents with  . Nausea  . Emesis  . Chest Pain   HPI   Stacy [name] is a 29 y.o. F PMH significant for asthma, UTIs presenting with a 2 week history of nausea and vomiting. She endorses decreased PO intake, CP (constant, pressure, 9/10 pain scale, non-radiating, substernal in location) and RLQ pain (intermittent, non-radiating, pressure, 9/10 pain scale). She denies fevers, chills, SOB, vaginal discharge/odor/itching.  Patient currently on menstrual cycle.   Past Medical History  Diagnosis Date  . UTI (urinary tract infection)   . Medical history non-contributory   . Asthma    Past Surgical History  Procedure Laterality Date  . Cystectomy    . Wisdom tooth extraction    . Pilonidal cyst excision    . Tubal ligation N/A 07/01/2013    Procedure: POST PARTUM TUBAL LIGATION;  Surgeon: Allie Bossier, MD;  Location: [facility];  Service: Gynecology;  Laterality: N/A;   Family History  Problem Relation Age of Onset  . Alcohol abuse Neg Hx   . Arthritis Neg Hx   . Asthma Neg Hx   . Birth defects Neg Hx   . Cancer Neg Hx   . COPD Neg Hx   . Depression Neg Hx   . Diabetes Neg Hx   . Drug abuse Neg Hx   . Early death Neg Hx   . Hearing loss Neg Hx   . Heart disease Neg Hx   . Hyperlipidemia Neg Hx   . Hypertension Neg Hx   . Kidney disease Neg Hx   . Learning disabilities Neg Hx   . Mental illness Neg Hx   . Mental retardation Neg Hx   . Miscarriages / Stillbirths Neg Hx   . Stroke Neg Hx   . Vision loss Neg Hx    Social History  Substance Use Topics  . Smoking status: Current Every Day Smoker -- 0.25 packs/day    Types: Cigarettes  . Smokeless tobacco: Never Used     Comment: 4 CIGS A DAY- Stopped smoking when she had positive upt  . Alcohol Use: No   OB History    Gravida Para Term Preterm AB TAB SAB Ectopic Multiple  Living   4 3 3  1 1    3      Review of Systems  Ten systems are reviewed and are negative for acute change except as noted in the HPI  Allergies  Celebrex  Home Medications   Prior to Admission medications   Medication Sig Start Date End Date Taking? Authorizing Provider  albuterol (PROVENTIL HFA;VENTOLIN HFA) 108 (90 BASE) MCG/ACT inhaler Inhale 2 puffs into the lungs every 6 (six) hours as needed for wheezing. 06/26/13  Yes Deirdre C Poe, CNM  amitriptyline (ELAVIL) 25 MG tablet Take 25 mg by mouth at bedtime.   Yes Historical Provider, MD  mycophenolate (CELLCEPT) 500 MG tablet Take 1,000 mg by mouth 2 (two) times daily.   Yes Historical Provider, MD  omeprazole (PRILOSEC) 20 MG capsule Take 20 mg by mouth daily.   Yes Historical Provider, MD  predniSONE (DELTASONE) 20 MG tablet Take 20 mg by mouth 2 (two) times daily with a meal.   Yes Historical Provider, MD  HYDROcodone-acetaminophen (NORCO/VICODIN) 5-325 MG tablet Take 1-2 tablets by mouth every 4 (four) hours as needed.  06/30/15   Melton Krebs, PA-C  ondansetron (ZOFRAN) 4 MG tablet Take 1 tablet (4 mg total) by mouth every 6 (six) hours. 06/30/15   Melton Krebs, PA-C   BP 102/87 mmHg  Pulse 87  Temp(Src) 98.6 F (37 C) (Oral)  Resp 10  SpO2 100%  LMP 06/07/2015 Physical Exam  Constitutional: She appears well-developed and well-nourished. No distress.  HENT:  Head: Normocephalic and atraumatic.  Mouth/Throat: Oropharynx is clear and moist. No oropharyngeal exudate.  Eyes: Conjunctivae are normal. Pupils are equal, round, and reactive to light. Right eye exhibits no discharge. Left eye exhibits no discharge. No scleral icterus.  Neck: No tracheal deviation present.  Cardiovascular: Normal rate, regular rhythm, normal heart sounds and intact distal pulses.  Exam reveals no gallop and no friction rub.   No murmur heard. Pulmonary/Chest: Effort normal and breath sounds normal. No respiratory distress. She  has no wheezes. She has no rales. She exhibits no tenderness.  Abdominal: Soft. Bowel sounds are normal. She exhibits no distension and no mass. There is tenderness. There is rebound. There is no guarding.  Positive McBurney's, Rosving, Psoas  Musculoskeletal: She exhibits no edema.  Lymphadenopathy:    She has no cervical adenopathy.  Neurological: She is alert. Coordination normal.  Skin: Skin is warm and dry. No rash noted. She is not diaphoretic. No erythema.  Psychiatric: She has a normal mood and affect. Her behavior is normal.  Nursing note and vitals reviewed.   ED Course  Procedures  Labs Review Labs Reviewed  COMPREHENSIVE METABOLIC PANEL - Abnormal; Notable for the following:    Glucose, Bld 127 (*)    All other components within normal limits  CBC - Abnormal; Notable for the following:    WBC 21.1 (*)    All other components within normal limits  URINALYSIS, ROUTINE W REFLEX MICROSCOPIC (NOT AT [facility]) - Abnormal; Notable for the following:    Color, Urine AMBER (*)    APPearance HAZY (*)    Specific Gravity, Urine 1.043 (*)    Hgb urine dipstick MODERATE (*)    Bilirubin Urine SMALL (*)    Protein, ur 30 (*)    All other components within normal limits  URINE MICROSCOPIC-ADD ON - Abnormal; Notable for the following:    Squamous Epithelial / LPF 6-30 (*)    Bacteria, UA FEW (*)    Casts HYALINE CASTS (*)    All other components within normal limits  LIPASE, BLOOD  I-STAT BETA HCG BLOOD, ED ([facility], [facility], [facility] ONLY)  I-STAT TROPOININ, ED    Imaging Review Dg Chest 2 View  06/30/2015  CLINICAL DATA:  Nausea and vomiting for 2 weeks, chest pain for 2 days, cholecystectomy July 2016, smoker, asthma EXAM: CHEST  2 VIEW COMPARISON:  01/20/2014 FINDINGS: Normal heart size, mediastinal contours, and pulmonary vascularity. Lungs clear. No pneumothorax. Bones unremarkable. IMPRESSION: Normal exam. Electronically Signed   By: Ulyses Southward M.D.   On: 06/30/2015 15:34   Ct Abdomen  Pelvis W Contrast  06/30/2015  CLINICAL DATA:  Right lower quadrant pain for 3 days EXAM: CT ABDOMEN AND PELVIS WITH CONTRAST TECHNIQUE: Multidetector CT imaging of the abdomen and pelvis was performed using the standard protocol following bolus administration of intravenous contrast. CONTRAST:  OMNIPAQUE IOHEXOL 300 MG/ML  SOLN COMPARISON:  None. FINDINGS: The lung bases are free of acute infiltrate or sizable effusion. The gallbladder has been surgically removed. The liver, spleen, adrenal glands and pancreas are within normal limits.  The kidneys show a normal enhancement pattern. No renal calculi or obstructive changes are noted. The appendix is well visualized and within normal limits. No significant inflammatory changes are seen. Bladder is well distended. The uterus is within normal limits. Changes of prior tubal ligation are seen. Cystic changes are noted in the ovaries bilaterally. No free pelvic fluid is seen. No acute bony abnormality is noted. IMPRESSION: No acute abnormality noted. Bilateral ovarian cysts. Electronically Signed   By: Alcide Clever M.D.   On: 06/30/2015 19:45   I have personally reviewed and evaluated these images and lab results as part of my medical decision-making.   EKG Interpretation   Date/Time:  Tuesday June 30 2015 14:17:59 EST Ventricular Rate:  111 PR Interval:  126 QRS Duration: 88 QT Interval:  336 QTC Calculation: 456 R Axis:   66 Text Interpretation:  Sinus tachycardia T wave abnormality, consider  inferior ischemia Abnormal ECG ED PHYSICIAN INTERPRETATION AVAILABLE IN  [facility] Confirmed by TEST, Record ([id number]) on 07/01/2015 6:47:09 AM      MDM   Final diagnoses:  Cysts of both ovaries  Nausea and vomiting, vomiting of unspecified type   Patient non-toxic appearing and VSS. Based on patient history and physical exam, most likely etiologies include GERD, MSK, appendicitis, ovarian cyst. Less likely etiologies include ACS,  Prinzmetal's/cocaine-induced angina, pericarditis/pericardial effusion, cardiac tamponade, constrictive pericarditis, myocarditis, aortic dissection, thoracic aortic aneurysm, CHF/acute pulmonary edema, pneumonia, pleuritis, pneumothorax, tension pneumothorax, pulmonary embolism, pulmonary HTN,  esophageal spasm, Mallory-Weiss tear, Boerhaave syndrome, peptic ulcer diease, biliary disease, pancreatitis, osteoarthritis/radiculopathy, herpes zoster, anxiety, sickle cell chest crisis.  CMP, UA, lipase, hcg, troponin unremarkable. CBC with leukocytosis of 21.  With RLQ tenderness and leukocytosis, will CT patient.  CT reveals BL ovarian cysts. This is most likely cause of pain in RLQ.  Discussed case with Dr. Jodi Mourning. Patient may be safely discharged home. Discussed reasons for return. Patient to follow-up with primary care provider within one week and gynecology as needed for ovarian cysts. Patient in understanding and agreement with the plan.  Melton Krebs, PA-C 07/02/15 2249  Blane Ohara, MD 07/04/15 [phone]

## 2015-06-30 NOTE — ED Notes (Signed)
Pt requesting something stronger for pain. RN informed

## 2015-06-30 NOTE — Discharge Instructions (Signed)
Stacy [name],  Nice meeting you! Please follow-up with your primary care provider regarding your nausea and vomiting. Return to the emergency department if develop fevers, chills, increased nausea/vomiting. Feel better soon!  S. Lane Hacker, PA-C   Nausea and Vomiting Nausea is a sick feeling that often comes before throwing up (vomiting). Vomiting is a reflex where stomach contents come out of your mouth. Vomiting can cause severe loss of body fluids (dehydration). Children and elderly adults can become dehydrated quickly, especially if they also have diarrhea. Nausea and vomiting are symptoms of a condition or disease. It is important to find the cause of your symptoms. CAUSES   Direct irritation of the stomach lining. This irritation can result from increased acid production (gastroesophageal reflux disease), infection, food poisoning, taking certain medicines (such as nonsteroidal anti-inflammatory drugs), alcohol use, or tobacco use.  Signals from the brain.These signals could be caused by a headache, heat exposure, an inner ear disturbance, increased pressure in the brain from injury, infection, a tumor, or a concussion, pain, emotional stimulus, or metabolic problems.  An obstruction in the gastrointestinal tract (bowel obstruction).  Illnesses such as diabetes, hepatitis, gallbladder problems, appendicitis, kidney problems, cancer, sepsis, atypical symptoms of a heart attack, or eating disorders.  Medical treatments such as chemotherapy and radiation.  Receiving medicine that makes you sleep (general anesthetic) during surgery. DIAGNOSIS Your caregiver may ask for tests to be done if the problems do not improve after a few days. Tests may also be done if symptoms are severe or if the reason for the nausea and vomiting is not clear. Tests may include:  Urine tests.  Blood tests.  Stool tests.  Cultures (to look for evidence of infection).  X-rays or other imaging  studies. Test results can help your caregiver make decisions about treatment or the need for additional tests. TREATMENT You need to stay well hydrated. Drink frequently but in small amounts.You may wish to drink water, sports drinks, clear broth, or eat frozen ice pops or gelatin dessert to help stay hydrated.When you eat, eating slowly may help prevent nausea.There are also some antinausea medicines that may help prevent nausea. HOME CARE INSTRUCTIONS   Take all medicine as directed by your caregiver.  If you do not have an appetite, do not force yourself to eat. However, you must continue to drink fluids.  If you have an appetite, eat a normal diet unless your caregiver tells you differently.  Eat a variety of complex carbohydrates (rice, wheat, potatoes, bread), lean meats, yogurt, fruits, and vegetables.  Avoid high-fat foods because they are more difficult to digest.  Drink enough water and fluids to keep your urine clear or pale yellow.  If you are dehydrated, ask your caregiver for specific rehydration instructions. Signs of dehydration may include:  Severe thirst.  Dry lips and mouth.  Dizziness.  Dark urine.  Decreasing urine frequency and amount.  Confusion.  Rapid breathing or pulse. SEEK IMMEDIATE MEDICAL CARE IF:   You have blood or brown flecks (like coffee grounds) in your vomit.  You have black or bloody stools.  You have a severe headache or stiff neck.  You are confused.  You have severe abdominal pain.  You have chest pain or trouble breathing.  You do not urinate at least once every 8 hours.  You develop cold or clammy skin.  You continue to vomit for longer than 24 to 48 hours.  You have a fever. MAKE SURE YOU:   Understand these  instructions.  Will watch your condition.  Will get help right away if you are not doing well or get worse.   This information is not intended to replace advice given to you by your health care provider.  Make sure you discuss any questions you have with your health care provider.   Document Released: 07/04/2005 Document Revised: 09/26/2011 Document Reviewed: 12/01/2010 Elsevier Interactive Patient Education [employer]

## 2015-06-30 NOTE — ED Notes (Signed)
Pt reports n/v for 2 weeks has been to GI doctor and unable to determine cause. 2 days ago developed cp. Reports center cp that is constant.

## 2015-06-30 NOTE — ED Notes (Signed)
PA at bedside.

## 2015-07-03 ENCOUNTER — Ambulatory Visit (INDEPENDENT_AMBULATORY_CARE_PROVIDER_SITE_OTHER): Payer: Medicaid Other | Admitting: Obstetrics

## 2015-07-03 ENCOUNTER — Encounter: Payer: Self-pay | Admitting: Obstetrics

## 2015-07-03 VITALS — BP 135/80 | HR 112 | Wt 229.0 lb

## 2015-07-03 DIAGNOSIS — K589 Irritable bowel syndrome without diarrhea: Secondary | ICD-10-CM

## 2015-07-03 DIAGNOSIS — Z01419 Encounter for gynecological examination (general) (routine) without abnormal findings: Secondary | ICD-10-CM | POA: Diagnosis not present

## 2015-07-03 DIAGNOSIS — Z0000 Encounter for general adult medical examination without abnormal findings: Secondary | ICD-10-CM

## 2015-07-03 DIAGNOSIS — N939 Abnormal uterine and vaginal bleeding, unspecified: Secondary | ICD-10-CM

## 2015-07-03 DIAGNOSIS — N946 Dysmenorrhea, unspecified: Secondary | ICD-10-CM

## 2015-07-03 MED ORDER — HYDROCODONE-IBUPROFEN 7.5-200 MG PO TABS: 1.0000 | ORAL_TABLET | Freq: Four times a day (QID) | ORAL | Status: DC | PRN

## 2015-07-03 MED ORDER — DICYCLOMINE HCL 20 MG PO TABS: 20.0000 mg | ORAL_TABLET | Freq: Three times a day (TID) | ORAL | Status: DC

## 2015-07-03 NOTE — Progress Notes (Signed)
Subjective:        Stacy [name] is a 29 y.o. female here for a routine exam.  Current complaints: Heavy and prolonged periods..    Personal health questionnaire:  Is patient Ashkenazi Jewish, have a family history of breast and/or ovarian cancer: no Is there a family history of uterine cancer diagnosed at age < 19, gastrointestinal cancer, urinary tract cancer, family member who is a Personnel officer syndrome-associated carrier: no Is the patient overweight and hypertensive, family history of diabetes, personal history of gestational diabetes, preeclampsia or PCOS: no Is patient over 17, have PCOS,  family history of premature CHD under age 91, diabetes, smoke, have hypertension or peripheral artery disease:  no At any time, has a partner hit, kicked or otherwise hurt or frightened you?: no Over the past 2 weeks, have you felt down, depressed or hopeless?: no Over the past 2 weeks, have you felt little interest or pleasure in doing things?:no   Gynecologic History Patient's last menstrual period was 06/07/2015. Contraception: tubal ligation Last Pap: several years. Results were: normal Last mammogram: n/a. Results were: n/a  Obstetric History OB History  Gravida Para Term Preterm AB SAB TAB Ectopic Multiple Living  4 3 3  1  1   3     # Outcome Date GA Lbr Len/2nd Weight Sex Delivery Anes PTL Lv  4 Term 07/01/13 [redacted]w[redacted]d 20:50 / 00:12 6 lb 3.5 oz (2.82 kg) M Vag-Spont EPI  Y     Comments: extra digit on left hand  3 Term 10/29/09 [redacted]w[redacted]d  7 lb 3 oz (3.26 kg) F Vag-Spont EPI  Y  2 Term 01/02/06 [redacted]w[redacted]d  7 lb 2 oz (3.232 kg) F Vag-Spont EPI N Y  1 TAB               Past Medical History  Diagnosis Date  . UTI (urinary tract infection)   . Medical history non-contributory   . Asthma     Past Surgical History  Procedure Laterality Date  . Cystectomy    . Wisdom tooth extraction    . Pilonidal cyst excision    . Tubal ligation N/A 07/01/2013    Procedure: POST PARTUM TUBAL LIGATION;   Surgeon: Allie Bossier, MD;  Location: [facility];  Service: Gynecology;  Laterality: N/A;  . Cholecystectomy       Current outpatient prescriptions:  .  albuterol (PROVENTIL HFA;VENTOLIN HFA) 108 (90 BASE) MCG/ACT inhaler, Inhale 2 puffs into the lungs every 6 (six) hours as needed for wheezing., Disp: 1 Inhaler, Rfl: 2 .  amitriptyline (ELAVIL) 25 MG tablet, Take 25 mg by mouth at bedtime., Disp: , Rfl:  .  HYDROcodone-acetaminophen (NORCO/VICODIN) 5-325 MG tablet, Take 1-2 tablets by mouth every 4 (four) hours as needed., Disp: 12 tablet, Rfl: 0 .  omeprazole (PRILOSEC) 20 MG capsule, Take 20 mg by mouth daily., Disp: , Rfl:  .  predniSONE (DELTASONE) 20 MG tablet, Take 20 mg by mouth 2 (two) times daily with a meal., Disp: , Rfl:  .  dicyclomine (BENTYL) 20 MG tablet, Take 1 tablet (20 mg total) by mouth 3 (three) times daily before meals., Disp: 42 tablet, Rfl: 1 .  HYDROcodone-ibuprofen (VICOPROFEN) 7.5-200 MG tablet, Take 1 tablet by mouth every 6 (six) hours as needed for moderate pain., Disp: 40 tablet, Rfl: 0 .  mycophenolate (CELLCEPT) 500 MG tablet, Take 1,000 mg by mouth 2 (two) times daily., Disp: , Rfl:  .  ondansetron (ZOFRAN) 4 MG tablet, Take 1  tablet (4 mg total) by mouth every 6 (six) hours. (Patient not taking: Reported on 07/03/2015), Disp: 12 tablet, Rfl: 0 Allergies  Allergen Reactions  . Celebrex [Celecoxib]     Social History  Substance Use Topics  . Smoking status: Current Every Day Smoker -- 0.25 packs/day    Types: Cigarettes  . Smokeless tobacco: Never Used     Comment: 4 CIGS A DAY- Stopped smoking when she had positive upt  . Alcohol Use: No    Family History  Problem Relation Age of Onset  . Alcohol abuse Neg Hx   . Arthritis Neg Hx   . Asthma Neg Hx   . Birth defects Neg Hx   . Cancer Neg Hx   . COPD Neg Hx   . Depression Neg Hx   . Diabetes Neg Hx   . Drug abuse Neg Hx   . Early death Neg Hx   . Hearing loss Neg Hx   . Heart disease Neg Hx   .  Hyperlipidemia Neg Hx   . Hypertension Neg Hx   . Kidney disease Neg Hx   . Learning disabilities Neg Hx   . Mental illness Neg Hx   . Mental retardation Neg Hx   . Miscarriages / Stillbirths Neg Hx   . Stroke Neg Hx   . Vision loss Neg Hx       Review of Systems  Constitutional: negative for fatigue and weight loss Respiratory: negative for cough and wheezing Cardiovascular: negative for chest pain, fatigue and palpitations Gastrointestinal: negative for abdominal pain and change in bowel habits Musculoskeletal:negative for myalgias Neurological: negative for gait problems and tremors Behavioral/Psych: negative for abusive relationship, depression Endocrine: negative for temperature intolerance   Genitourinary: positive for heavy and prolonged periods Integument/breast: negative for breast lump, breast tenderness, nipple discharge and skin lesion(s)    Objective:       BP 135/80 mmHg  Pulse 112  Wt 229 lb (103.874 kg)  LMP 06/07/2015 General:   alert  Skin:   no rash or abnormalities  Lungs:   clear to auscultation bilaterally  Heart:   regular rate and rhythm, S1, S2 normal, no murmur, click, rub or gallop  Breasts:   normal without suspicious masses, skin or nipple changes or axillary nodes  Abdomen:  normal findings: no organomegaly, soft, non-tender and no hernia  Pelvis:  External genitalia: normal general appearance Urinary system: urethral meatus normal and bladder without fullness, nontender Vaginal: normal without tenderness, induration or masses Cervix: normal appearance Adnexa: normal bimanual exam Uterus: anteverted and non-tender, normal size   Lab Review Urine pregnancy test Labs reviewed yes Radiologic studies reviewed no    Assessment:    Healthy female exam.    AUB   Plan:    Education reviewed: calcium supplements, low fat, low cholesterol diet, safe sex/STD prevention, self breast exams and weight bearing exercise. Contraception: tubal  ligation. Follow up in: 2 weeks.  Follow up.   Meds ordered this encounter  Medications  . HYDROcodone-ibuprofen (VICOPROFEN) 7.5-200 MG tablet    Sig: Take 1 tablet by mouth every 6 (six) hours as needed for moderate pain.    Dispense:  40 tablet    Refill:  0  . dicyclomine (BENTYL) 20 MG tablet    Sig: Take 1 tablet (20 mg total) by mouth 3 (three) times daily before meals.    Dispense:  42 tablet    Refill:  1   Orders Placed This Encounter  Procedures  .  SureSwab, Vaginosis/Vaginitis Plus

## 2015-07-04 ENCOUNTER — Encounter: Payer: Self-pay | Admitting: Obstetrics

## 2015-07-07 LAB — SURESWAB, VAGINOSIS/VAGINITIS PLUS
Atopobium vaginae: 5.7 Log (cells/mL)
C. albicans, DNA: NOT DETECTED
C. glabrata, DNA: NOT DETECTED
C. parapsilosis, DNA: NOT DETECTED
C. trachomatis RNA, TMA: NOT DETECTED
C. tropicalis, DNA: NOT DETECTED
Gardnerella vaginalis: >8 Log (cells/mL)
LACTOBACILLUS SPECIES: NOT DETECTED Log (cells/mL)
MEGASPHAERA SPECIES: 8 Log (cells/mL)
N. gonorrhoeae RNA, TMA: NOT DETECTED
T. vaginalis RNA, QL TMA: NOT DETECTED

## 2015-07-08 ENCOUNTER — Other Ambulatory Visit: Payer: Self-pay | Admitting: Obstetrics

## 2015-07-08 DIAGNOSIS — N760 Acute vaginitis: Principal | ICD-10-CM

## 2015-07-08 DIAGNOSIS — B9689 Other specified bacterial agents as the cause of diseases classified elsewhere: Secondary | ICD-10-CM

## 2015-07-08 LAB — PAP, TP IMAGING W/ HPV RNA, RFLX HPV TYPE 16,18/45: HPV mRNA, High Risk: NOT DETECTED

## 2015-07-08 MED ORDER — METRONIDAZOLE 500 MG PO TABS: 500.0000 mg | ORAL_TABLET | Freq: Two times a day (BID) | ORAL | Status: DC

## 2015-07-10 ENCOUNTER — Ambulatory Visit (HOSPITAL_COMMUNITY)
Admission: RE | Admit: 2015-07-10 | Discharge: 2015-07-10 | Disposition: A | Discharge status: Home / Self Care | Payer: Medicaid Other | Source: Ambulatory Visit | Attending: Obstetrics | Admitting: Obstetrics

## 2015-07-10 DIAGNOSIS — N946 Dysmenorrhea, unspecified: Secondary | ICD-10-CM | POA: Diagnosis not present

## 2015-07-10 DIAGNOSIS — N939 Abnormal uterine and vaginal bleeding, unspecified: Secondary | ICD-10-CM

## 2015-07-16 ENCOUNTER — Ambulatory Visit (INDEPENDENT_AMBULATORY_CARE_PROVIDER_SITE_OTHER): Payer: Medicaid Other | Admitting: Obstetrics

## 2015-07-16 ENCOUNTER — Encounter: Payer: Self-pay | Admitting: Obstetrics

## 2015-07-16 VITALS — BP 150/102 | HR 112 | Temp 98.7°F | Wt 237.0 lb

## 2015-07-16 DIAGNOSIS — N939 Abnormal uterine and vaginal bleeding, unspecified: Secondary | ICD-10-CM | POA: Diagnosis not present

## 2015-07-16 DIAGNOSIS — N946 Dysmenorrhea, unspecified: Secondary | ICD-10-CM | POA: Diagnosis not present

## 2015-07-16 MED ORDER — HYDROCODONE-IBUPROFEN 7.5-200 MG PO TABS: 1.0000 | ORAL_TABLET | Freq: Four times a day (QID) | ORAL | Status: DC | PRN

## 2015-07-16 MED ORDER — MEDROXYPROGESTERONE ACETATE 10 MG PO TABS
10.0000 mg | ORAL_TABLET | Freq: Every day | ORAL | Status: DC
Start: 2015-07-16 — End: 2015-08-21

## 2015-07-16 NOTE — Progress Notes (Signed)
Patient ID: Stacy [name], female   DOB: 09/14/1985, 29 y.o.   MRN: [id number]  Chief Complaint  Patient presents with  . Advice Only    Patient had an [place] last week- she is considering the HTA procedure for her bleeding.    HPI Stacy [name] is a 29 y.o. female.  H/O AUB.  Presents for ultrasound results and management recommendations.  HPI  Past Medical History  Diagnosis Date  . UTI (urinary tract infection)   . Medical history non-contributory   . Asthma     Past Surgical History  Procedure Laterality Date  . Cystectomy    . Wisdom tooth extraction    . Pilonidal cyst excision    . Tubal ligation N/A 07/01/2013    Procedure: POST PARTUM TUBAL LIGATION;  Surgeon: Allie Bossier, MD;  Location: [facility];  Service: Gynecology;  Laterality: N/A;  . Cholecystectomy      Family History  Problem Relation Age of Onset  . Alcohol abuse Neg Hx   . Arthritis Neg Hx   . Asthma Neg Hx   . Birth defects Neg Hx   . Cancer Neg Hx   . COPD Neg Hx   . Depression Neg Hx   . Diabetes Neg Hx   . Drug abuse Neg Hx   . Early death Neg Hx   . Hearing loss Neg Hx   . Heart disease Neg Hx   . Hyperlipidemia Neg Hx   . Hypertension Neg Hx   . Kidney disease Neg Hx   . Learning disabilities Neg Hx   . Mental illness Neg Hx   . Mental retardation Neg Hx   . Miscarriages / Stillbirths Neg Hx   . Stroke Neg Hx   . Vision loss Neg Hx     Social History Social History  Substance Use Topics  . Smoking status: Current Every Day Smoker -- 0.25 packs/day    Types: Cigarettes  . Smokeless tobacco: Never Used     Comment: 4 CIGS A DAY- Stopped smoking when she had positive upt  . Alcohol Use: No    Allergies  Allergen Reactions  . Celebrex [Celecoxib]     Current Outpatient Prescriptions  Medication Sig Dispense Refill  . albuterol (PROVENTIL HFA;VENTOLIN HFA) 108 (90 BASE) MCG/ACT inhaler Inhale 2 puffs into the lungs every 6 (six) hours as needed for wheezing. 1 Inhaler 2  .  amitriptyline (ELAVIL) 25 MG tablet Take 25 mg by mouth at bedtime.    . dicyclomine (BENTYL) 20 MG tablet Take 1 tablet (20 mg total) by mouth 3 (three) times daily before meals. 42 tablet 1  . HYDROcodone-ibuprofen (VICOPROFEN) 7.5-200 MG tablet Take 1 tablet by mouth every 6 (six) hours as needed for moderate pain. 40 tablet 0  . omeprazole (PRILOSEC) 20 MG capsule Take 20 mg by mouth daily.    . predniSONE (DELTASONE) 20 MG tablet Take 20 mg by mouth 2 (two) times daily with a meal.    . medroxyPROGESTERone (PROVERA) 10 MG tablet Take 1 tablet (10 mg total) by mouth daily. 30 tablet 0  . ondansetron (ZOFRAN) 4 MG tablet Take 1 tablet (4 mg total) by mouth every 6 (six) hours. (Patient not taking: Reported on 07/03/2015) 12 tablet 0   No current facility-administered medications for this visit.    Review of Systems Review of Systems Constitutional: negative for fatigue and weight loss Respiratory: negative for cough and wheezing Cardiovascular: negative for chest pain, fatigue and palpitations  Gastrointestinal: negative for abdominal pain and change in bowel habits Genitourinary: positive for heavy and painful periods Integument/breast: negative for nipple discharge Musculoskeletal:negative for myalgias Neurological: negative for gait problems and tremors Behavioral/Psych: negative for abusive relationship, depression Endocrine: negative for temperature intolerance     Blood pressure 150/102, pulse 112, temperature 98.7 F (37.1 C), weight 237 lb (107.502 kg), last menstrual period 07/13/2015.  Physical Exam Physical Exam:  Deferred  100% of 10 min visit spent on counseling and coordination of care.   Data Reviewed Ultrasound  Assessment     AUB.  Requests Endometrial Ablation. Dysmenorrhea     Plan    HTA Endometrial Ablation planned.  All questions answered to patient's satisfaction. Provera 10 mg po x 30 days for endometrial preparation. Vicoprofen Rx for  Dysmenorrhea F/U in 3 weeks for pre op H&P  No orders of the defined types were placed in this encounter.   Meds ordered this encounter  Medications  . medroxyPROGESTERone (PROVERA) 10 MG tablet    Sig: Take 1 tablet (10 mg total) by mouth daily.    Dispense:  30 tablet    Refill:  0  . HYDROcodone-ibuprofen (VICOPROFEN) 7.5-200 MG tablet    Sig: Take 1 tablet by mouth every 6 (six) hours as needed for moderate pain.    Dispense:  40 tablet    Refill:  0

## 2015-08-04 ENCOUNTER — Other Ambulatory Visit: Payer: Self-pay | Admitting: Ophthalmology

## 2015-08-04 DIAGNOSIS — H209 Unspecified iridocyclitis: Secondary | ICD-10-CM

## 2015-08-06 ENCOUNTER — Ambulatory Visit: Payer: Medicaid Other | Admitting: Obstetrics

## 2015-08-13 ENCOUNTER — Ambulatory Visit (INDEPENDENT_AMBULATORY_CARE_PROVIDER_SITE_OTHER): Payer: Medicaid Other | Admitting: Obstetrics

## 2015-08-13 ENCOUNTER — Encounter: Payer: Self-pay | Admitting: Obstetrics

## 2015-08-13 VITALS — BP 160/107 | HR 109 | Temp 98.4°F | Wt 237.0 lb

## 2015-08-13 DIAGNOSIS — N939 Abnormal uterine and vaginal bleeding, unspecified: Secondary | ICD-10-CM

## 2015-08-13 DIAGNOSIS — N946 Dysmenorrhea, unspecified: Secondary | ICD-10-CM | POA: Diagnosis not present

## 2015-08-13 MED ORDER — HYDROCODONE-ACETAMINOPHEN 10-300 MG PO TABS: 1.0000 | ORAL_TABLET | Freq: Four times a day (QID) | ORAL | Status: DC | PRN

## 2015-08-13 NOTE — Progress Notes (Signed)
Patient ID: Stacy [name], female   DOB: 01-Sep-1985, 30 y.o.   MRN: [id number]  Chief Complaint  Patient presents with  . Advice Only    Endometrial Abalation     HPI Stacy [name] is a 30 y.o. female.   H/O heavy and painful periods.  Desires Endometrial Ablation  HPI  Past Medical History  Diagnosis Date  . UTI (urinary tract infection)   . Medical history non-contributory   . Asthma     Past Surgical History  Procedure Laterality Date  . Cystectomy    . Wisdom tooth extraction    . Pilonidal cyst excision    . Tubal ligation N/A 07/01/2013    Procedure: POST PARTUM TUBAL LIGATION;  Surgeon: Allie Bossier, MD;  Location: [facility];  Service: Gynecology;  Laterality: N/A;  . Cholecystectomy      Family History  Problem Relation Age of Onset  . Alcohol abuse Neg Hx   . Arthritis Neg Hx   . Asthma Neg Hx   . Birth defects Neg Hx   . Cancer Neg Hx   . COPD Neg Hx   . Depression Neg Hx   . Diabetes Neg Hx   . Drug abuse Neg Hx   . Early death Neg Hx   . Hearing loss Neg Hx   . Heart disease Neg Hx   . Hyperlipidemia Neg Hx   . Hypertension Neg Hx   . Kidney disease Neg Hx   . Learning disabilities Neg Hx   . Mental illness Neg Hx   . Mental retardation Neg Hx   . Miscarriages / Stillbirths Neg Hx   . Stroke Neg Hx   . Vision loss Neg Hx     Social History Social History  Substance Use Topics  . Smoking status: Current Every Day Smoker -- 0.25 packs/day    Types: Cigarettes  . Smokeless tobacco: Never Used     Comment: 4 CIGS A DAY- Stopped smoking when she had positive upt  . Alcohol Use: No    Allergies  Allergen Reactions  . Celebrex [Celecoxib]     Current Outpatient Prescriptions  Medication Sig Dispense Refill  . albuterol (PROVENTIL HFA;VENTOLIN HFA) 108 (90 BASE) MCG/ACT inhaler Inhale 2 puffs into the lungs every 6 (six) hours as needed for wheezing. 1 Inhaler 2  . amitriptyline (ELAVIL) 25 MG tablet Take 25 mg by mouth at bedtime.    .  dicyclomine (BENTYL) 20 MG tablet Take 1 tablet (20 mg total) by mouth 3 (three) times daily before meals. 42 tablet 1  . HYDROcodone-ibuprofen (VICOPROFEN) 7.5-200 MG tablet Take 1 tablet by mouth every 6 (six) hours as needed for moderate pain. 40 tablet 0  . medroxyPROGESTERone (PROVERA) 10 MG tablet Take 1 tablet (10 mg total) by mouth daily. 30 tablet 0  . omeprazole (PRILOSEC) 20 MG capsule Take 20 mg by mouth daily.    . predniSONE (DELTASONE) 20 MG tablet Take 20 mg by mouth 2 (two) times daily with a meal.    . Hydrocodone-Acetaminophen 10-300 MG TABS Take 1 tablet by mouth every 6 (six) hours as needed. 40 each 0   No current facility-administered medications for this visit.    Review of Systems Review of Systems Constitutional: negative for fatigue and weight loss Respiratory: negative for cough and wheezing Cardiovascular: negative for chest pain, fatigue and palpitations Gastrointestinal: negative for abdominal pain and change in bowel habits Genitourinary: positive for heavy and painful periods Integument/breast: negative for  nipple discharge Musculoskeletal:negative for myalgias Neurological: negative for gait problems and tremors Behavioral/Psych: negative for abusive relationship, depression Endocrine: negative for temperature intolerance     Blood pressure 160/107, pulse 109, temperature 98.4 F (36.9 C), weight 237 lb (107.502 kg), last menstrual period 07/13/2015.  Physical Exam Physical Exam General:   alert  Skin:   no rash or abnormalities  Lungs:   clear to auscultation bilaterally  Heart:   regular rate and rhythm, S1, S2 normal, no murmur, click, rub or gallop  Breasts:   normal without suspicious masses, skin or nipple changes or axillary nodes  Abdomen:  normal findings: no organomegaly, soft, non-tender and no hernia  Pelvis:  External genitalia: normal general appearance Urinary system: urethral meatus normal and bladder without fullness,  nontender Vaginal: normal without tenderness, induration or masses Cervix: normal appearance Adnexa: normal bimanual exam Uterus: anteverted and non-tender, normal size      Data Reviewed Labs  Assessment     AUB     Plan    HTA Endometrial Ablation planned.  All questions answered.  No orders of the defined types were placed in this encounter.   Meds ordered this encounter  Medications  . Hydrocodone-Acetaminophen 10-300 MG TABS    Sig: Take 1 tablet by mouth every 6 (six) hours as needed.    Dispense:  40 each    Refill:  0

## 2015-08-18 ENCOUNTER — Other Ambulatory Visit: Payer: Self-pay | Admitting: *Deleted

## 2015-08-19 ENCOUNTER — Encounter (HOSPITAL_COMMUNITY): Payer: Self-pay | Admitting: *Deleted

## 2015-08-19 ENCOUNTER — Other Ambulatory Visit: Payer: Self-pay | Admitting: *Deleted

## 2015-08-21 ENCOUNTER — Ambulatory Visit (HOSPITAL_COMMUNITY): Payer: Medicaid Other | Admitting: Anesthesiology

## 2015-08-21 ENCOUNTER — Encounter (HOSPITAL_COMMUNITY)
Admission: RE | Disposition: A | Discharge status: Home / Self Care | Payer: Self-pay | Source: Ambulatory Visit | Attending: Obstetrics

## 2015-08-21 ENCOUNTER — Other Ambulatory Visit: Payer: Self-pay | Admitting: Obstetrics

## 2015-08-21 ENCOUNTER — Ambulatory Visit (HOSPITAL_COMMUNITY)
Admission: RE | Admit: 2015-08-21 | Discharge: 2015-08-21 | Disposition: A | Discharge status: Home / Self Care | Payer: Medicaid Other | Source: Ambulatory Visit | Attending: Obstetrics | Admitting: Obstetrics

## 2015-08-21 DIAGNOSIS — K219 Gastro-esophageal reflux disease without esophagitis: Secondary | ICD-10-CM | POA: Diagnosis not present

## 2015-08-21 DIAGNOSIS — N939 Abnormal uterine and vaginal bleeding, unspecified: Secondary | ICD-10-CM | POA: Insufficient documentation

## 2015-08-21 DIAGNOSIS — F17210 Nicotine dependence, cigarettes, uncomplicated: Secondary | ICD-10-CM | POA: Insufficient documentation

## 2015-08-21 HISTORY — PX: DILITATION & CURRETTAGE/HYSTROSCOPY WITH HYDROTHERMAL ABLATION: SHX5570

## 2015-08-21 LAB — CBC
HCT: 38.2 % (ref 36.0–46.0)
Hemoglobin: 12.5 g/dL (ref 12.0–15.0)
MCH: 29.5 pg (ref 26.0–34.0)
MCHC: 32.7 g/dL (ref 30.0–36.0)
MCV: 90.1 fL (ref 78.0–100.0)
Platelets: 305 10*3/uL (ref 150–400)
RBC: 4.24 MIL/uL (ref 3.87–5.11)
RDW: 15.4 % (ref 11.5–15.5)
WBC: 14.8 10*3/uL — ABNORMAL HIGH (ref 4.0–10.5)

## 2015-08-21 LAB — PREGNANCY, URINE: Preg Test, Ur: NEGATIVE

## 2015-08-21 SURGERY — DILATATION & CURETTAGE/HYSTEROSCOPY WITH HYDROTHERMAL ABLATION
Anesthesia: General

## 2015-08-21 MED ORDER — FENTANYL CITRATE (PF) 100 MCG/2ML IJ SOLN
INTRAMUSCULAR | Status: AC
  Administered 2015-08-21: 50 ug via INTRAVENOUS
  Filled 2015-08-21: qty 2

## 2015-08-21 MED ORDER — LABETALOL HCL 5 MG/ML IV SOLN
INTRAVENOUS | Status: DC | PRN
  Administered 2015-08-21: 10 mg via INTRAVENOUS
  Administered 2015-08-21 (×2): 5 mg via INTRAVENOUS

## 2015-08-21 MED ORDER — SODIUM BICARBONATE 8.4 % IV SOLN
INTRAVENOUS | Status: AC
  Filled 2015-08-21: qty 50

## 2015-08-21 MED ORDER — LIDOCAINE HCL 1 % IJ SOLN
INTRAMUSCULAR | Status: AC
  Filled 2015-08-21: qty 20

## 2015-08-21 MED ORDER — KETOROLAC TROMETHAMINE 30 MG/ML IJ SOLN
INTRAMUSCULAR | Status: DC | PRN
  Administered 2015-08-21: 30 mg via INTRAVENOUS

## 2015-08-21 MED ORDER — FENTANYL CITRATE (PF) 100 MCG/2ML IJ SOLN
INTRAMUSCULAR | Status: DC | PRN
  Administered 2015-08-21: 150 ug via INTRAVENOUS
  Administered 2015-08-21: 100 ug via INTRAVENOUS

## 2015-08-21 MED ORDER — TRIAMTERENE-HCTZ 37.5-25 MG PO CAPS: 1.0000 | ORAL_CAPSULE | Freq: Every day | ORAL | Status: DC

## 2015-08-21 MED ORDER — SUCCINYLCHOLINE CHLORIDE 20 MG/ML IJ SOLN
INTRAMUSCULAR | Status: AC
Start: 2015-08-21 — End: 2015-08-21
  Filled 2015-08-21: qty 1

## 2015-08-21 MED ORDER — LIDOCAINE HCL (CARDIAC) 20 MG/ML IV SOLN
INTRAVENOUS | Status: AC
  Filled 2015-08-21: qty 5

## 2015-08-21 MED ORDER — MIDAZOLAM HCL 2 MG/2ML IJ SOLN
INTRAMUSCULAR | Status: DC | PRN
  Administered 2015-08-21: 2 mg via INTRAVENOUS

## 2015-08-21 MED ORDER — FLUMAZENIL 0.5 MG/5ML IV SOLN
INTRAVENOUS | Status: AC
  Filled 2015-08-21: qty 5

## 2015-08-21 MED ORDER — FENTANYL CITRATE (PF) 100 MCG/2ML IJ SOLN
50.0000 ug | INTRAMUSCULAR | Status: DC | PRN
  Administered 2015-08-21 (×2): 50 ug via INTRAVENOUS

## 2015-08-21 MED ORDER — LIDOCAINE HCL (CARDIAC) 20 MG/ML IV SOLN
INTRAVENOUS | Status: DC | PRN
  Administered 2015-08-21: 100 mg via INTRAVENOUS

## 2015-08-21 MED ORDER — HYDROCODONE-ACETAMINOPHEN 7.5-325 MG/15ML PO SOLN: 10.0000 mL | Freq: Once | ORAL | Status: DC

## 2015-08-21 MED ORDER — ONDANSETRON HCL 4 MG/2ML IJ SOLN
INTRAMUSCULAR | Status: AC
  Filled 2015-08-21: qty 2

## 2015-08-21 MED ORDER — PROMETHAZINE HCL 25 MG/ML IJ SOLN
INTRAMUSCULAR | Status: AC
  Filled 2015-08-21: qty 1

## 2015-08-21 MED ORDER — DEXAMETHASONE SODIUM PHOSPHATE 10 MG/ML IJ SOLN
INTRAMUSCULAR | Status: DC | PRN
  Administered 2015-08-21: 4 mg via INTRAVENOUS

## 2015-08-21 MED ORDER — PROMETHAZINE HCL 25 MG/ML IJ SOLN
6.2500 mg | INTRAMUSCULAR | Status: DC | PRN
  Administered 2015-08-21: 6.25 mg via INTRAVENOUS

## 2015-08-21 MED ORDER — HYDROMORPHONE HCL 1 MG/ML IJ SOLN
INTRAMUSCULAR | Status: AC
  Administered 2015-08-21: 0.5 mg via INTRAVENOUS
  Filled 2015-08-21: qty 1

## 2015-08-21 MED ORDER — FENTANYL CITRATE (PF) 250 MCG/5ML IJ SOLN
INTRAMUSCULAR | Status: AC
  Filled 2015-08-21: qty 5

## 2015-08-21 MED ORDER — HYDROCODONE-ACETAMINOPHEN 7.5-325 MG PO TABS
1.0000 | ORAL_TABLET | Freq: Once | ORAL | Status: AC
  Administered 2015-08-21: 1 via ORAL

## 2015-08-21 MED ORDER — SODIUM CHLORIDE 0.9 % IR SOLN
Status: DC | PRN
  Administered 2015-08-21: 3000 mL

## 2015-08-21 MED ORDER — HYDROCODONE-ACETAMINOPHEN 7.5-325 MG PO TABS
ORAL_TABLET | ORAL | Status: AC
  Filled 2015-08-21: qty 1

## 2015-08-21 MED ORDER — HYDROCODONE-ACETAMINOPHEN 10-325 MG PO TABS: 1.0000 | ORAL_TABLET | Freq: Once | ORAL | Status: DC

## 2015-08-21 MED ORDER — ONDANSETRON HCL 4 MG/2ML IJ SOLN
INTRAMUSCULAR | Status: DC | PRN
  Administered 2015-08-21: 4 mg via INTRAVENOUS

## 2015-08-21 MED ORDER — CARVEDILOL 25 MG PO TABS: 25.0000 mg | ORAL_TABLET | Freq: Two times a day (BID) | ORAL | Status: DC

## 2015-08-21 MED ORDER — LACTATED RINGERS IV SOLN
INTRAVENOUS | Status: DC
  Administered 2015-08-21: 13:00:00 via INTRAVENOUS

## 2015-08-21 MED ORDER — SCOPOLAMINE 1 MG/3DAYS TD PT72
1.0000 | MEDICATED_PATCH | Freq: Once | TRANSDERMAL | Status: DC
  Administered 2015-08-21: 1.5 mg via TRANSDERMAL

## 2015-08-21 MED ORDER — PROPOFOL 10 MG/ML IV BOLUS
INTRAVENOUS | Status: AC
  Filled 2015-08-21: qty 20

## 2015-08-21 MED ORDER — SUCCINYLCHOLINE CHLORIDE 20 MG/ML IJ SOLN
INTRAMUSCULAR | Status: DC | PRN
  Administered 2015-08-21: 140 mg via INTRAVENOUS

## 2015-08-21 MED ORDER — SCOPOLAMINE 1 MG/3DAYS TD PT72
MEDICATED_PATCH | TRANSDERMAL | Status: AC
  Filled 2015-08-21: qty 1

## 2015-08-21 MED ORDER — LABETALOL HCL 5 MG/ML IV SOLN
40.0000 mg | Freq: Once | INTRAVENOUS | Status: AC
Start: 2015-08-21 — End: 2015-08-21
  Administered 2015-08-21: 40 mg via INTRAVENOUS

## 2015-08-21 MED ORDER — PROPOFOL 10 MG/ML IV BOLUS
INTRAVENOUS | Status: DC | PRN
  Administered 2015-08-21: 200 mg via INTRAVENOUS

## 2015-08-21 MED ORDER — MIDAZOLAM HCL 2 MG/2ML IJ SOLN
INTRAMUSCULAR | Status: AC
  Filled 2015-08-21: qty 2

## 2015-08-21 MED ORDER — DEXAMETHASONE SODIUM PHOSPHATE 4 MG/ML IJ SOLN
INTRAMUSCULAR | Status: AC
  Filled 2015-08-21: qty 1

## 2015-08-21 MED ORDER — HYDROMORPHONE HCL 1 MG/ML IJ SOLN
0.5000 mg | INTRAMUSCULAR | Status: AC | PRN
  Administered 2015-08-21 (×4): 0.5 mg via INTRAVENOUS

## 2015-08-21 MED ORDER — KETOROLAC TROMETHAMINE 30 MG/ML IJ SOLN
INTRAMUSCULAR | Status: AC
  Filled 2015-08-21: qty 1

## 2015-08-21 MED ORDER — HYDROCODONE-ACETAMINOPHEN 10-300 MG PO TABS: 1.0000 | ORAL_TABLET | Freq: Four times a day (QID) | ORAL | Status: DC | PRN

## 2015-08-21 MED ORDER — LABETALOL HCL 5 MG/ML IV SOLN
INTRAVENOUS | Status: AC
  Administered 2015-08-21: 40 mg via INTRAVENOUS
  Filled 2015-08-21: qty 8

## 2015-08-21 MED ORDER — LABETALOL HCL 5 MG/ML IV SOLN
INTRAVENOUS | Status: AC
  Filled 2015-08-21: qty 4

## 2015-08-21 MED ORDER — LACTATED RINGERS IV SOLN
INTRAVENOUS | Status: DC
  Administered 2015-08-21: 1000 mL via INTRAVENOUS
  Administered 2015-08-21: 11:00:00 via INTRAVENOUS

## 2015-08-21 MED ORDER — SILVER NITRATE-POT NITRATE 75-25 % EX MISC
CUTANEOUS | Status: DC | PRN
  Administered 2015-08-21: 1

## 2015-08-21 MED ORDER — FENTANYL CITRATE (PF) 100 MCG/2ML IJ SOLN
25.0000 ug | INTRAMUSCULAR | Status: DC | PRN
  Administered 2015-08-21 (×4): 50 ug via INTRAVENOUS

## 2015-08-21 MED ORDER — LIDOCAINE HCL 1 % IJ SOLN
INTRAMUSCULAR | Status: DC | PRN
Start: 2015-08-21 — End: 2015-08-21
  Administered 2015-08-21: 20 mL

## 2015-08-21 SURGICAL SUPPLY — 16 items
CATH ROBINSON RED A/P 16FR (CATHETERS) ×2 IMPLANT
CLOTH BEACON ORANGE TIMEOUT ST (SAFETY) ×2 IMPLANT
CONTAINER PREFILL 10% NBF 60ML (FORM) ×2 IMPLANT
ELECT REM PT RETURN 9FT ADLT (ELECTROSURGICAL)
ELECTRODE REM PT RTRN 9FT ADLT (ELECTROSURGICAL) IMPLANT
GLOVE BIO SURGEON STRL SZ8 (GLOVE) ×4 IMPLANT
GLOVE BIOGEL PI IND STRL 7.0 (GLOVE) ×1 IMPLANT
GLOVE BIOGEL PI INDICATOR 7.0 (GLOVE) ×1
GOWN STRL REUS W/TWL LRG LVL3 (GOWN DISPOSABLE) ×4 IMPLANT
NEEDLE HYPO 22GX1.5 SAFETY (NEEDLE) ×2 IMPLANT
NS IRRIG 1000ML POUR BTL (IV SOLUTION) IMPLANT
PACK VAGINAL MINOR WOMEN LF (CUSTOM PROCEDURE TRAY) ×2 IMPLANT
PAD OB MATERNITY 4.3X12.25 (PERSONAL CARE ITEMS) ×2 IMPLANT
SET GENESYS HTA PROCERVA (MISCELLANEOUS) ×2 IMPLANT
TOWEL OR 17X24 6PK STRL BLUE (TOWEL DISPOSABLE) ×4 IMPLANT
WATER STERILE IRR 1000ML POUR (IV SOLUTION) ×2 IMPLANT

## 2015-08-21 NOTE — Anesthesia Preprocedure Evaluation (Addendum)
Anesthesia Evaluation  Patient identified by MRN, date of birth, ID band Patient awake    Reviewed: Allergy & Precautions, H&P , NPO status , Patient's Chart, lab work & pertinent test results  Airway Mallampati: II  TM Distance: >3 FB Neck ROM: full    Dental no notable dental hx. (+) Teeth Intact, Dental Advisory Given   Pulmonary asthma , Current Smoker, former smoker,    Pulmonary exam normal breath sounds clear to auscultation       Cardiovascular Exercise Tolerance: Good negative cardio ROS Normal cardiovascular exam Rhythm:regular Rate:Normal     Neuro/Psych negative neurological ROS  negative psych ROS   GI/Hepatic negative GI ROS, Neg liver ROS, GERD  Poorly Controlled and Medicated,  Endo/Other  negative endocrine ROS  Renal/GU negative Renal ROS  negative genitourinary   Musculoskeletal   Abdominal (+) + obese,   Peds  Hematology negative hematology ROS (+)   Anesthesia Other Findings       Reproductive/Obstetrics (+) Pregnancy                            Anesthesia Physical Anesthesia Plan  ASA: II  Anesthesia Plan: General   Post-op Pain Management:    Induction: Intravenous  Airway Management Planned: Oral ETT  Additional Equipment:   Intra-op Plan:   Post-operative Plan: Extubation in OR  Informed Consent: I have reviewed the patients History and Physical, chart, labs and discussed the procedure including the risks, benefits and alternatives for the proposed anesthesia with the patient or authorized representative who has indicated his/her understanding and acceptance.   Dental Advisory Given  Plan Discussed with: CRNA and Surgeon  Anesthesia Plan Comments:        Anesthesia Quick Evaluation

## 2015-08-21 NOTE — Anesthesia Postprocedure Evaluation (Signed)
Anesthesia Post Note  Patient: Stacy [name]  Procedure(s) Performed: Procedure(s) (LRB): DILATATION & CURETTAGE/HYSTEROSCOPY WITH HYDROTHERMAL ABLATION (N/A)  Patient location during evaluation: PACU Anesthesia Type: General Level of consciousness: awake and alert Pain management: pain level controlled Vital Signs Assessment: post-procedure vital signs reviewed and stable Respiratory status: spontaneous breathing, nonlabored ventilation, respiratory function stable and patient connected to nasal cannula oxygen Cardiovascular status: blood pressure returned to baseline and stable Postop Assessment: no signs of nausea or vomiting Anesthetic complications: no    Last Vitals:  Filed Vitals:   08/21/15 1445 08/21/15 1455  BP: 194/121 189/130  Pulse:    Temp:    Resp: 16 17    Last Pain:  Filed Vitals:   08/21/15 1457  PainSc: 9                  EWELL,CHARLES L

## 2015-08-21 NOTE — Transfer of Care (Signed)
Immediate Anesthesia Transfer of Care Note  Patient: Stacy [name]  Procedure(s) Performed: Procedure(s): DILATATION & CURETTAGE/HYSTEROSCOPY WITH HYDROTHERMAL ABLATION (N/A)  Patient Location: PACU  Anesthesia Type:General  Level of Consciousness: awake, alert  and oriented  Airway & Oxygen Therapy: Patient Spontanous Breathing and Patient connected to nasal cannula oxygen  Post-op Assessment: Report given to RN and Post -op Vital signs reviewed and stable  Post vital signs: Reviewed and stable  Last Vitals:  Filed Vitals:   08/21/15 1009  BP: 119/85  Pulse: 108  Temp: 36.9 C  Resp: 16    Complications: No apparent anesthesia complications

## 2015-08-21 NOTE — Anesthesia Procedure Notes (Signed)
Procedure Name: Intubation Date/Time: 08/21/2015 11:04 AM Performed by: Shanon Payor Pre-anesthesia Checklist: Patient identified, Emergency Drugs available, Suction available, Patient being monitored and Timeout performed Patient Re-evaluated:Patient Re-evaluated prior to inductionOxygen Delivery Method: Circle system utilized Preoxygenation: Pre-oxygenation with 100% oxygen Intubation Type: Combination inhalational/ intravenous induction Ventilation: Two handed mask ventilation required and Oral airway inserted - appropriate to patient size Laryngoscope Size: Glidescope and 3 Grade View: Grade III Tube type: Oral Tube size: 7.0 mm Number of attempts: 3 Airway Equipment and Method: Stylet Placement Confirmation: ETT inserted through vocal cords under direct vision,  positive ETCO2 and breath sounds checked- equal and bilateral Secured at: 20 cm Tube secured with: Tape Dental Injury: Teeth and Oropharynx as per pre-operative assessment  Difficulty Due To: Difficult Airway- due to large tongue, Difficult Airway- due to immobile epiglottis and Difficult Airway- due to anterior larynx Comments: DL with MAC 3 unable to visualize cords, mask ventilated effectively, CRNA DL with glidescope and unable to visualize cords, masked patient 2 hands able to oxygenate effectively. DL by Dr. Leta Jungling and patient intubated, positive ETCO2 noted, equal chest rise, equal bilateral breath sounds noted. OGT placed to drainage. Patient stable.

## 2015-08-21 NOTE — Discharge Instructions (Signed)
Patient and husband were informed they have to go to the pharmacy tonight 08/21/15 to fill the new presrciptions for hypertension and take the meds tonight,  Carvedilol and Triamterene-hyrochlorothiazide  Prescribed by Dr Aron Baba. He re-evaluted the patient and informed her she Must call his office on Monday 09/21/15 for the doctor's name he is referring her to for her hypertension. He wants the patient to go home. Matilde Bash, RN

## 2015-08-21 NOTE — OR Nursing (Signed)
Dr Aron Baba here with patient and will send her home to pick up her new  2 prescriptions for her hypertension.  Patient is instructed to take them as soon as possible. She is instructed to call his office on Monday and Dr Clearance Coots will have a referral for her to see a doctor for her hypertension Matilde Bash, RN

## 2015-08-21 NOTE — H&P (Signed)
Stacy [name] is an 30 y.o. female. Heavy prolonged periods.  Pertinent Gynecological History: Menses: flow is excessive with use of many pads or tampons on heaviest days Bleeding: dysfunctional uterine bleeding Contraception: tubal ligation DES exposure: denies Blood transfusions: none Sexually transmitted diseases: no past history and currently at risk Previous GYN Procedures: Tubal sterilization  Last mammogram: n/a Date: n/a Last pap: normal Date: 2016   Menstrual History: Menarche age: 76  No LMP recorded.    Past Medical History  Diagnosis Date  . UTI (urinary tract infection)   . Medical history non-contributory   . Asthma     Past Surgical History  Procedure Laterality Date  . Cystectomy    . Wisdom tooth extraction    . Pilonidal cyst excision    . Tubal ligation N/A 07/01/2013    Procedure: POST PARTUM TUBAL LIGATION;  Surgeon: Allie Bossier, MD;  Location: [facility];  Service: Gynecology;  Laterality: N/A;  . Cholecystectomy    . Tubal ligation    . Tubal ligation N/A     Family History  Problem Relation Age of Onset  . Alcohol abuse Neg Hx   . Arthritis Neg Hx   . Asthma Neg Hx   . Birth defects Neg Hx   . Cancer Neg Hx   . COPD Neg Hx   . Depression Neg Hx   . Diabetes Neg Hx   . Drug abuse Neg Hx   . Early death Neg Hx   . Hearing loss Neg Hx   . Heart disease Neg Hx   . Hyperlipidemia Neg Hx   . Hypertension Neg Hx   . Kidney disease Neg Hx   . Learning disabilities Neg Hx   . Mental illness Neg Hx   . Mental retardation Neg Hx   . Miscarriages / Stillbirths Neg Hx   . Stroke Neg Hx   . Vision loss Neg Hx     Social History:  reports that she has been smoking Cigarettes.  She has been smoking about 0.25 packs per day. She has never used smokeless tobacco. She reports that she does not drink alcohol or use illicit drugs.  Allergies:  Allergies  Allergen Reactions  . Celebrex [Celecoxib] Hives    Prescriptions prior to admission   Medication Sig Dispense Refill Last Dose  . albuterol (PROVENTIL HFA;VENTOLIN HFA) 108 (90 BASE) MCG/ACT inhaler Inhale 2 puffs into the lungs every 6 (six) hours as needed for wheezing. 1 Inhaler 2 rescue  . amitriptyline (ELAVIL) 25 MG tablet Take 25 mg by mouth at bedtime.   08/18/2015 at Unknown time  . Hydrocodone-Acetaminophen 10-300 MG TABS Take 1 tablet by mouth every 6 (six) hours as needed. (Patient taking differently: Take 1 tablet by mouth every 6 (six) hours as needed (for severe pain). ) 40 each 0 08/19/2015 at Unknown time  . mycophenolate (CELLCEPT) 500 MG tablet Take 2 tablets by mouth 2 (two) times daily.  3 08/20/2015 at Unknown time  . omeprazole (PRILOSEC) 20 MG capsule Take 20 mg by mouth daily.   08/19/2015 at Unknown time  . QUEtiapine (SEROQUEL) 200 MG tablet Take 200 mg by mouth 2 (two) times daily.  6 08/20/2015 at Unknown time  . dicyclomine (BENTYL) 20 MG tablet Take 1 tablet (20 mg total) by mouth 3 (three) times daily before meals. (Patient not taking: Reported on 08/21/2015) 42 tablet 1 Not Taking at Unknown time  . medroxyPROGESTERone (PROVERA) 10 MG tablet Take 1 tablet (10 mg  total) by mouth daily. (Patient not taking: Reported on 08/21/2015) 30 tablet 0 Completed Course at Unknown time    Review of Systems  All other systems reviewed and are negative.   Blood pressure 119/85, pulse 108, temperature 98.5 F (36.9 C), temperature source Oral, resp. rate 16, height 5\' 5"  (1.651 m), weight 238 lb (107.956 kg), SpO2 100 %. Physical Exam  Nursing note and vitals reviewed. Constitutional: She is oriented to person, place, and time. She appears well-developed and well-nourished.  HENT:  Head: Normocephalic and atraumatic.  Eyes: Conjunctivae are normal. Pupils are equal, round, and reactive to light.  Neck: Normal range of motion. Neck supple.  Cardiovascular: Normal rate and regular rhythm.   Respiratory: Effort normal and breath sounds normal.  GI: Soft. Bowel sounds are  normal.  Genitourinary: Vagina normal and uterus normal.  Musculoskeletal: Normal range of motion.  Neurological: She is alert and oriented to person, place, and time.  Skin: Skin is warm and dry.  Psychiatric: She has a normal mood and affect. Her behavior is normal. Judgment and thought content normal.    Results for orders placed or performed during the hospital encounter of 08/21/15 (from the past 24 hour(s))  Pregnancy, urine     Status: None   Collection Time: 08/21/15 10:00 AM  Result Value Ref Range   Preg Test, Ur NEGATIVE NEGATIVE    No results found.  Assessment/Plan: AUB.  Plan Hysteroscopy, D&C and HTA Endometrial Ablation.   HARPER,CHARLES A 08/21/2015, 10:25 AM

## 2015-08-21 NOTE — Op Note (Signed)
Preop Diagnosis: ABNORMAL UTERINE BLEEDING    Postop Diagnosis: ABNORMAL UTERINE BLEEDING    Procedure: DILATATION & CURETTAGE/HYSTEROSCOPY WITH HYDROTHERMAL ABLATION   Anesthesia: General   Anesthesiologist: No responsible provider has been recorded for the case.   Attending: Brock Bad, MD   Assistant: None  Findings:  Endometrial cavity without polyps or submucous fibroids  Pathology:  Endometrial curettings  Fluids:   UOP:  40ml  EBL:  19ml  Complications:  None  Procedure: The patient was taken to the operating room after risks benefits and alternatives were discussed with patient, the patient verbalized understanding and consent signed and witnessed. The patient was placed under general anesthesia and prepped and draped in normal sterile fashion. A bivalve speculum was placed in the patient's vagina and the anterior lip of the cervix was grasped with a single-tooth tenaculum. The cervix was dilated for passage of the hysteroscope. The uterus sounded to 7cm.  The hysteroscope was introduced into the uterine cavity with findings as noted above. A curettage was performed and currettings sent to pathology. The hysteroscope was reintroduced and hydrothermal ablation was performed without difficulty. The tenaculum and bivalve speculum were removed and there was good hemostasis at the tenaculum sites. Sponge lap and needle count was correct. The patient tolerated procedure well and was returned to the recovery room in good condition.

## 2015-08-23 ENCOUNTER — Encounter (HOSPITAL_COMMUNITY): Payer: Self-pay | Admitting: Obstetrics

## 2015-09-04 ENCOUNTER — Ambulatory Visit (INDEPENDENT_AMBULATORY_CARE_PROVIDER_SITE_OTHER): Payer: Medicaid Other | Admitting: Obstetrics

## 2015-09-04 ENCOUNTER — Encounter: Payer: Self-pay | Admitting: Obstetrics

## 2015-09-04 VITALS — BP 124/86 | HR 100 | Wt 231.0 lb

## 2015-09-04 DIAGNOSIS — N946 Dysmenorrhea, unspecified: Secondary | ICD-10-CM | POA: Diagnosis not present

## 2015-09-04 DIAGNOSIS — N939 Abnormal uterine and vaginal bleeding, unspecified: Secondary | ICD-10-CM | POA: Diagnosis not present

## 2015-09-04 NOTE — Progress Notes (Signed)
Patient ID: Stacy [name], female   DOB: 28-Sep-1985, 30 y.o.   MRN: [id number]  Chief Complaint  Patient presents with  . Routine Post Op    post ablation 2.3.17, some cramping, light spotting    HPI Stacy [name] is a 30 y.o. female.  H/O AUB.  S/P Endometrial Ablation.  Light spotting.  Mild cramping.  HPI  Past Medical History  Diagnosis Date  . UTI (urinary tract infection)   . Medical history non-contributory   . Asthma     Past Surgical History  Procedure Laterality Date  . Cystectomy    . Wisdom tooth extraction    . Pilonidal cyst excision    . Tubal ligation N/A 07/01/2013    Procedure: POST PARTUM TUBAL LIGATION;  Surgeon: Allie Bossier, MD;  Location: [facility];  Service: Gynecology;  Laterality: N/A;  . Cholecystectomy    . Tubal ligation    . Tubal ligation N/A   . Dilitation & currettage/hystroscopy with hydrothermal ablation N/A 08/21/2015    Procedure: DILATATION & CURETTAGE/HYSTEROSCOPY WITH HYDROTHERMAL ABLATION;  Surgeon: Brock Bad, MD;  Location: [facility];  Service: Gynecology;  Laterality: N/A;    Family History  Problem Relation Age of Onset  . Alcohol abuse Neg Hx   . Arthritis Neg Hx   . Asthma Neg Hx   . Birth defects Neg Hx   . Cancer Neg Hx   . COPD Neg Hx   . Depression Neg Hx   . Diabetes Neg Hx   . Drug abuse Neg Hx   . Early death Neg Hx   . Hearing loss Neg Hx   . Heart disease Neg Hx   . Hyperlipidemia Neg Hx   . Hypertension Neg Hx   . Kidney disease Neg Hx   . Learning disabilities Neg Hx   . Mental illness Neg Hx   . Mental retardation Neg Hx   . Miscarriages / Stillbirths Neg Hx   . Stroke Neg Hx   . Vision loss Neg Hx     Social History Social History  Substance Use Topics  . Smoking status: Current Every Day Smoker -- 0.25 packs/day    Types: Cigarettes  . Smokeless tobacco: Never Used     Comment: 4 CIGS A DAY- Stopped smoking when she had positive upt  . Alcohol Use: No    Allergies  Allergen Reactions   . Celebrex [Celecoxib] Hives    Current Outpatient Prescriptions  Medication Sig Dispense Refill  . amitriptyline (ELAVIL) 25 MG tablet Take 25 mg by mouth at bedtime.    . carvedilol (COREG) 25 MG tablet Take 1 tablet (25 mg total) by mouth 2 (two) times daily with a meal. 60 tablet 11  . Hydrocodone-Acetaminophen (VICODIN HP) 10-300 MG TABS Take 1 tablet by mouth every 6 (six) hours as needed. 40 each 0  . mycophenolate (CELLCEPT) 500 MG tablet Take 2 tablets by mouth 2 (two) times daily.  3  . omeprazole (PRILOSEC) 20 MG capsule Take 20 mg by mouth daily.    . QUEtiapine (SEROQUEL) 200 MG tablet Take 200 mg by mouth 2 (two) times daily.  6  . triamterene-hydrochlorothiazide (DYAZIDE) 37.5-25 MG capsule Take 1 each (1 capsule total) by mouth daily. 30 capsule 11  . albuterol (PROVENTIL HFA;VENTOLIN HFA) 108 (90 BASE) MCG/ACT inhaler Inhale 2 puffs into the lungs every 6 (six) hours as needed for wheezing. 1 Inhaler 2  . dicyclomine (BENTYL) 20 MG tablet Take 1 tablet (  20 mg total) by mouth 3 (three) times daily before meals. (Patient not taking: Reported on 08/21/2015) 42 tablet 1  . Hydrocodone-Acetaminophen 10-300 MG TABS Take 1 tablet by mouth every 6 (six) hours as needed. (Patient taking differently: Take 1 tablet by mouth every 6 (six) hours as needed (for severe pain). ) 40 each 0   No current facility-administered medications for this visit.    Review of Systems Review of Systems Constitutional: negative for fatigue and weight loss Respiratory: negative for cough and wheezing Cardiovascular: negative for chest pain, fatigue and palpitations Gastrointestinal: negative for abdominal pain and change in bowel habits Genitourinary:negative Integument/breast: negative for nipple discharge Musculoskeletal:negative for myalgias Neurological: negative for gait problems and tremors Behavioral/Psych: negative for abusive relationship, depression Endocrine: negative for temperature  intolerance     Blood pressure 124/86, pulse 100, weight 231 lb (104.781 kg).  Physical Exam Physical Exam            General:  Alert and no distress Abdomen:  normal findings: no organomegaly, soft, non-tender and no hernia  Pelvis:  External genitalia: normal general appearance Urinary system: urethral meatus normal and bladder without fullness, nontender Vaginal: normal without tenderness, induration or masses Cervix: normal appearance Adnexa: normal bimanual exam Uterus: anteverted and non-tender, normal size      Data Reviewed Pathology  Assessment     AUB.  S/P HTA Endometrial Ablation.  Doing well.     Plan    F/U 3 months  Orders Placed This Encounter  Procedures  . SureSwab, Vaginosis/Vaginitis Plus   No orders of the defined types were placed in this encounter.

## 2015-09-10 LAB — SURESWAB, VAGINOSIS/VAGINITIS PLUS
Atopobium vaginae: NOT DETECTED Log (cells/mL)
C. albicans, DNA: NOT DETECTED
C. glabrata, DNA: NOT DETECTED
C. parapsilosis, DNA: NOT DETECTED
C. trachomatis RNA, TMA: NOT DETECTED
C. tropicalis, DNA: NOT DETECTED
Gardnerella vaginalis: NOT DETECTED Log (cells/mL)
LACTOBACILLUS SPECIES: NOT DETECTED Log (cells/mL)
MEGASPHAERA SPECIES: NOT DETECTED Log (cells/mL)
N. gonorrhoeae RNA, TMA: NOT DETECTED
T. vaginalis RNA, QL TMA: NOT DETECTED

## 2015-09-14 ENCOUNTER — Other Ambulatory Visit: Payer: Self-pay | Admitting: Ophthalmology

## 2015-09-14 DIAGNOSIS — H44113 Panuveitis, bilateral: Secondary | ICD-10-CM

## 2015-09-17 ENCOUNTER — Other Ambulatory Visit (INDEPENDENT_AMBULATORY_CARE_PROVIDER_SITE_OTHER): Payer: Medicaid Other

## 2015-09-17 ENCOUNTER — Other Ambulatory Visit: Payer: Self-pay | Admitting: Obstetrics

## 2015-09-17 ENCOUNTER — Telehealth: Payer: Self-pay | Admitting: *Deleted

## 2015-09-17 VITALS — BP 152/102 | HR 101 | Wt 236.0 lb

## 2015-09-17 DIAGNOSIS — L309 Dermatitis, unspecified: Secondary | ICD-10-CM

## 2015-09-17 DIAGNOSIS — N939 Abnormal uterine and vaginal bleeding, unspecified: Secondary | ICD-10-CM

## 2015-09-17 DIAGNOSIS — N946 Dysmenorrhea, unspecified: Secondary | ICD-10-CM

## 2015-09-17 DIAGNOSIS — R102 Pelvic and perineal pain: Secondary | ICD-10-CM

## 2015-09-17 LAB — POCT URINALYSIS DIPSTICK
Bilirubin, UA: NEGATIVE
Blood, UA: 50
Glucose, UA: NEGATIVE
Ketones, UA: NEGATIVE
Leukocytes, UA: NEGATIVE
Nitrite, UA: NEGATIVE
Spec Grav, UA: 1.02
Urobilinogen, UA: NEGATIVE
pH, UA: 8

## 2015-09-17 MED ORDER — CLOTRIMAZOLE 1 % EX CREA: 1.0000 "application " | TOPICAL_CREAM | Freq: Two times a day (BID) | CUTANEOUS | Status: DC

## 2015-09-17 MED ORDER — IBUPROFEN 800 MG PO TABS: 800.0000 mg | ORAL_TABLET | Freq: Three times a day (TID) | ORAL | Status: DC | PRN

## 2015-09-17 MED ORDER — HYDROCODONE-ACETAMINOPHEN 10-300 MG PO TABS: 1.0000 | ORAL_TABLET | Freq: Four times a day (QID) | ORAL | Status: DC | PRN

## 2015-09-17 NOTE — Telephone Encounter (Signed)
Patient state she had an ablation in February and she has had 2 cycles since. She has had severe cramping and would like a refill of her pain medication. Call has been forwarded to provider to call patient to discuss her pain management.

## 2015-09-18 LAB — URINE CULTURE: Colony Count: 50000

## 2015-11-25 ENCOUNTER — Encounter (HOSPITAL_COMMUNITY): Payer: Self-pay | Admitting: Family Medicine

## 2015-11-25 ENCOUNTER — Emergency Department (HOSPITAL_COMMUNITY)
Admission: EM | Admit: 2015-11-25 | Discharge: 2015-11-25 | Disposition: A | Discharge status: Home / Self Care | Payer: Medicaid Other | Attending: Emergency Medicine | Admitting: Emergency Medicine

## 2015-11-25 DIAGNOSIS — E669 Obesity, unspecified: Secondary | ICD-10-CM | POA: Insufficient documentation

## 2015-11-25 DIAGNOSIS — F17210 Nicotine dependence, cigarettes, uncomplicated: Secondary | ICD-10-CM | POA: Diagnosis not present

## 2015-11-25 DIAGNOSIS — R197 Diarrhea, unspecified: Secondary | ICD-10-CM | POA: Diagnosis not present

## 2015-11-25 DIAGNOSIS — E119 Type 2 diabetes mellitus without complications: Secondary | ICD-10-CM | POA: Diagnosis not present

## 2015-11-25 DIAGNOSIS — I10 Essential (primary) hypertension: Secondary | ICD-10-CM | POA: Insufficient documentation

## 2015-11-25 DIAGNOSIS — E876 Hypokalemia: Secondary | ICD-10-CM

## 2015-11-25 DIAGNOSIS — Z87440 Personal history of urinary (tract) infections: Secondary | ICD-10-CM | POA: Insufficient documentation

## 2015-11-25 DIAGNOSIS — Z3202 Encounter for pregnancy test, result negative: Secondary | ICD-10-CM | POA: Insufficient documentation

## 2015-11-25 DIAGNOSIS — R109 Unspecified abdominal pain: Secondary | ICD-10-CM

## 2015-11-25 DIAGNOSIS — R112 Nausea with vomiting, unspecified: Secondary | ICD-10-CM

## 2015-11-25 DIAGNOSIS — J45909 Unspecified asthma, uncomplicated: Secondary | ICD-10-CM | POA: Insufficient documentation

## 2015-11-25 DIAGNOSIS — Z9851 Tubal ligation status: Secondary | ICD-10-CM | POA: Insufficient documentation

## 2015-11-25 DIAGNOSIS — G8929 Other chronic pain: Secondary | ICD-10-CM | POA: Insufficient documentation

## 2015-11-25 DIAGNOSIS — Z79899 Other long term (current) drug therapy: Secondary | ICD-10-CM | POA: Insufficient documentation

## 2015-11-25 DIAGNOSIS — Z9049 Acquired absence of other specified parts of digestive tract: Secondary | ICD-10-CM | POA: Insufficient documentation

## 2015-11-25 DIAGNOSIS — R1084 Generalized abdominal pain: Secondary | ICD-10-CM | POA: Diagnosis present

## 2015-11-25 HISTORY — DX: Type 2 diabetes mellitus without complications: E11.9

## 2015-11-25 LAB — URINALYSIS, ROUTINE W REFLEX MICROSCOPIC
Glucose, UA: NEGATIVE mg/dL
Ketones, ur: 40 mg/dL — AB
Leukocytes, UA: NEGATIVE
Nitrite: NEGATIVE
Protein, ur: NEGATIVE mg/dL
Specific Gravity, Urine: 1.025 (ref 1.005–1.030)
pH: 6 (ref 5.0–8.0)

## 2015-11-25 LAB — COMPREHENSIVE METABOLIC PANEL
ALT: 34 U/L (ref 14–54)
AST: 38 U/L (ref 15–41)
Albumin: 4.2 g/dL (ref 3.5–5.0)
Alkaline Phosphatase: 56 U/L (ref 38–126)
Anion gap: 12 (ref 5–15)
BUN: <5 mg/dL — ABNORMAL LOW (ref 6–20)
CO2: 23 mmol/L (ref 22–32)
Calcium: 9.3 mg/dL (ref 8.9–10.3)
Chloride: 106 mmol/L (ref 101–111)
Creatinine, Ser: 0.74 mg/dL (ref 0.44–1.00)
GFR calc Af Amer: >60 mL/min (ref >60)
GFR calc non Af Amer: >60 mL/min (ref >60)
Glucose, Bld: 126 mg/dL — ABNORMAL HIGH (ref 65–99)
Potassium: 3 mmol/L — ABNORMAL LOW (ref 3.5–5.1)
Sodium: 141 mmol/L (ref 135–145)
Total Bilirubin: 0.9 mg/dL (ref 0.3–1.2)
Total Protein: 8 g/dL (ref 6.5–8.1)

## 2015-11-25 LAB — URINE MICROSCOPIC-ADD ON

## 2015-11-25 LAB — CBC
HCT: 40.3 % (ref 36.0–46.0)
Hemoglobin: 13.4 g/dL (ref 12.0–15.0)
MCH: 29.2 pg (ref 26.0–34.0)
MCHC: 33.3 g/dL (ref 30.0–36.0)
MCV: 87.8 fL (ref 78.0–100.0)
Platelets: 298 10*3/uL (ref 150–400)
RBC: 4.59 MIL/uL (ref 3.87–5.11)
RDW: 14.6 % (ref 11.5–15.5)
WBC: 10.7 10*3/uL — ABNORMAL HIGH (ref 4.0–10.5)

## 2015-11-25 LAB — I-STAT BETA HCG BLOOD, ED (MC, WL, AP ONLY): I-stat hCG, quantitative: <5 m[IU]/mL (ref <5)

## 2015-11-25 LAB — LIPASE, BLOOD: Lipase: 26 U/L (ref 11–51)

## 2015-11-25 MED ORDER — MORPHINE SULFATE (PF) 4 MG/ML IV SOLN
4.0000 mg | Freq: Once | INTRAVENOUS | Status: AC
Start: 2015-11-25 — End: 2015-11-25
  Administered 2015-11-25: 4 mg via INTRAVENOUS
  Filled 2015-11-25: qty 1

## 2015-11-25 MED ORDER — METOCLOPRAMIDE HCL 5 MG/ML IJ SOLN
10.0000 mg | Freq: Once | INTRAMUSCULAR | Status: AC
  Administered 2015-11-25: 10 mg via INTRAVENOUS
  Filled 2015-11-25: qty 2

## 2015-11-25 MED ORDER — SODIUM CHLORIDE 0.9 % IV BOLUS (SEPSIS)
1000.0000 mL | Freq: Once | INTRAVENOUS | Status: AC
  Administered 2015-11-25: 1000 mL via INTRAVENOUS

## 2015-11-25 MED ORDER — PROMETHAZINE HCL 25 MG RE SUPP: 25.0000 mg | Freq: Four times a day (QID) | RECTAL | Status: DC | PRN

## 2015-11-25 MED ORDER — HYDROMORPHONE HCL 1 MG/ML IJ SOLN
1.0000 mg | Freq: Once | INTRAMUSCULAR | Status: AC
  Administered 2015-11-25: 1 mg via INTRAVENOUS
  Filled 2015-11-25: qty 1

## 2015-11-25 MED ORDER — POTASSIUM CHLORIDE ER 20 MEQ PO TBCR: 40.0000 meq | EXTENDED_RELEASE_TABLET | Freq: Every day | ORAL | Status: DC

## 2015-11-25 MED ORDER — POTASSIUM CHLORIDE CRYS ER 20 MEQ PO TBCR
60.0000 meq | EXTENDED_RELEASE_TABLET | Freq: Once | ORAL | Status: AC
  Administered 2015-11-25: 60 meq via ORAL
  Filled 2015-11-25: qty 3

## 2015-11-25 MED ORDER — ONDANSETRON HCL 4 MG/2ML IJ SOLN
4.0000 mg | Freq: Once | INTRAMUSCULAR | Status: AC
  Administered 2015-11-25: 4 mg via INTRAVENOUS
  Filled 2015-11-25: qty 2

## 2015-11-25 MED ORDER — HYDROCODONE-ACETAMINOPHEN 5-325 MG PO TABS: 1.0000 | ORAL_TABLET | Freq: Four times a day (QID) | ORAL | Status: DC | PRN

## 2015-11-25 NOTE — ED Provider Notes (Signed)
CSN: [id number]     Arrival date & time 11/25/15  1225 History  By signing my name below, I, Stacy [name], attest that this documentation has been prepared under the direction and in the presence of Stacy [name], [place]. Electronically Signed: Angelene [name], ED Scribe. 11/25/2015. 3:08 PM.    Chief Complaint  Patient presents with  . Nausea  . Abdominal Pain   The history is provided by the patient. No language interpreter was used.   HPI Comments: Stacy [name] is a 30 y.o. female with a hx of DM and HTN who presents to the Emergency Department complaining of gradually worsening intermittent episodes of chronic generalized abdominal pain onset July 2016. She reports associated nausea and multiple episodes of vomiting (last episode was this morning of yellow substance) onset 8 weeks ago. She adds that she has had non-bloody watery diarrhea onset 3 weeks ago. She explains that for the last 8 weeks she has not been able to keep anything down as she vomits approx. 30 seconds after eating. She reports that she has tried Zofran and Phenergan with no relief. She states that she had an upper endoscopy but it did not reveal anything. No colonoscopy. She states that she is a current cigarette smoker but denies any alcohol use. She denies any family hx of Crohn's disease or GI issues. She also denies any urinary symptoms, fever, or chills. Pt states that she currently has a yeast infection for which she is taking diflucan.    Past Medical History  Diagnosis Date  . UTI (urinary tract infection)   . Medical history non-contributory   . Asthma   . Diabetes mellitus without complication (HCC)   . Hypertension    Past Surgical History  Procedure Laterality Date  . Cystectomy    . Wisdom tooth extraction    . Pilonidal cyst excision    . Tubal ligation N/A 07/01/2013    Procedure: POST PARTUM TUBAL LIGATION;  Surgeon: Stacy Bossier, MD;  Location: [facility];  Service: Gynecology;  Laterality: N/A;   . Cholecystectomy    . Tubal ligation    . Tubal ligation N/A   . Dilitation & currettage/hystroscopy with hydrothermal ablation N/A 08/21/2015    Procedure: DILATATION & CURETTAGE/HYSTEROSCOPY WITH HYDROTHERMAL ABLATION;  Surgeon: Stacy Bad, MD;  Location: [facility];  Service: Gynecology;  Laterality: N/A;   Family History  Problem Relation Age of Onset  . Alcohol abuse Neg Hx   . Arthritis Neg Hx   . Asthma Neg Hx   . Birth defects Neg Hx   . Cancer Neg Hx   . COPD Neg Hx   . Depression Neg Hx   . Diabetes Neg Hx   . Drug abuse Neg Hx   . Early death Neg Hx   . Hearing loss Neg Hx   . Heart disease Neg Hx   . Hyperlipidemia Neg Hx   . Hypertension Neg Hx   . Kidney disease Neg Hx   . Learning disabilities Neg Hx   . Mental illness Neg Hx   . Mental retardation Neg Hx   . Miscarriages / Stillbirths Neg Hx   . Stroke Neg Hx   . Vision loss Neg Hx    Social History  Substance Use Topics  . Smoking status: Current Every Day Smoker -- 0.25 packs/day    Types: Cigarettes  . Smokeless tobacco: Never Used     Comment: 4 CIGS A DAY- Stopped smoking when she had positive upt  .  Alcohol Use: No   OB History    Gravida Para Term Preterm AB TAB SAB Ectopic Multiple Living   4 3 3  1 1    3      Review of Systems  Constitutional: Negative for fever and chills.  Gastrointestinal: Positive for nausea, vomiting, abdominal pain and diarrhea.  Genitourinary: Negative for dysuria, hematuria and vaginal discharge.  All other systems reviewed and are negative.     Allergies  Celebrex  Home Medications   Prior to Admission medications   Medication Sig Start Date End Date Taking? Authorizing Provider  albuterol (PROVENTIL HFA;VENTOLIN HFA) 108 (90 BASE) MCG/ACT inhaler Inhale 2 puffs into the lungs every 6 (six) hours as needed for wheezing. 06/26/13   Deirdre 14/10/14, CNM  amitriptyline (ELAVIL) 25 MG tablet Take 25 mg by mouth at bedtime.    Historical Provider, MD   carvedilol (COREG) 25 MG tablet Take 1 tablet (25 mg total) by mouth 2 (two) times daily with a meal. 08/21/15   10/19/15, MD  clotrimazole (LOTRIMIN) 1 % cream Apply 1 application topically 2 (two) times daily. 09/17/15   11/17/15, MD  dicyclomine (BENTYL) 20 MG tablet Take 1 tablet (20 mg total) by mouth 3 (three) times daily before meals. Patient not taking: Reported on 08/21/2015 07/03/15   07/05/15, MD  Hydrocodone-Acetaminophen 10-300 MG TABS Take 1 tablet by mouth every 6 (six) hours as needed. 09/17/15   11/17/15, MD  ibuprofen (ADVIL,MOTRIN) 800 MG tablet Take 1 tablet (800 mg total) by mouth every 8 (eight) hours as needed. 09/17/15   11/17/15, MD  mycophenolate (CELLCEPT) 500 MG tablet Take 2 tablets by mouth 2 (two) times daily. 07/27/15   Historical Provider, MD  omeprazole (PRILOSEC) 20 MG capsule Take 20 mg by mouth daily.    Historical Provider, MD  QUEtiapine (SEROQUEL) 200 MG tablet Take 200 mg by mouth 2 (two) times daily. 08/02/15   Historical Provider, MD  triamterene-hydrochlorothiazide (DYAZIDE) 37.5-25 MG capsule Take 1 each (1 capsule total) by mouth daily. 08/21/15   10/19/15, MD   BP 141/104 mmHg  Pulse 86  Temp(Src) 98.7 F (37.1 C) (Oral)  Resp 18  Ht 5\' 5"  (1.651 m)  Wt 218 lb (98.884 kg)  BMI 36.28 kg/m2  SpO2 100% Physical Exam  Constitutional: She is oriented to person, place, and time. She appears well-developed and well-nourished.  Obese, NAD  HENT:  Head: Normocephalic and atraumatic.  MM dry  Eyes: Conjunctivae are normal.  Neck: Normal range of motion. Neck supple.  Cardiovascular: Normal rate, regular rhythm and normal heart sounds.   Pulmonary/Chest: Effort normal and breath sounds normal. No respiratory distress.  Abdominal: Soft. Bowel sounds are normal. She exhibits no distension.  Minimal diffuse abd ttp without guarding or rebound. No CVA tenderness   Genitourinary:  Deferred  Neurological: She is alert  and oriented to person, place, and time.  Skin: Skin is warm and dry.  Psychiatric: She has a normal mood and affect.  Nursing note and vitals reviewed.   ED Course  Procedures (including critical care time) DIAGNOSTIC STUDIES: Oxygen Saturation is 100% on RA, normal by my interpretation.    COORDINATION OF CARE: 3:04 PM- Pt advised of plan for treatment and pt agrees. Pt will receive IV fluids, Reglan, and Potassium Chloride tablets. Explained that presentation does not warrant a CT scan.    Labs Review Labs Reviewed  COMPREHENSIVE METABOLIC PANEL -  Abnormal; Notable for the following:    Potassium 3.0 (*)    Glucose, Bld 126 (*)    BUN <5 (*)    All other components within normal limits  CBC - Abnormal; Notable for the following:    WBC 10.7 (*)    All other components within normal limits  URINALYSIS, ROUTINE W REFLEX MICROSCOPIC (NOT AT [facility]) - Abnormal; Notable for the following:    Color, Urine AMBER (*)    APPearance CLOUDY (*)    Hgb urine dipstick TRACE (*)    Bilirubin Urine SMALL (*)    Ketones, ur 40 (*)    All other components within normal limits  URINE MICROSCOPIC-ADD ON - Abnormal; Notable for the following:    Squamous Epithelial / LPF TOO NUMEROUS TO COUNT (*)    Bacteria, UA MANY (*)    All other components within normal limits  URINE CULTURE  LIPASE, BLOOD  I-STAT BETA HCG BLOOD, ED ([facility], [facility], [facility] ONLY)    Noelle Penner, PA-C has personally reviewed and evaluated these lab results as part of her medical decision-making.  MDM   Final diagnoses:  Chronic abdominal pain  Non-intractable vomiting with nausea, vomiting of unspecified type  Hypokalemia    Labs with hypokalemia of 3.0. WBC 10.7 which is improved from prior. Urine does show 40 ketones but otherwise with signs of contamination as there are TNTC squamous epithelial cells, many bacteria. Will hold off on abx therapy at this time and send for culture. Pt continues to endorse abdominal pain and  nausea, though no emesis in the ED. She states that with her chronic pain typically she requires dilaudid. Will give dose of dilaudid and further anti-emetics.  Pt now feels much improved and states she is ready to go homne. She is tolerating PO. Rx given for phenergan suppository and short course of norco prn severe pain. GI referral given for f/u for chronic abdominal pain. ER return precautions given.   I personally performed the services described in this documentation, which was scribed in my presence. The recorded information has been reviewed and is accurate.   Carlene Coria, PA-C 11/25/15 1816  Loren Racer, MD 11/26/15 [phone]

## 2015-11-25 NOTE — ED Notes (Signed)
IV start attempted but was not successful.  IV team paged

## 2015-11-25 NOTE — Discharge Instructions (Signed)
Please call [facility] to schedule a follow up appointment for further evaluation of your chronic abdominal pain, nausea, and vomiting. Otherwise I will give you a couple prescriptions to help with your symptoms. Return to the ER for new or worsening symptoms.

## 2015-11-25 NOTE — ED Notes (Signed)
Pt here for chronic abd pain, nausea, vomiting. sts she has been dealing with this for a year and had her gallbladder removed. sts this was last year and still not better.

## 2015-11-27 LAB — URINE CULTURE

## 2015-12-01 ENCOUNTER — Encounter (HOSPITAL_COMMUNITY): Payer: Self-pay | Admitting: *Deleted

## 2015-12-01 ENCOUNTER — Emergency Department (HOSPITAL_COMMUNITY): Payer: Medicaid Other

## 2015-12-01 ENCOUNTER — Emergency Department (HOSPITAL_COMMUNITY)
Admission: EM | Admit: 2015-12-01 | Discharge: 2015-12-01 | Disposition: A | Discharge status: Home / Self Care | Payer: Medicaid Other | Attending: Emergency Medicine | Admitting: Emergency Medicine

## 2015-12-01 DIAGNOSIS — F17210 Nicotine dependence, cigarettes, uncomplicated: Secondary | ICD-10-CM | POA: Insufficient documentation

## 2015-12-01 DIAGNOSIS — I10 Essential (primary) hypertension: Secondary | ICD-10-CM | POA: Diagnosis not present

## 2015-12-01 DIAGNOSIS — J45909 Unspecified asthma, uncomplicated: Secondary | ICD-10-CM | POA: Diagnosis not present

## 2015-12-01 DIAGNOSIS — Z87440 Personal history of urinary (tract) infections: Secondary | ICD-10-CM | POA: Diagnosis not present

## 2015-12-01 DIAGNOSIS — R1084 Generalized abdominal pain: Secondary | ICD-10-CM | POA: Diagnosis present

## 2015-12-01 DIAGNOSIS — Z79899 Other long term (current) drug therapy: Secondary | ICD-10-CM | POA: Diagnosis not present

## 2015-12-01 DIAGNOSIS — E119 Type 2 diabetes mellitus without complications: Secondary | ICD-10-CM | POA: Diagnosis not present

## 2015-12-01 DIAGNOSIS — Z7984 Long term (current) use of oral hypoglycemic drugs: Secondary | ICD-10-CM | POA: Diagnosis not present

## 2015-12-01 DIAGNOSIS — R112 Nausea with vomiting, unspecified: Secondary | ICD-10-CM | POA: Insufficient documentation

## 2015-12-01 DIAGNOSIS — G8929 Other chronic pain: Secondary | ICD-10-CM | POA: Insufficient documentation

## 2015-12-01 LAB — BASIC METABOLIC PANEL
Anion gap: 11 (ref 5–15)
BUN: <5 mg/dL — ABNORMAL LOW (ref 6–20)
CO2: 21 mmol/L — ABNORMAL LOW (ref 22–32)
Calcium: 9.4 mg/dL (ref 8.9–10.3)
Chloride: 105 mmol/L (ref 101–111)
Creatinine, Ser: 0.76 mg/dL (ref 0.44–1.00)
GFR calc Af Amer: >60 mL/min (ref >60)
GFR calc non Af Amer: >60 mL/min (ref >60)
Glucose, Bld: 116 mg/dL — ABNORMAL HIGH (ref 65–99)
Potassium: 3 mmol/L — ABNORMAL LOW (ref 3.5–5.1)
Sodium: 137 mmol/L (ref 135–145)

## 2015-12-01 LAB — I-STAT TROPONIN, ED: Troponin i, poc: 0 ng/mL (ref 0.00–0.08)

## 2015-12-01 LAB — CBC
HCT: 40.1 % (ref 36.0–46.0)
Hemoglobin: 13.4 g/dL (ref 12.0–15.0)
MCH: 29.2 pg (ref 26.0–34.0)
MCHC: 33.4 g/dL (ref 30.0–36.0)
MCV: 87.4 fL (ref 78.0–100.0)
Platelets: 275 10*3/uL (ref 150–400)
RBC: 4.59 MIL/uL (ref 3.87–5.11)
RDW: 14.3 % (ref 11.5–15.5)
WBC: 8.9 10*3/uL (ref 4.0–10.5)

## 2015-12-01 NOTE — ED Provider Notes (Signed)
CSN: [id number]     Arrival date & time 12/01/15  1027 History   First MD Initiated Contact with Patient 12/01/15 1212     Chief Complaint  Patient presents with  . Chest Pain     (Consider location/radiation/quality/duration/timing/severity/associated sxs/prior Treatment) Patient is a 30 y.o. female presenting with abdominal pain. The history is provided by the patient. No language interpreter was used.  Abdominal Pain Pain location:  Generalized Pain quality: aching   Pain radiates to:  Does not radiate Pain severity:  Mild Duration: months. Timing:  Constant Progression:  Worsening Chronicity:  Chronic Context: recent illness   Relieved by:  Nothing Worsened by:  Nothing tried Associated symptoms: vomiting   Risk factors: multiple surgeries   Pt reports she has not been able to eat or drink in 6 weeks.  Pt reports she is taking potassium but vomits after taking.  Pt reports she has been losing a lot of weight.    Past Medical History  Diagnosis Date  . UTI (urinary tract infection)   . Medical history non-contributory   . Asthma   . Diabetes mellitus without complication (HCC)   . Hypertension    Past Surgical History  Procedure Laterality Date  . Cystectomy    . Wisdom tooth extraction    . Pilonidal cyst excision    . Tubal ligation N/A 07/01/2013    Procedure: POST PARTUM TUBAL LIGATION;  Surgeon: Allie Bossier, MD;  Location: [facility];  Service: Gynecology;  Laterality: N/A;  . Cholecystectomy    . Tubal ligation    . Tubal ligation N/A   . Dilitation & currettage/hystroscopy with hydrothermal ablation N/A 08/21/2015    Procedure: DILATATION & CURETTAGE/HYSTEROSCOPY WITH HYDROTHERMAL ABLATION;  Surgeon: Brock Bad, MD;  Location: [facility];  Service: Gynecology;  Laterality: N/A;   Family History  Problem Relation Age of Onset  . Alcohol abuse Neg Hx   . Arthritis Neg Hx   . Asthma Neg Hx   . Birth defects Neg Hx   . Cancer Neg Hx   . COPD Neg Hx   .  Depression Neg Hx   . Diabetes Neg Hx   . Drug abuse Neg Hx   . Early death Neg Hx   . Hearing loss Neg Hx   . Heart disease Neg Hx   . Hyperlipidemia Neg Hx   . Hypertension Neg Hx   . Kidney disease Neg Hx   . Learning disabilities Neg Hx   . Mental illness Neg Hx   . Mental retardation Neg Hx   . Miscarriages / Stillbirths Neg Hx   . Stroke Neg Hx   . Vision loss Neg Hx    Social History  Substance Use Topics  . Smoking status: Current Every Day Smoker -- 0.20 packs/day    Types: Cigarettes  . Smokeless tobacco: Never Used     Comment: 4 CIGS A DAY- Stopped smoking when she had positive upt  . Alcohol Use: No   OB History    Gravida Para Term Preterm AB TAB SAB Ectopic Multiple Living   4 3 3  1 1    3      Review of Systems  Gastrointestinal: Positive for vomiting and abdominal pain.  All other systems reviewed and are negative.     Allergies  Celebrex  Home Medications   Prior to Admission medications   Medication Sig Start Date End Date Taking? Authorizing Provider  albuterol (PROVENTIL HFA;VENTOLIN HFA) 108 (90 BASE)  MCG/ACT inhaler Inhale 2 puffs into the lungs every 6 (six) hours as needed for wheezing. 06/26/13  Yes Deirdre C Poe, CNM  amitriptyline (ELAVIL) 25 MG tablet Take 25 mg by mouth at bedtime.   Yes Historical Provider, MD  carvedilol (COREG) 25 MG tablet Take 1 tablet (25 mg total) by mouth 2 (two) times daily with a meal. 08/21/15  Yes Brock Bad, MD  glipiZIDE (GLUCOTROL) 10 MG tablet Take 10 mg by mouth daily before breakfast.   Yes Historical Provider, MD  HYDROcodone-acetaminophen (NORCO/VICODIN) 5-325 MG tablet Take 1-2 tablets by mouth every 6 (six) hours as needed for severe pain. 11/25/15  Yes Ace Gins Sam, PA-C  ibuprofen (ADVIL,MOTRIN) 800 MG tablet Take 1 tablet (800 mg total) by mouth every 8 (eight) hours as needed. 09/17/15  Yes Brock Bad, MD  metFORMIN (GLUCOPHAGE) 500 MG tablet Take 500 mg by mouth 2 (two) times daily.  09/25/15  Yes Historical Provider, MD  mycophenolate (CELLCEPT) 500 MG tablet Take 1,000 mg by mouth 2 (two) times daily.  07/27/15  Yes Historical Provider, MD  omeprazole (PRILOSEC) 20 MG capsule Take 20 mg by mouth daily.   Yes Historical Provider, MD  promethazine (PHENERGAN) 25 MG suppository Place 1 suppository (25 mg total) rectally every 6 (six) hours as needed for nausea or vomiting. 11/25/15  Yes Ace Gins Sam, PA-C  QUEtiapine (SEROQUEL) 200 MG tablet Take 200 mg by mouth 2 (two) times daily. 08/02/15  Yes Historical Provider, MD  triamterene-hydrochlorothiazide (DYAZIDE) 37.5-25 MG capsule Take 1 each (1 capsule total) by mouth daily. 08/21/15  Yes Brock Bad, MD  clotrimazole (LOTRIMIN) 1 % cream Apply 1 application topically 2 (two) times daily. Patient not taking: Reported on 12/01/2015 09/17/15   Brock Bad, MD  dicyclomine (BENTYL) 20 MG tablet Take 1 tablet (20 mg total) by mouth 3 (three) times daily before meals. Patient not taking: Reported on 08/21/2015 07/03/15   Brock Bad, MD  potassium chloride 20 MEQ TBCR Take 40 mEq by mouth daily. Patient not taking: Reported on 12/01/2015 11/25/15   Ace Gins Sam, PA-C   BP 138/76 mmHg  Pulse 67  Temp(Src) 99.5 F (37.5 C) (Oral)  Resp 18  Ht 5\' 5"  (1.651 m)  Wt 96.673 kg  BMI 35.47 kg/m2  SpO2 100%  LMP 12/01/2015 Physical Exam  Constitutional: She is oriented to person, place, and time. She appears well-developed and well-nourished.  HENT:  Head: Normocephalic.  Right Ear: External ear normal.  Left Ear: External ear normal.  Nose: Nose normal.  Mouth/Throat: Oropharynx is clear and moist.  Eyes: Conjunctivae and EOM are normal. Pupils are equal, round, and reactive to light.  Neck: Normal range of motion.  Cardiovascular: Normal rate and normal heart sounds.   Pulmonary/Chest: Effort normal.  Abdominal: Soft. She exhibits no distension.  Musculoskeletal: Normal range of motion.  Neurological: She is alert and  oriented to person, place, and time.  Skin: Skin is warm.  Psychiatric: She has a normal mood and affect.  Nursing note and vitals reviewed.   ED Course  Procedures (including critical care time) Labs Review Labs Reviewed  BASIC METABOLIC PANEL - Abnormal; Notable for the following:    Potassium 3.0 (*)    CO2 21 (*)    Glucose, Bld 116 (*)    BUN <5 (*)    All other components within normal limits  CBC  I-STAT TROPOININ, ED    Imaging Review Dg Chest 2 View  12/01/2015  CLINICAL DATA:  Three-day history of chest pain with shortness of breath EXAM: CHEST  2 VIEW COMPARISON:  June 30, 2015 FINDINGS: There is no edema or consolidation. The heart size and pulmonary vascularity are normal. No adenopathy. No pneumothorax. No bone lesions. IMPRESSION: No edema or consolidation. Electronically Signed   By: Bretta Bang III M.D.   On: 12/01/2015 11:18   I have personally reviewed and evaluated these images and lab results as part of my medical decision-making.   EKG Interpretation None      MDM Blood pressure and heart rate are normal.  Pt has normal BUN and Creat.  Troponin is normal.  Chest xray is normal.   Pt heard on TV that pseudotumor cerbri can cause vomiting.  Pt had a normal LP at [facility] 18 months ago.  She has had an upper gi/endoscopy this year at [facility].   She is scheduled to see Enid Baas in June.    I reviewed records from [facility].  Pt's weight was 94. Kilos 9/15.  Current weight is 96 kilos.  Pt has good skin turgor. Lips are moist. Pt does not seem dehydrated or malnourished.  I advised pt to keep gi appointment.  Keep taking her potassium.  Pt declined in nausea medications.  Pt states they make her vomit,  (even suppositories)  Final diagnoses:  Nausea and vomiting, vomiting of unspecified type    An After Visit Summary was printed and given to the patient.    Lonia Skinner Morrison, PA-C 12/01/15 1408  Jerelyn Scott, MD 12/01/15 1409

## 2015-12-01 NOTE — ED Notes (Signed)
Pt c/o L sided CP that radiates into L shoulder onset x 2 days ago, unrelieved with left over Vicodin from previous gallbladder sx last year, pt denies SOB, pt c/o chronic n/v/d that is not worse than normal, pt sees GI MD, A&O x4

## 2015-12-01 NOTE — ED Notes (Signed)
Pt left without being d/c.

## 2015-12-01 NOTE — Discharge Instructions (Signed)

## 2015-12-02 ENCOUNTER — Ambulatory Visit: Payer: Medicaid Other | Admitting: Obstetrics

## 2016-04-13 ENCOUNTER — Encounter (HOSPITAL_COMMUNITY): Payer: Self-pay | Admitting: *Deleted

## 2016-04-13 ENCOUNTER — Emergency Department (HOSPITAL_COMMUNITY)
Admission: EM | Admit: 2016-04-13 | Discharge: 2016-04-13 | Disposition: A | Discharge status: Home / Self Care | Payer: Medicaid Other | Attending: Emergency Medicine | Admitting: Emergency Medicine

## 2016-04-13 DIAGNOSIS — J45909 Unspecified asthma, uncomplicated: Secondary | ICD-10-CM | POA: Insufficient documentation

## 2016-04-13 DIAGNOSIS — F17210 Nicotine dependence, cigarettes, uncomplicated: Secondary | ICD-10-CM | POA: Diagnosis not present

## 2016-04-13 DIAGNOSIS — E119 Type 2 diabetes mellitus without complications: Secondary | ICD-10-CM | POA: Diagnosis not present

## 2016-04-13 DIAGNOSIS — R109 Unspecified abdominal pain: Secondary | ICD-10-CM | POA: Diagnosis present

## 2016-04-13 DIAGNOSIS — I10 Essential (primary) hypertension: Secondary | ICD-10-CM | POA: Insufficient documentation

## 2016-04-13 DIAGNOSIS — Z5321 Procedure and treatment not carried out due to patient leaving prior to being seen by health care provider: Secondary | ICD-10-CM | POA: Diagnosis not present

## 2016-04-13 DIAGNOSIS — Z7984 Long term (current) use of oral hypoglycemic drugs: Secondary | ICD-10-CM | POA: Diagnosis not present

## 2016-04-13 DIAGNOSIS — R112 Nausea with vomiting, unspecified: Secondary | ICD-10-CM | POA: Diagnosis not present

## 2016-04-13 LAB — COMPREHENSIVE METABOLIC PANEL
ALT: 22 U/L (ref 14–54)
AST: 26 U/L (ref 15–41)
Albumin: 4.3 g/dL (ref 3.5–5.0)
Alkaline Phosphatase: 65 U/L (ref 38–126)
Anion gap: 12 (ref 5–15)
BUN: 6 mg/dL (ref 6–20)
CO2: 21 mmol/L — ABNORMAL LOW (ref 22–32)
Calcium: 9.4 mg/dL (ref 8.9–10.3)
Chloride: 106 mmol/L (ref 101–111)
Creatinine, Ser: 0.79 mg/dL (ref 0.44–1.00)
GFR calc Af Amer: >60 mL/min (ref >60)
GFR calc non Af Amer: >60 mL/min (ref >60)
Glucose, Bld: 103 mg/dL — ABNORMAL HIGH (ref 65–99)
Potassium: 3.2 mmol/L — ABNORMAL LOW (ref 3.5–5.1)
Sodium: 139 mmol/L (ref 135–145)
Total Bilirubin: 0.7 mg/dL (ref 0.3–1.2)
Total Protein: 7.4 g/dL (ref 6.5–8.1)

## 2016-04-13 LAB — CBC
HCT: 40.3 % (ref 36.0–46.0)
Hemoglobin: 13.5 g/dL (ref 12.0–15.0)
MCH: 30.1 pg (ref 26.0–34.0)
MCHC: 33.5 g/dL (ref 30.0–36.0)
MCV: 90 fL (ref 78.0–100.0)
Platelets: 304 10*3/uL (ref 150–400)
RBC: 4.48 MIL/uL (ref 3.87–5.11)
RDW: 13.9 % (ref 11.5–15.5)
WBC: 8 10*3/uL (ref 4.0–10.5)

## 2016-04-13 LAB — LIPASE, BLOOD: Lipase: 27 U/L (ref 11–51)

## 2016-04-13 NOTE — ED Triage Notes (Signed)
C/o severe abd. Pain onset 2 weeks ago c/o nausea and vomiting .States "she hasn't eaten anything in 5 days.

## 2016-04-13 NOTE — ED Notes (Signed)
Called out Patient. Patient did not respond.

## 2016-04-13 NOTE — ED Notes (Addendum)
Lab called, urine will have to be recollected dure to sample with NO label

## 2016-04-13 NOTE — ED Triage Notes (Signed)
No answer x3

## 2016-05-16 ENCOUNTER — Telehealth: Payer: Self-pay | Admitting: *Deleted

## 2016-05-16 NOTE — Telephone Encounter (Signed)
Pt called in and wanted to know if something could be called in for yeast or BV has an annual scheduled but it is not for a few weeks, please advise.Marland KitchenMarland KitchenMarland Kitchen

## 2016-05-17 ENCOUNTER — Other Ambulatory Visit: Payer: Self-pay | Admitting: Obstetrics and Gynecology

## 2016-05-17 ENCOUNTER — Telehealth: Payer: Self-pay

## 2016-05-17 MED ORDER — METRONIDAZOLE 500 MG PO TABS: 500.0000 mg | ORAL_TABLET | Freq: Two times a day (BID) | ORAL | 0 refills | Status: AC

## 2016-05-17 MED ORDER — FLUCONAZOLE 150 MG PO TABS: 150.0000 mg | ORAL_TABLET | Freq: Once | ORAL | 0 refills | Status: AC

## 2016-05-17 NOTE — Telephone Encounter (Signed)
Patient called in requesting RX for yeast/bv infection. Per Dr. Jolayne Panther Rx's for Diflucan and flagyl called in over the phone to patient pharmacy. Called pt. And made her aware of rx at the pharmacy and pt. Verbalized understanding.

## 2016-05-17 NOTE — Telephone Encounter (Signed)
Patient should contact office first  

## 2016-06-02 ENCOUNTER — Ambulatory Visit: Payer: Medicaid Other | Admitting: Obstetrics

## 2016-07-06 ENCOUNTER — Ambulatory Visit: Payer: Medicaid Other | Admitting: Obstetrics

## 2016-07-13 NOTE — Telephone Encounter (Signed)
Pt canceled appt for exam. Call to be filed.

## 2016-08-03 ENCOUNTER — Encounter (HOSPITAL_COMMUNITY): Payer: Self-pay | Admitting: *Deleted

## 2016-08-03 ENCOUNTER — Emergency Department (HOSPITAL_COMMUNITY)
Admission: EM | Admit: 2016-08-03 | Discharge: 2016-08-03 | Disposition: A | Discharge status: Home / Self Care | Payer: Medicaid Other | Attending: Emergency Medicine | Admitting: Emergency Medicine

## 2016-08-03 DIAGNOSIS — F17210 Nicotine dependence, cigarettes, uncomplicated: Secondary | ICD-10-CM | POA: Insufficient documentation

## 2016-08-03 DIAGNOSIS — R0981 Nasal congestion: Secondary | ICD-10-CM | POA: Insufficient documentation

## 2016-08-03 DIAGNOSIS — I10 Essential (primary) hypertension: Secondary | ICD-10-CM | POA: Diagnosis not present

## 2016-08-03 DIAGNOSIS — Z79899 Other long term (current) drug therapy: Secondary | ICD-10-CM | POA: Diagnosis not present

## 2016-08-03 DIAGNOSIS — J45909 Unspecified asthma, uncomplicated: Secondary | ICD-10-CM | POA: Diagnosis not present

## 2016-08-03 DIAGNOSIS — H9203 Otalgia, bilateral: Secondary | ICD-10-CM | POA: Diagnosis present

## 2016-08-03 DIAGNOSIS — H6592 Unspecified nonsuppurative otitis media, left ear: Secondary | ICD-10-CM

## 2016-08-03 DIAGNOSIS — E119 Type 2 diabetes mellitus without complications: Secondary | ICD-10-CM | POA: Diagnosis not present

## 2016-08-03 DIAGNOSIS — H73892 Other specified disorders of tympanic membrane, left ear: Secondary | ICD-10-CM | POA: Diagnosis not present

## 2016-08-03 DIAGNOSIS — Z7984 Long term (current) use of oral hypoglycemic drugs: Secondary | ICD-10-CM | POA: Insufficient documentation

## 2016-08-03 LAB — POC URINE PREG, ED: Preg Test, Ur: NEGATIVE

## 2016-08-03 MED ORDER — ONDANSETRON 4 MG PO TBDP
4.0000 mg | ORAL_TABLET | Freq: Once | ORAL | Status: AC
Start: 2016-08-03 — End: 2016-08-03
  Administered 2016-08-03: 4 mg via ORAL
  Filled 2016-08-03: qty 1

## 2016-08-03 MED ORDER — LORATADINE 10 MG PO TABS: 10.0000 mg | ORAL_TABLET | Freq: Every day | ORAL | 0 refills | Status: DC

## 2016-08-03 MED ORDER — ACETAMINOPHEN 500 MG PO TABS
1000.0000 mg | ORAL_TABLET | Freq: Once | ORAL | Status: AC
  Administered 2016-08-03: 1000 mg via ORAL
  Filled 2016-08-03: qty 2

## 2016-08-03 MED ORDER — OXYMETAZOLINE HCL 0.05 % NA SOLN: 1.0000 | Freq: Two times a day (BID) | NASAL | 0 refills | Status: DC

## 2016-08-03 NOTE — ED Triage Notes (Signed)
Pain c/o earache and headache c 2 weeks . Also c/o n/v.

## 2016-08-03 NOTE — ED Provider Notes (Signed)
[facility]ENCY DEPT Provider Note   CSN: [id number] Arrival date & time: 08/03/16 0555     History    Chief Complaint  Patient presents with  . Otalgia  . Headache     HPI Stacy [name] is a 31 y.o. female.  31yo F w/ PMH including HTN, DM, asthma, who p/w otalgia and headache. Patient reports a 2 week history of bilateral ear pain left greater than right associated with intermittent headaches that come when the ear pain is bad. She has had 2 months of nasal congestion, runny nose, sore throat, and occasional cough. All of her children are currently ill with cold symptoms. She denies any fevers. 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 She has not taken any medications for her sx. No recent travel.    Past Medical History:  Diagnosis Date  . Asthma   . Diabetes mellitus without complication (HCC)   . Hypertension   . Medical history non-contributory   . UTI (urinary tract infection)      Patient Active Problem List   Diagnosis Date Noted  . Active labor 06/30/2013  . Short cervix affecting pregnancy 03/14/2013  . Other current maternal conditions classifiable elsewhere, antepartum 01/31/2013  . Supervision of other normal pregnancy 01/14/2013  . Insomnia 01/02/2013  . History of pilonidal cyst 03/17/2011    Past Surgical History:  Procedure Laterality Date  . CHOLECYSTECTOMY    . CYSTECTOMY    . DILITATION & CURRETTAGE/HYSTROSCOPY WITH HYDROTHERMAL ABLATION N/A 08/21/2015   Procedure: DILATATION & CURETTAGE/HYSTEROSCOPY WITH HYDROTHERMAL ABLATION;  Surgeon: Brock Bad, MD;  Location: [facility];  Service: Gynecology;  Laterality: N/A;  . PILONIDAL CYST EXCISION    . TUBAL LIGATION N/A 07/01/2013   Procedure: POST PARTUM TUBAL LIGATION;  Surgeon: Allie Bossier, MD;  Location: [facility];  Service: Gynecology;  Laterality: N/A;  . TUBAL LIGATION    . TUBAL  LIGATION N/A   . WISDOM TOOTH EXTRACTION      OB History    Gravida Para Term Preterm AB Living   4 3 3   1 3    SAB TAB Ectopic Multiple Live Births     1     3        Home Medications    Prior to Admission medications   Medication Sig Start Date End Date Taking? Authorizing Provider  albuterol (PROVENTIL HFA;VENTOLIN HFA) 108 (90 BASE) MCG/ACT inhaler Inhale 2 puffs into the lungs every 6 (six) hours as needed for wheezing. 06/26/13  Yes Deirdre C Poe, CNM  amitriptyline (ELAVIL) 25 MG tablet Take 25 mg by mouth at bedtime.   Yes Historical Provider, MD  carvedilol (COREG) 25 MG tablet Take 1 tablet (25 mg total) by mouth 2 (two) times daily with a meal. 08/21/15  Yes 10/19/15, MD  glipiZIDE (GLUCOTROL) 10 MG tablet Take 10 mg by mouth daily before breakfast.   Yes Historical Provider, MD  metFORMIN (GLUCOPHAGE) 500 MG tablet Take 500 mg by mouth 2 (two) times daily. 09/25/15  Yes Historical Provider, MD  mycophenolate (CELLCEPT) 500 MG tablet Take 1,000 mg by mouth 2 (two) times daily.  07/27/15  Yes Historical Provider, MD  omeprazole (PRILOSEC) 20 MG capsule Take 40 mg by mouth daily.    Yes Historical Provider, MD  QUEtiapine (SEROQUEL) 200 MG tablet Take 200 mg by mouth 2 (two) times daily. 08/02/15  Yes Historical Provider, MD  triamterene-hydrochlorothiazide (DYAZIDE) 37.5-25 MG capsule Take 1 each (1 capsule total) by mouth daily. 08/21/15  Yes Brock Bad, MD  clotrimazole (LOTRIMIN) 1 % cream Apply 1 application topically 2 (two) times daily. Patient not taking: Reported on 08/03/2016 09/17/15   Brock Bad, MD  dicyclomine (BENTYL) 20 MG tablet Take 1 tablet (20 mg total) by mouth 3 (three) times daily before meals. Patient not taking: Reported on 08/03/2016 07/03/15   Brock Bad, MD  HYDROcodone-acetaminophen (NORCO/VICODIN) 5-325 MG tablet Take 1-2 tablets by mouth every 6 (six) hours as needed for severe pain. Patient not taking: Reported on 08/03/2016  11/25/15   Ace Gins Sam, PA-C  ibuprofen (ADVIL,MOTRIN) 800 MG tablet Take 1 tablet (800 mg total) by mouth every 8 (eight) hours as needed. Patient not taking: Reported on 08/03/2016 09/17/15   Brock Bad, MD  potassium chloride 20 MEQ TBCR Take 40 mEq by mouth daily. Patient not taking: Reported on 08/03/2016 11/25/15   Ace Gins Sam, PA-C  promethazine (PHENERGAN) 25 MG suppository Place 1 suppository (25 mg total) rectally every 6 (six) hours as needed for nausea or vomiting. Patient not taking: Reported on 08/03/2016 11/25/15   Carlene Coria, PA-C      Family History  Problem Relation Age of Onset  . Alcohol abuse Neg Hx   . Arthritis Neg Hx   . Asthma Neg Hx   . Birth defects Neg Hx   . Cancer Neg Hx   . COPD Neg Hx   . Depression Neg Hx   . Diabetes Neg Hx   . Drug abuse Neg Hx   . Early death Neg Hx   . Hearing loss Neg Hx   . Heart disease Neg Hx   . Hyperlipidemia Neg Hx   . Hypertension Neg Hx   . Kidney disease Neg Hx   . Learning disabilities Neg Hx   . Mental illness Neg Hx   . Mental retardation Neg Hx   . Miscarriages / Stillbirths Neg Hx   . Stroke Neg Hx   . Vision loss Neg Hx      Social History  Substance Use Topics  . Smoking status: Current Every Day Smoker    Packs/day: 0.20    Types: Cigarettes  . Smokeless tobacco: Never Used     Comment: 4 CIGS A DAY- Stopped smoking when she had positive upt  . Alcohol use No     Allergies     Celebrex [celecoxib]    Review of Systems  10 Systems reviewed and are negative for acute change except as noted in the HPI.   Physical Exam Updated Vital Signs BP (!) 138/105   Pulse 108   Temp 98.3 F (36.8 C) (Oral)   Resp 16   Ht 5\' 5"  (1.651 m)   Wt 185 lb (83.9 kg)   SpO2 100%   BMI 30.79 kg/m   Physical Exam  Constitutional: She is oriented to person, place, and time. She appears well-developed and well-nourished. No distress.  HENT:  Head: Normocephalic.  Right Ear: Tympanic membrane and  ear canal normal.  Left Ear: Ear canal normal. A middle ear effusion is present.  Nose: Rhinorrhea present.  Mouth/Throat: Oropharynx is clear and moist. No oropharyngeal exudate.  Moist mucous membranes Nasal congestion  Eyes: Conjunctivae are normal. Pupils are equal, round, and reactive to light.  Neck: Normal range of motion. Neck supple.  Cardiovascular: Normal rate, regular rhythm and normal  heart sounds.   No murmur heard. Pulmonary/Chest: Effort normal and breath sounds normal.  Abdominal: Soft. Bowel sounds are normal. She exhibits no distension. There is no tenderness.  Musculoskeletal: She exhibits no edema.  Lymphadenopathy:    She has no cervical adenopathy.  Neurological: She is alert and oriented to person, place, and time.  Fluent speech  Skin: Skin is warm and dry.  Psychiatric: She has a normal mood and affect. Judgment normal.  Nursing note and vitals reviewed.     ED Treatments / Results  Labs (all labs ordered are listed, but only abnormal results are displayed) Labs Reviewed  POC URINE PREG, ED     EKG  EKG Interpretation  Date/Time:    Ventricular Rate:    PR Interval:    QRS Duration:   QT Interval:    QTC Calculation:   R Axis:     Text Interpretation:           Radiology No results found.  Procedures Procedures (including critical care time) Procedures  Medications Ordered in ED  Medications  ondansetron (ZOFRAN-ODT) disintegrating tablet 4 mg (4 mg Oral Given 08/03/16 0659)  acetaminophen (TYLENOL) tablet 1,000 mg (1,000 mg Oral Given 08/03/16 4599)     Initial Impression / Assessment and Plan / ED Course  I have reviewed the triage vital signs and the nursing notes.  Pertinent labs  that were available during my care of the patient were reviewed by me and considered in my medical decision making (see chart for details).  Clinical Course     Pt w/ 2weeks of otalgia L>R with associated headaches and 2 months of sore  throat, congestion, rhinorrhea. Well appearing on exam. VS notable for mild HTN, pt hasn't had meds yet today. No evidence of otitis media but she does have middle ear effusion. I suspect eustacian tube dysfunction 2/2 congestion. Recommended claritin and flonase, discussed brief use of Afrin for 2-3 days for nasal congestion. Cautioned on use of over-the-counter decongestants because of the patient's hypertension. Reviewed return precautions including worsening pain, drainage from ear, or fever. Patient voiced understanding and was discharged in satisfactory condition.  Final Clinical Impressions(s) / ED Diagnoses   Final diagnoses:  None     New Prescriptions   No medications on file       Laurence Spates, MD 08/03/16 [phone]

## 2016-08-03 NOTE — ED Notes (Signed)
ED Provider at bedside. 

## 2016-08-30 ENCOUNTER — Ambulatory Visit: Payer: Medicaid Other | Admitting: Obstetrics

## 2016-08-31 ENCOUNTER — Other Ambulatory Visit (HOSPITAL_COMMUNITY)
Admission: RE | Admit: 2016-08-31 | Discharge: 2016-08-31 | Disposition: A | Discharge status: Home / Self Care | Payer: Medicaid Other | Source: Ambulatory Visit | Attending: Obstetrics | Admitting: Obstetrics

## 2016-08-31 ENCOUNTER — Ambulatory Visit: Payer: Medicaid Other | Admitting: Obstetrics

## 2016-08-31 ENCOUNTER — Encounter: Payer: Self-pay | Admitting: Obstetrics

## 2016-08-31 VITALS — BP 152/85 | HR 102 | Wt 188.8 lb

## 2016-08-31 DIAGNOSIS — Z113 Encounter for screening for infections with a predominantly sexual mode of transmission: Secondary | ICD-10-CM | POA: Diagnosis present

## 2016-08-31 DIAGNOSIS — Z01419 Encounter for gynecological examination (general) (routine) without abnormal findings: Secondary | ICD-10-CM | POA: Insufficient documentation

## 2016-08-31 DIAGNOSIS — Z0000 Encounter for general adult medical examination without abnormal findings: Secondary | ICD-10-CM

## 2016-08-31 DIAGNOSIS — N939 Abnormal uterine and vaginal bleeding, unspecified: Secondary | ICD-10-CM

## 2016-08-31 DIAGNOSIS — Z1151 Encounter for screening for human papillomavirus (HPV): Secondary | ICD-10-CM | POA: Diagnosis present

## 2016-08-31 DIAGNOSIS — N946 Dysmenorrhea, unspecified: Secondary | ICD-10-CM

## 2016-08-31 DIAGNOSIS — Z124 Encounter for screening for malignant neoplasm of cervix: Secondary | ICD-10-CM

## 2016-08-31 DIAGNOSIS — Z3041 Encounter for surveillance of contraceptive pills: Secondary | ICD-10-CM | POA: Diagnosis not present

## 2016-08-31 DIAGNOSIS — Z23 Encounter for immunization: Secondary | ICD-10-CM | POA: Diagnosis not present

## 2016-08-31 DIAGNOSIS — N643 Galactorrhea not associated with childbirth: Secondary | ICD-10-CM

## 2016-08-31 DIAGNOSIS — E139 Other specified diabetes mellitus without complications: Secondary | ICD-10-CM

## 2016-08-31 MED ORDER — METRONIDAZOLE 500 MG PO TABS: 500.0000 mg | ORAL_TABLET | Freq: Two times a day (BID) | ORAL | 1 refills | Status: DC

## 2016-08-31 MED ORDER — MEDROXYPROGESTERONE ACETATE 150 MG/ML IM SUSP
150.0000 mg | Freq: Once | INTRAMUSCULAR | Status: AC
  Administered 2016-08-31: 150 mg via INTRAMUSCULAR

## 2016-08-31 MED ORDER — HYDROCODONE-ACETAMINOPHEN 10-325 MG PO TABS: 1.0000 | ORAL_TABLET | ORAL | 0 refills | Status: DC | PRN

## 2016-08-31 MED ORDER — DOXYCYCLINE HYCLATE 100 MG PO CAPS: 100.0000 mg | ORAL_CAPSULE | Freq: Two times a day (BID) | ORAL | 1 refills | Status: DC

## 2016-08-31 NOTE — Progress Notes (Signed)
Patient is in the office for annual exam. Patient has been having bleeding with cramping for 6 months- she had an ablation. Patient reports her husband has stepped out of her relationship.

## 2016-09-01 ENCOUNTER — Encounter: Payer: Self-pay | Admitting: Obstetrics

## 2016-09-01 LAB — COMPREHENSIVE METABOLIC PANEL
ALT: 23 IU/L (ref 0–32)
AST: 25 IU/L (ref 0–40)
Albumin/Globulin Ratio: 1.3 (ref 1.2–2.2)
Albumin: 4 g/dL (ref 3.5–5.5)
Alkaline Phosphatase: 76 IU/L (ref 39–117)
BUN/Creatinine Ratio: 7 — ABNORMAL LOW (ref 9–23)
BUN: 5 mg/dL — ABNORMAL LOW (ref 6–20)
Bilirubin Total: 0.3 mg/dL (ref 0.0–1.2)
CO2: 22 mmol/L (ref 18–29)
Calcium: 9 mg/dL (ref 8.7–10.2)
Chloride: 103 mmol/L (ref 96–106)
Creatinine, Ser: 0.69 mg/dL (ref 0.57–1.00)
GFR calc Af Amer: 133 mL/min/{1.73_m2} (ref >59)
GFR calc non Af Amer: 116 mL/min/{1.73_m2} (ref >59)
Globulin, Total: 3.1 g/dL (ref 1.5–4.5)
Glucose: 97 mg/dL (ref 65–99)
Potassium: 4.1 mmol/L (ref 3.5–5.2)
Sodium: 140 mmol/L (ref 134–144)
Total Protein: 7.1 g/dL (ref 6.0–8.5)

## 2016-09-01 LAB — CBC
Hematocrit: 34.8 % (ref 34.0–46.6)
Hemoglobin: 11.3 g/dL (ref 11.1–15.9)
MCH: 29 pg (ref 26.6–33.0)
MCHC: 32.5 g/dL (ref 31.5–35.7)
MCV: 90 fL (ref 79–97)
Platelets: 301 10*3/uL (ref 150–379)
RBC: 3.89 x10E6/uL (ref 3.77–5.28)
RDW: 15.1 % (ref 12.3–15.4)
WBC: 12.6 10*3/uL — ABNORMAL HIGH (ref 3.4–10.8)

## 2016-09-01 LAB — CYTOLOGY - PAP
Diagnosis: NEGATIVE
HPV: NOT DETECTED

## 2016-09-01 LAB — HEMOGLOBIN A1C
Est. average glucose Bld gHb Est-mCnc: 111 mg/dL
Hgb A1c MFr Bld: 5.5 % (ref 4.8–5.6)

## 2016-09-01 LAB — PROLACTIN: Prolactin: 27 ng/mL — ABNORMAL HIGH (ref 4.8–23.3)

## 2016-09-01 LAB — HEPATITIS B SURFACE ANTIGEN: Hepatitis B Surface Ag: NEGATIVE

## 2016-09-01 LAB — HEPATITIS C ANTIBODY: Hep C Virus Ab: <0.1 s/co ratio (ref 0.0–0.9)

## 2016-09-01 LAB — RPR: RPR Ser Ql: NONREACTIVE

## 2016-09-01 LAB — HIV ANTIBODY (ROUTINE TESTING W REFLEX): HIV Screen 4th Generation wRfx: NONREACTIVE

## 2016-09-01 LAB — TSH: TSH: 0.493 u[IU]/mL (ref 0.450–4.500)

## 2016-09-01 NOTE — Progress Notes (Signed)
Subjective:        Stacy [name] is a 31 y.o. female here for a routine exam.  Current complaints: AUB for past 6 months.  S/P HTA Endometrial Ablation.  Suspect infidelity from husband .    Personal health questionnaire:  Is patient Ashkenazi Jewish, have a family history of breast and/or ovarian cancer: no Is there a family history of uterine cancer diagnosed at age < 18, gastrointestinal cancer, urinary tract cancer, family member who is a Personnel officer syndrome-associated carrier: no Is the patient overweight and hypertensive, family history of diabetes, personal history of gestational diabetes, preeclampsia or PCOS: no Is patient over 27, have PCOS,  family history of premature CHD under age 33, diabetes, smoke, have hypertension or peripheral artery disease:  no At any time, has a partner hit, kicked or otherwise hurt or frightened you?: no Over the past 2 weeks, have you felt down, depressed or hopeless?: no Over the past 2 weeks, have you felt little interest or pleasure in doing things?:no   Gynecologic History Patient's last menstrual period was 08/13/2016. Contraception: tubal ligation Last Pap: 2016. Results were: normal Last mammogram: n/a. Results were: n/a  Obstetric History OB History  Gravida Para Term Preterm AB Living  4 3 3   1 3   SAB TAB Ectopic Multiple Live Births    1     3    # Outcome Date GA Lbr Len/2nd Weight Sex Delivery Anes PTL Lv  4 Term 07/01/13 [redacted]w[redacted]d 20:50 / 00:12 6 lb 3.5 oz (2.82 kg) M Vag-Spont EPI  LIV     Birth Comments: extra digit on left hand  3 Term 10/29/09 [redacted]w[redacted]d  7 lb 3 oz (3.26 kg) F Vag-Spont EPI  LIV  2 Term 01/02/06 [redacted]w[redacted]d  7 lb 2 oz (3.232 kg) F Vag-Spont EPI N LIV  1 TAB               Past Medical History:  Diagnosis Date  . Anxiety   . Asthma   . Depression   . Diabetes mellitus without complication (HCC)   . Hypertension   . Medical history non-contributory   . UTI (urinary tract infection)   . Uveitis     Past  Surgical History:  Procedure Laterality Date  . CHOLECYSTECTOMY    . CYSTECTOMY    . DILITATION & CURRETTAGE/HYSTROSCOPY WITH HYDROTHERMAL ABLATION N/A 08/21/2015   Procedure: DILATATION & CURETTAGE/HYSTEROSCOPY WITH HYDROTHERMAL ABLATION;  Surgeon: 10/19/2015, MD;  Location: [facility];  Service: Gynecology;  Laterality: N/A;  . EYE SURGERY    . PILONIDAL CYST EXCISION    . TUBAL LIGATION N/A 07/01/2013   Procedure: POST PARTUM TUBAL LIGATION;  Surgeon: 07/03/2013, MD;  Location: [facility];  Service: Gynecology;  Laterality: N/A;  . TUBAL LIGATION    . TUBAL LIGATION N/A   . WISDOM TOOTH EXTRACTION       Current Outpatient Prescriptions:  .  albuterol (PROVENTIL HFA;VENTOLIN HFA) 108 (90 BASE) MCG/ACT inhaler, Inhale 2 puffs into the lungs every 6 (six) hours as needed for wheezing., Disp: 1 Inhaler, Rfl: 2 .  amitriptyline (ELAVIL) 25 MG tablet, Take 25 mg by mouth at bedtime., Disp: , Rfl:  .  carvedilol (COREG) 25 MG tablet, Take 1 tablet (25 mg total) by mouth 2 (two) times daily with a meal., Disp: 60 tablet, Rfl: 11 .  glipiZIDE (GLUCOTROL) 10 MG tablet, Take 10 mg by mouth daily before breakfast., Disp: , Rfl:  .  ibuprofen (ADVIL,MOTRIN) 800 MG tablet, Take 1 tablet (800 mg total) by mouth every 8 (eight) hours as needed., Disp: 30 tablet, Rfl: 5 .  loratadine (CLARITIN) 10 MG tablet, Take 1 tablet (10 mg total) by mouth daily., Disp: 30 tablet, Rfl: 0 .  metFORMIN (GLUCOPHAGE) 500 MG tablet, Take 500 mg by mouth 2 (two) times daily., Disp: , Rfl: 0 .  mycophenolate (CELLCEPT) 500 MG tablet, Take 1,000 mg by mouth 2 (two) times daily. , Disp: , Rfl: 3 .  omeprazole (PRILOSEC) 20 MG capsule, Take 40 mg by mouth daily. , Disp: , Rfl:  .  oxymetazoline (AFRIN NASAL SPRAY) 0.05 % nasal spray, Place 1 spray into both nostrils 2 (two) times daily. For 2-3 days then discontinue, Disp: 14.7 mL, Rfl: 0 .  triamterene-hydrochlorothiazide (DYAZIDE) 37.5-25 MG capsule, Take 1 each (1 capsule  total) by mouth daily., Disp: 30 capsule, Rfl: 11 .  doxycycline (VIBRAMYCIN) 100 MG capsule, Take 1 capsule (100 mg total) by mouth 2 (two) times daily., Disp: 28 capsule, Rfl: 1 .  HYDROcodone-acetaminophen (NORCO) 10-325 MG tablet, Take 1 tablet by mouth every 4 (four) hours as needed., Disp: 30 tablet, Rfl: 0 .  metroNIDAZOLE (FLAGYL) 500 MG tablet, Take 1 tablet (500 mg total) by mouth 2 (two) times daily., Disp: 28 tablet, Rfl: 1 Allergies  Allergen Reactions  . Celebrex [Celecoxib] Hives    Social History  Substance Use Topics  . Smoking status: Current Every Day Smoker    Packs/day: 0.20    Types: Cigarettes  . Smokeless tobacco: Never Used     Comment: 2 CIGS A DAY- Stopped smoking when she had positive upt  . Alcohol use No    Family History  Problem Relation Age of Onset  . Alcohol abuse Neg Hx   . Arthritis Neg Hx   . Asthma Neg Hx   . Birth defects Neg Hx   . Cancer Neg Hx   . COPD Neg Hx   . Depression Neg Hx   . Diabetes Neg Hx   . Drug abuse Neg Hx   . Early death Neg Hx   . Hearing loss Neg Hx   . Heart disease Neg Hx   . Hyperlipidemia Neg Hx   . Hypertension Neg Hx   . Kidney disease Neg Hx   . Learning disabilities Neg Hx   . Mental illness Neg Hx   . Mental retardation Neg Hx   . Miscarriages / Stillbirths Neg Hx   . Stroke Neg Hx   . Vision loss Neg Hx       Review of Systems  Constitutional: negative for fatigue and weight loss Respiratory: negative for cough and wheezing Cardiovascular: negative for chest pain, fatigue and palpitations Gastrointestinal: negative for abdominal pain and change in bowel habits Musculoskeletal:negative for myalgias Neurological: negative for gait problems and tremors Behavioral/Psych: negative for abusive relationship, depression Endocrine: negative for temperature intolerance    Genitourinary:positive for abnormal menstrual periods Integument/breast: positive for nipple discharge     Objective:       BP  (!) 152/85   Pulse (!) 102   Wt 188 lb 12.8 oz (85.6 kg)   LMP 08/13/2016   BMI 31.42 kg/m  General:   alert  Skin:   no rash or abnormalities  Lungs:   clear to auscultation bilaterally  Heart:   regular rate and rhythm, S1, S2 normal, no murmur, click, rub or gallop  Breasts:   normal without suspicious masses, skin or  nipple changes or axillary nodes  Abdomen:  normal findings: no organomegaly, soft, non-tender and no hernia  Pelvis:  External genitalia: normal general appearance Urinary system: urethral meatus normal and bladder without fullness, nontender Vaginal: normal without tenderness, induration or masses Cervix: normal appearance Adnexa: normal bimanual exam Uterus: anteverted and non-tender, normal size   Lab Review Urine pregnancy test Labs reviewed yes Radiologic studies reviewed yes  50% of 20 min visit spent on counseling and coordination of care.    Assessment:    Healthy female exam.    AUB  Dysmenorrhea  Galactorrhea    Plan:    Wet prep and cultures done  Prolactin ordered  Referred to Endocrinology for Diabetes management  Education reviewed: calcium supplements, depression evaluation, low fat, low cholesterol diet, safe sex/STD prevention, self breast exams and weight bearing exercise. Contraception: tubal ligation. Follow up in: 2 weeks.   Meds ordered this encounter  Medications  . HYDROcodone-acetaminophen (NORCO) 10-325 MG tablet    Sig: Take 1 tablet by mouth every 4 (four) hours as needed.    Dispense:  30 tablet    Refill:  0  . doxycycline (VIBRAMYCIN) 100 MG capsule    Sig: Take 1 capsule (100 mg total) by mouth 2 (two) times daily.    Dispense:  28 capsule    Refill:  1  . metroNIDAZOLE (FLAGYL) 500 MG tablet    Sig: Take 1 tablet (500 mg total) by mouth 2 (two) times daily.    Dispense:  28 tablet    Refill:  1  . medroxyPROGESTERone (DEPO-PROVERA) injection 150 mg   Orders Placed This Encounter  Procedures  . Flu Vaccine  QUAD 36+ mos IM (Fluarix, Quad PF)  . Prolactin  . Hemoglobin A1c  . Comprehensive metabolic panel  . TSH  . CBC  . HIV antibody  . Hepatitis B surface antigen  . RPR  . Hepatitis C antibody  . Ambulatory referral to Endocrinology    Referral Priority:   Routine    Referral Type:   Consultation    Referral Reason:   Specialty Services Required    Number of Visits Requested:   1      Patient ID: [name], female   DOB: 1986/01/21, 31 y.o.   MRN: [id number]

## 2016-09-02 LAB — CERVICOVAGINAL ANCILLARY ONLY
Bacterial vaginitis: POSITIVE — AB
Candida vaginitis: POSITIVE — AB
Chlamydia: NEGATIVE
Neisseria Gonorrhea: NEGATIVE
Trichomonas: NEGATIVE

## 2016-09-03 ENCOUNTER — Other Ambulatory Visit: Payer: Self-pay | Admitting: Obstetrics

## 2016-09-03 DIAGNOSIS — B373 Candidiasis of vulva and vagina: Secondary | ICD-10-CM

## 2016-09-03 DIAGNOSIS — B3731 Acute candidiasis of vulva and vagina: Secondary | ICD-10-CM

## 2016-09-03 MED ORDER — FLUCONAZOLE 150 MG PO TABS: 150.0000 mg | ORAL_TABLET | Freq: Once | ORAL | 0 refills | Status: AC

## 2016-09-08 ENCOUNTER — Telehealth: Payer: Self-pay

## 2016-09-08 DIAGNOSIS — B379 Candidiasis, unspecified: Secondary | ICD-10-CM

## 2016-09-08 MED ORDER — FLUCONAZOLE 150 MG PO TABS: 150.0000 mg | ORAL_TABLET | Freq: Once | ORAL | 0 refills | Status: AC

## 2016-09-08 NOTE — Telephone Encounter (Signed)
Returned call, patient states that she is having itching and burning after taking Flagyl and would like rx for yeast. Sent rx with provider approval.

## 2016-09-15 ENCOUNTER — Ambulatory Visit: Payer: Medicaid Other | Admitting: Obstetrics

## 2017-01-14 ENCOUNTER — Encounter (HOSPITAL_BASED_OUTPATIENT_CLINIC_OR_DEPARTMENT_OTHER): Payer: Self-pay | Admitting: *Deleted

## 2017-01-14 ENCOUNTER — Emergency Department (HOSPITAL_BASED_OUTPATIENT_CLINIC_OR_DEPARTMENT_OTHER)
Admission: EM | Admit: 2017-01-14 | Discharge: 2017-01-14 | Disposition: A | Discharge status: Home / Self Care | Payer: Medicaid Other | Attending: Emergency Medicine | Admitting: Emergency Medicine

## 2017-01-14 DIAGNOSIS — J45909 Unspecified asthma, uncomplicated: Secondary | ICD-10-CM | POA: Diagnosis not present

## 2017-01-14 DIAGNOSIS — R112 Nausea with vomiting, unspecified: Secondary | ICD-10-CM

## 2017-01-14 DIAGNOSIS — F17210 Nicotine dependence, cigarettes, uncomplicated: Secondary | ICD-10-CM | POA: Insufficient documentation

## 2017-01-14 DIAGNOSIS — R197 Diarrhea, unspecified: Secondary | ICD-10-CM | POA: Diagnosis not present

## 2017-01-14 DIAGNOSIS — B349 Viral infection, unspecified: Secondary | ICD-10-CM | POA: Insufficient documentation

## 2017-01-14 DIAGNOSIS — Z7984 Long term (current) use of oral hypoglycemic drugs: Secondary | ICD-10-CM | POA: Insufficient documentation

## 2017-01-14 DIAGNOSIS — I10 Essential (primary) hypertension: Secondary | ICD-10-CM | POA: Diagnosis not present

## 2017-01-14 DIAGNOSIS — E119 Type 2 diabetes mellitus without complications: Secondary | ICD-10-CM | POA: Insufficient documentation

## 2017-01-14 DIAGNOSIS — Z79899 Other long term (current) drug therapy: Secondary | ICD-10-CM | POA: Diagnosis not present

## 2017-01-14 HISTORY — DX: Unspecified osteoarthritis, unspecified site: M19.90

## 2017-01-14 LAB — URINALYSIS, ROUTINE W REFLEX MICROSCOPIC
Bilirubin Urine: NEGATIVE
Glucose, UA: NEGATIVE mg/dL
Hgb urine dipstick: NEGATIVE
Ketones, ur: 15 mg/dL — AB
Leukocytes, UA: NEGATIVE
Nitrite: NEGATIVE
Protein, ur: NEGATIVE mg/dL
Specific Gravity, Urine: 1.029 (ref 1.005–1.030)
pH: 6 (ref 5.0–8.0)

## 2017-01-14 LAB — COMPREHENSIVE METABOLIC PANEL
ALT: 41 U/L (ref 14–54)
AST: 33 U/L (ref 15–41)
Albumin: 3.5 g/dL (ref 3.5–5.0)
Alkaline Phosphatase: 70 U/L (ref 38–126)
Anion gap: 7 (ref 5–15)
BUN: 9 mg/dL (ref 6–20)
CO2: 24 mmol/L (ref 22–32)
Calcium: 8.6 mg/dL — ABNORMAL LOW (ref 8.9–10.3)
Chloride: 107 mmol/L (ref 101–111)
Creatinine, Ser: 0.95 mg/dL (ref 0.44–1.00)
GFR calc Af Amer: >60 mL/min (ref >60)
GFR calc non Af Amer: >60 mL/min (ref >60)
Glucose, Bld: 105 mg/dL — ABNORMAL HIGH (ref 65–99)
Potassium: 3.2 mmol/L — ABNORMAL LOW (ref 3.5–5.1)
Sodium: 138 mmol/L (ref 135–145)
Total Bilirubin: 0.5 mg/dL (ref 0.3–1.2)
Total Protein: 7 g/dL (ref 6.5–8.1)

## 2017-01-14 LAB — CBC WITH DIFFERENTIAL/PLATELET
Basophils Absolute: 0 10*3/uL (ref 0.0–0.1)
Basophils Relative: 0 %
Eosinophils Absolute: 0.2 10*3/uL (ref 0.0–0.7)
Eosinophils Relative: 2 %
HCT: 32.5 % — ABNORMAL LOW (ref 36.0–46.0)
Hemoglobin: 11.2 g/dL — ABNORMAL LOW (ref 12.0–15.0)
Lymphocytes Relative: 31 %
Lymphs Abs: 2.8 10*3/uL (ref 0.7–4.0)
MCH: 30.9 pg (ref 26.0–34.0)
MCHC: 34.5 g/dL (ref 30.0–36.0)
MCV: 89.5 fL (ref 78.0–100.0)
Monocytes Absolute: 1.2 10*3/uL — ABNORMAL HIGH (ref 0.1–1.0)
Monocytes Relative: 13 %
Neutro Abs: 4.8 10*3/uL (ref 1.7–7.7)
Neutrophils Relative %: 54 %
Platelets: 259 10*3/uL (ref 150–400)
RBC: 3.63 MIL/uL — ABNORMAL LOW (ref 3.87–5.11)
RDW: 13.3 % (ref 11.5–15.5)
WBC: 9 10*3/uL (ref 4.0–10.5)

## 2017-01-14 LAB — RAPID STREP SCREEN (MED CTR MEBANE ONLY): Streptococcus, Group A Screen (Direct): NEGATIVE

## 2017-01-14 LAB — PREGNANCY, URINE: Preg Test, Ur: NEGATIVE

## 2017-01-14 MED ORDER — ONDANSETRON HCL 4 MG PO TABS: 4.0000 mg | ORAL_TABLET | Freq: Three times a day (TID) | ORAL | 0 refills | Status: DC | PRN

## 2017-01-14 MED ORDER — ONDANSETRON HCL 4 MG/2ML IJ SOLN
4.0000 mg | Freq: Once | INTRAMUSCULAR | Status: AC
  Administered 2017-01-14: 4 mg via INTRAVENOUS
  Filled 2017-01-14: qty 2

## 2017-01-14 MED ORDER — SODIUM CHLORIDE 0.9 % IV BOLUS (SEPSIS)
1000.0000 mL | Freq: Once | INTRAVENOUS | Status: AC
  Administered 2017-01-14: 1000 mL via INTRAVENOUS

## 2017-01-14 MED ORDER — DICYCLOMINE HCL 10 MG PO CAPS
10.0000 mg | ORAL_CAPSULE | Freq: Once | ORAL | Status: AC
  Administered 2017-01-14: 10 mg via ORAL
  Filled 2017-01-14: qty 1

## 2017-01-14 NOTE — ED Notes (Signed)
Pt given cup to attempt urine sample. 

## 2017-01-14 NOTE — ED Provider Notes (Signed)
[facility]-EMERGENCY DEPT Provider Note   CSN: [id number] Arrival date & time: 01/14/17  2122  By signing my name below, I, Thelma Barge, attest that this documentation has been prepared under the direction and in the presence of No att. providers found. Electronically Signed: Thelma Barge, Scribe. 01/14/17. 10:48 PM.  History   Chief Complaint Chief Complaint  Patient presents with  . Emesis   The history is provided by the patient. No language interpreter was used.   HPI Comments: Stacy [name] is a 31 y.o. female with PMHx DM, HTN, and auto immune disease who presents to the Emergency Department complaining of constant, gradually worsening fever since today. She has associated sore throat, nausea, vomiting, diarrhea, cough, abdominal pain that radiates to her back, and melena. Her son is also currently being seen and has had similar symptoms for 1 week. She has taken ibuprofen and tylenol with mild to no relief. She lives in a shelter but reports possible contact with sick people. She denies dysuria. She denies traveling, pets, and taking pepto bismol. She takes prednisone daily for her chronic auto immune disorder.   Past Medical History:  Diagnosis Date  . Anxiety   . Arthritis    rheumatoid  . Asthma   . Depression   . Diabetes mellitus without complication (HCC)   . Hypertension   . Medical history non-contributory   . UTI (urinary tract infection)   . Uveitis     Patient Active Problem List   Diagnosis Date Noted  . Active labor 06/30/2013  . Short cervix affecting pregnancy 03/14/2013  . Other current maternal conditions classifiable elsewhere, antepartum 01/31/2013  . Supervision of other normal pregnancy 01/14/2013  . Insomnia 01/02/2013  . History of pilonidal cyst 03/17/2011    Past Surgical History:  Procedure Laterality Date  . CHOLECYSTECTOMY    . CYSTECTOMY    . DILITATION & CURRETTAGE/HYSTROSCOPY WITH HYDROTHERMAL ABLATION N/A 08/21/2015   Procedure:  DILATATION & CURETTAGE/HYSTEROSCOPY WITH HYDROTHERMAL ABLATION;  Surgeon: Brock Bad, MD;  Location: [facility];  Service: Gynecology;  Laterality: N/A;  . EYE SURGERY    . PILONIDAL CYST EXCISION    . TUBAL LIGATION N/A 07/01/2013   Procedure: POST PARTUM TUBAL LIGATION;  Surgeon: Allie Bossier, MD;  Location: [facility];  Service: Gynecology;  Laterality: N/A;  . TUBAL LIGATION    . TUBAL LIGATION N/A   . WISDOM TOOTH EXTRACTION      OB History    Gravida Para Term Preterm AB Living   4 3 3   1 3    SAB TAB Ectopic Multiple Live Births     1     3       Home Medications    Prior to Admission medications   Medication Sig Start Date End Date Taking? Authorizing Provider  albuterol (PROVENTIL HFA;VENTOLIN HFA) 108 (90 BASE) MCG/ACT inhaler Inhale 2 puffs into the lungs every 6 (six) hours as needed for wheezing. 06/26/13   Poe, Deirdre C, CNM  amitriptyline (ELAVIL) 25 MG tablet Take 25 mg by mouth at bedtime.    [provider]  carvedilol (COREG) 25 MG tablet Take 1 tablet (25 mg total) by mouth 2 (two) times daily with a meal. 08/21/15   Brock Bad, MD  doxycycline (VIBRAMYCIN) 100 MG capsule Take 1 capsule (100 mg total) by mouth 2 (two) times daily. 08/31/16   Brock Bad, MD  glipiZIDE (GLUCOTROL) 10 MG tablet Take 10 mg by mouth daily before  breakfast.    [provider]  HYDROcodone-acetaminophen (NORCO) 10-325 MG tablet Take 1 tablet by mouth every 4 (four) hours as needed. 08/31/16   Brock Bad, MD  ibuprofen (ADVIL,MOTRIN) 800 MG tablet Take 1 tablet (800 mg total) by mouth every 8 (eight) hours as needed. 09/17/15   Brock Bad, MD  loratadine (CLARITIN) 10 MG tablet Take 1 tablet (10 mg total) by mouth daily. 08/03/16   Little, Ambrose Finland, MD  metFORMIN (GLUCOPHAGE) 500 MG tablet Take 500 mg by mouth 2 (two) times daily. 09/25/15   [provider]  metroNIDAZOLE (FLAGYL) 500 MG tablet Take 1 tablet (500 mg total) by mouth 2 (two)  times daily. 08/31/16   Brock Bad, MD  mycophenolate (CELLCEPT) 500 MG tablet Take 1,000 mg by mouth 2 (two) times daily.  07/27/15   [provider]  omeprazole (PRILOSEC) 20 MG capsule Take 40 mg by mouth daily.     [provider]  ondansetron (ZOFRAN) 4 MG tablet Take 1 tablet (4 mg total) by mouth every 8 (eight) hours as needed for nausea or vomiting. 01/14/17   Tilden Fossa, MD  oxymetazoline [facility] NASAL SPRAY) 0.05 % nasal spray Place 1 spray into both nostrils 2 (two) times daily. For 2-3 days then discontinue 08/03/16   Little, Ambrose Finland, MD  triamterene-hydrochlorothiazide (DYAZIDE) 37.5-25 MG capsule Take 1 each (1 capsule total) by mouth daily. 08/21/15   Brock Bad, MD    Family History Family History  Problem Relation Age of Onset  . Alcohol abuse Neg Hx   . Arthritis Neg Hx   . Asthma Neg Hx   . Birth defects Neg Hx   . Cancer Neg Hx   . COPD Neg Hx   . Depression Neg Hx   . Diabetes Neg Hx   . Drug abuse Neg Hx   . Early death Neg Hx   . Hearing loss Neg Hx   . Heart disease Neg Hx   . Hyperlipidemia Neg Hx   . Hypertension Neg Hx   . Kidney disease Neg Hx   . Learning disabilities Neg Hx   . Mental illness Neg Hx   . Mental retardation Neg Hx   . Miscarriages / Stillbirths Neg Hx   . Stroke Neg Hx   . Vision loss Neg Hx     Social History Social History  Substance Use Topics  . Smoking status: Current Every Day Smoker    Packs/day: 0.20    Types: Cigarettes  . Smokeless tobacco: Never Used     Comment: 2 CIGS A DAY- Stopped smoking when she had positive upt  . Alcohol use No     Allergies   Celebrex [celecoxib]   Review of Systems Review of Systems  Constitutional: Positive for fever.  HENT: Positive for sore throat.   Respiratory: Positive for cough.   Gastrointestinal: Positive for abdominal pain, diarrhea, nausea and vomiting.  Genitourinary: Negative for dysuria.  Musculoskeletal: Positive for back pain.    All other systems reviewed and are negative.    Physical Exam Updated Vital Signs BP 130/88 (BP Location: Right Arm)   Pulse 88   Temp 98.4 F (36.9 C) (Oral)   Resp 20   Ht 5\' 4"  (1.626 m)   Wt 78.2 kg (172 lb 8 oz)   SpO2 100%   BMI 29.61 kg/m   Physical Exam  Constitutional: She is oriented to person, place, and time. She appears well-developed and well-nourished.  HENT:  Head: Normocephalic and atraumatic.  Erythema in posterior oropharynx  Cardiovascular: Regular rhythm.   No murmur heard. Tachycardic  Pulmonary/Chest: Effort normal and breath sounds normal. No respiratory distress.  Abdominal: Soft. There is no tenderness. There is no rebound and no guarding.  Mild, diffuse abdominal tenderness  Musculoskeletal: She exhibits no edema or tenderness.  Neurological: She is alert and oriented to person, place, and time.  Skin: Skin is warm and dry.  Psychiatric: She has a normal mood and affect. Her behavior is normal.  Nursing note and vitals reviewed.    ED Treatments / Results  DIAGNOSTIC STUDIES: Oxygen Saturation is 98% on RA, normal by my interpretation.    COORDINATION OF CARE: 10:26 PM Discussed treatment plan with pt at bedside and pt agreed to plan.  Labs (all labs ordered are listed, but only abnormal results are displayed) Labs Reviewed  COMPREHENSIVE METABOLIC PANEL - Abnormal; Notable for the following:       Result Value   Potassium 3.2 (*)    Glucose, Bld 105 (*)    Calcium 8.6 (*)    All other components within normal limits  CBC WITH DIFFERENTIAL/PLATELET - Abnormal; Notable for the following:    RBC 3.63 (*)    Hemoglobin 11.2 (*)    HCT 32.5 (*)    Monocytes Absolute 1.2 (*)    All other components within normal limits  URINALYSIS, ROUTINE W REFLEX MICROSCOPIC - Abnormal; Notable for the following:    Ketones, ur 15 (*)    All other components within normal limits  RAPID STREP SCREEN (NOT AT [facility])  CULTURE, GROUP A STREP [facility])   PREGNANCY, URINE    EKG  EKG Interpretation None       Radiology No results found.  Procedures Procedures (including critical care time)  Medications Ordered in ED Medications  sodium chloride 0.9 % bolus 1,000 mL (0 mLs Intravenous Stopped 01/14/17 2341)  ondansetron (ZOFRAN) injection 4 mg (4 mg Intravenous Given 01/14/17 2257)  dicyclomine (BENTYL) capsule 10 mg (10 mg Oral Given 01/14/17 2341)     Initial Impression / Assessment and Plan / ED Course  I have reviewed the triage vital signs and the nursing notes.  Pertinent labs & imaging results that were available during my care of the patient were reviewed by me and considered in my medical decision making (see chart for details).     Patient here for multiple complaints, nontoxic on examination. No evidence of serious bacterial infection. Discussed with patient likely viral process. Discussed home care, outpatient follow up and return precautions.  Final Clinical Impressions(s) / ED Diagnoses   Final diagnoses:  Nausea vomiting and diarrhea  Viral illness    New Prescriptions Discharge Medication List as of 01/14/2017 11:33 PM    START taking these medications   Details  ondansetron (ZOFRAN) 4 MG tablet Take 1 tablet (4 mg total) by mouth every 8 (eight) hours as needed for nausea or vomiting., Starting Sat 01/14/2017, Print      I personally performed the services described in this documentation, which was scribed in my presence. The recorded information has been reviewed and is accurate.    Tilden Fossa, MD 01/24/17 [phone]

## 2017-01-14 NOTE — ED Triage Notes (Signed)
Pt reports nausea, vomiting and diarrhea for about 2 days. Temp 102 today, body aches, sore throat and cough. Last took ibuprofen about 1 hour ago.

## 2017-01-14 NOTE — ED Notes (Signed)
Pt given d/c instructions as per chart. Rx x 1. Verbalizes understanding. No questions. 

## 2017-01-17 LAB — CULTURE, GROUP A STREP (THRC)

## 2017-03-02 ENCOUNTER — Ambulatory Visit: Payer: Medicaid Other | Admitting: Obstetrics

## 2017-04-11 ENCOUNTER — Encounter: Payer: Self-pay | Admitting: Obstetrics

## 2017-04-11 ENCOUNTER — Ambulatory Visit (INDEPENDENT_AMBULATORY_CARE_PROVIDER_SITE_OTHER): Payer: Medicaid Other | Admitting: Obstetrics

## 2017-04-11 ENCOUNTER — Other Ambulatory Visit (HOSPITAL_COMMUNITY)
Admission: RE | Admit: 2017-04-11 | Discharge: 2017-04-11 | Disposition: A | Discharge status: Home / Self Care | Payer: Medicaid Other | Source: Ambulatory Visit | Attending: Obstetrics | Admitting: Obstetrics

## 2017-04-11 VITALS — BP 132/90 | HR 81 | Wt 175.0 lb

## 2017-04-11 DIAGNOSIS — N946 Dysmenorrhea, unspecified: Secondary | ICD-10-CM

## 2017-04-11 DIAGNOSIS — F329 Major depressive disorder, single episode, unspecified: Secondary | ICD-10-CM | POA: Insufficient documentation

## 2017-04-11 DIAGNOSIS — E6609 Other obesity due to excess calories: Secondary | ICD-10-CM | POA: Diagnosis not present

## 2017-04-11 DIAGNOSIS — J45909 Unspecified asthma, uncomplicated: Secondary | ICD-10-CM | POA: Diagnosis not present

## 2017-04-11 DIAGNOSIS — E669 Obesity, unspecified: Secondary | ICD-10-CM | POA: Insufficient documentation

## 2017-04-11 DIAGNOSIS — E139 Other specified diabetes mellitus without complications: Secondary | ICD-10-CM

## 2017-04-11 DIAGNOSIS — N898 Other specified noninflammatory disorders of vagina: Secondary | ICD-10-CM | POA: Insufficient documentation

## 2017-04-11 DIAGNOSIS — I10 Essential (primary) hypertension: Secondary | ICD-10-CM | POA: Insufficient documentation

## 2017-04-11 DIAGNOSIS — N939 Abnormal uterine and vaginal bleeding, unspecified: Secondary | ICD-10-CM | POA: Diagnosis not present

## 2017-04-11 DIAGNOSIS — Z6830 Body mass index (BMI) 30.0-30.9, adult: Secondary | ICD-10-CM

## 2017-04-11 DIAGNOSIS — M069 Rheumatoid arthritis, unspecified: Secondary | ICD-10-CM | POA: Diagnosis not present

## 2017-04-11 DIAGNOSIS — E119 Type 2 diabetes mellitus without complications: Secondary | ICD-10-CM | POA: Diagnosis not present

## 2017-04-11 DIAGNOSIS — F419 Anxiety disorder, unspecified: Secondary | ICD-10-CM | POA: Insufficient documentation

## 2017-04-11 DIAGNOSIS — E109 Type 1 diabetes mellitus without complications: Secondary | ICD-10-CM | POA: Diagnosis not present

## 2017-04-11 MED ORDER — IBUPROFEN 800 MG PO TABS: 800.0000 mg | ORAL_TABLET | Freq: Three times a day (TID) | ORAL | 5 refills | Status: AC | PRN

## 2017-04-11 MED ORDER — HYDROCODONE-ACETAMINOPHEN 10-325 MG PO TABS: 1.0000 | ORAL_TABLET | Freq: Four times a day (QID) | ORAL | 0 refills | Status: DC | PRN

## 2017-04-11 MED ORDER — FLUCONAZOLE 200 MG PO TABS: 200.0000 mg | ORAL_TABLET | ORAL | 2 refills | Status: DC

## 2017-04-11 NOTE — Progress Notes (Signed)
Patient ID: Stacy [name], female   DOB: 1986-05-24, 31 y.o.   MRN: [id number]  Chief Complaint  Patient presents with  . Gynecologic Exam    HPI Stacy [name] is a 31 y.o. female.  Heavy, prolonged and painful periods.  HTA endometrial ablation done in 2017 and she did well for awhile but the heavy bleeding and pain returned this year.  She had had tubal sterilization and does not desire future fertility.  Patient is requesting definitive surgical management. HPI  Past Medical History:  Diagnosis Date  . Anxiety   . Arthritis    rheumatoid  . Asthma   . Depression   . Diabetes mellitus without complication (HCC)   . Hypertension   . Medical history non-contributory   . UTI (urinary tract infection)   . Uveitis     Past Surgical History:  Procedure Laterality Date  . CHOLECYSTECTOMY    . CYSTECTOMY    . DILITATION & CURRETTAGE/HYSTROSCOPY WITH HYDROTHERMAL ABLATION N/A 08/21/2015   Procedure: DILATATION & CURETTAGE/HYSTEROSCOPY WITH HYDROTHERMAL ABLATION;  Surgeon: Brock Bad, MD;  Location: [facility];  Service: Gynecology;  Laterality: N/A;  . EYE SURGERY    . PILONIDAL CYST EXCISION    . TUBAL LIGATION N/A 07/01/2013   Procedure: POST PARTUM TUBAL LIGATION;  Surgeon: Allie Bossier, MD;  Location: [facility];  Service: Gynecology;  Laterality: N/A;  . TUBAL LIGATION    . TUBAL LIGATION N/A   . WISDOM TOOTH EXTRACTION      Family History  Problem Relation Age of Onset  . Alcohol abuse Neg Hx   . Arthritis Neg Hx   . Asthma Neg Hx   . Birth defects Neg Hx   . Cancer Neg Hx   . COPD Neg Hx   . Depression Neg Hx   . Diabetes Neg Hx   . Drug abuse Neg Hx   . Early death Neg Hx   . Hearing loss Neg Hx   . Heart disease Neg Hx   . Hyperlipidemia Neg Hx   . Hypertension Neg Hx   . Kidney disease Neg Hx   . Learning disabilities Neg Hx   . Mental illness Neg Hx   . Mental retardation Neg Hx   . Miscarriages / Stillbirths Neg Hx   . Stroke Neg Hx   . Vision loss  Neg Hx     Social History Social History  Substance Use Topics  . Smoking status: Current Every Day Smoker    Packs/day: 0.20    Types: Cigarettes  . Smokeless tobacco: Never Used     Comment: 2 CIGS A DAY- Stopped smoking when she had positive upt  . Alcohol use No    Allergies  Allergen Reactions  . Celebrex [Celecoxib] Hives    Current Outpatient Prescriptions  Medication Sig Dispense Refill  . albuterol (PROVENTIL HFA;VENTOLIN HFA) 108 (90 BASE) MCG/ACT inhaler Inhale 2 puffs into the lungs every 6 (six) hours as needed for wheezing. 1 Inhaler 2  . amitriptyline (ELAVIL) 25 MG tablet Take 25 mg by mouth at bedtime.    . carvedilol (COREG) 25 MG tablet Take 1 tablet (25 mg total) by mouth 2 (two) times daily with a meal. 60 tablet 11  . glipiZIDE (GLUCOTROL) 10 MG tablet Take 10 mg by mouth daily before breakfast.    . loratadine (CLARITIN) 10 MG tablet Take 1 tablet (10 mg total) by mouth daily. 30 tablet 0  . metFORMIN (GLUCOPHAGE) 500 MG tablet  Take 500 mg by mouth 2 (two) times daily.  0  . omeprazole (PRILOSEC) 20 MG capsule Take 40 mg by mouth daily.     . fluconazole (DIFLUCAN) 200 MG tablet Take 1 tablet (200 mg total) by mouth every other day. 3 tablet 2  . HYDROcodone-acetaminophen (NORCO) 10-325 MG tablet Take 1 tablet by mouth every 6 (six) hours as needed for moderate pain or severe pain. 20 tablet 0  . ibuprofen (ADVIL,MOTRIN) 800 MG tablet Take 1 tablet (800 mg total) by mouth every 8 (eight) hours as needed. 30 tablet 5   No current facility-administered medications for this visit.     Review of Systems Review of Systems Constitutional: negative for fatigue and weight loss Respiratory: negative for cough and wheezing Cardiovascular: negative for chest pain, fatigue and palpitations Gastrointestinal: negative for abdominal pain and change in bowel habits Genitourinary:positive for prolonged, heavy and painful periods Integument/breast: negative for nipple  discharge Musculoskeletal:negative for myalgias Neurological: negative for gait problems and tremors Behavioral/Psych: negative for abusive relationship, depression Endocrine: negative for temperature intolerance      Blood pressure 132/90, pulse 81, weight 175 lb (79.4 kg), last menstrual period 03/27/2017.  Physical Exam Physical Exam General:   alert  Skin:   no rash or abnormalities  Lungs:   clear to auscultation bilaterally  Heart:   regular rate and rhythm, S1, S2 normal, no murmur, click, rub or gallop  Breasts:   normal without suspicious masses, skin or nipple changes or axillary nodes  Abdomen:  normal findings: no organomegaly, soft, non-tender and no hernia  Pelvis:  External genitalia: normal general appearance Urinary system: urethral meatus normal and bladder without fullness, nontender Vaginal: normal without tenderness, induration or masses Cervix: normal appearance Adnexa: normal bimanual exam Uterus: anteverted and non-tender, normal size    50% of 15 min visit spent on counseling and coordination of care.    Data Reviewed Labs Previous ultrasound  Assessment     1. Vaginal discharge Rx: - Cervicovaginal ancillary only - fluconazole (DIFLUCAN) 200 MG tablet; Take 1 tablet (200 mg total) by mouth every other day.  Dispense: 3 tablet; Refill: 2  2. Dysmenorrhea Rx: - US Transvaginal Non-OB; Future - US Pelvis Complete; Future - HYDROcodone-acetaminophen (NORCO) 10-325 MG tablet; Take 1 tablet by mouth every 6 (six) hours as needed for moderate pain or severe pain.  Dispense: 20 tablet; Refill: 0 - ibuprofen (ADVIL,MOTRIN) 800 MG tablet; Take 1 tablet (800 mg total) by mouth every 8 (eight) hours as needed.  Dispense: 30 tablet; Refill: 5  3. Abnormal uterine bleeding (AUB) Rx: - US Transvaginal Non-OB; Future - US Pelvis Complete; Future - CBC - Comprehensive metabolic panel  4. 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  Class 1 obesity due to excess calories without serious comorbidity with body mass index (BMI) of 30.0 to 30.9 in adult      Plan    F/U in 2 weeks for results of ultrasound and labs, and surgery consult with Dr Alysia Penna for [facility].   Orders Placed This Encounter  Procedures  . US Transvaginal Non-OB    Standing Status:   Future    Standing Expiration Date:   06/11/2018    Order Specific Question:   Reason for Exam (SYMPTOM  OR DIAGNOSIS REQUIRED)    Answer:   AUB    Order Specific Question:   Preferred imaging location?    Answer:   [facility]  . US  Pelvis Complete    Standing Status:   Future    Standing Expiration Date:   06/11/2018    Order Specific Question:   Reason for Exam (SYMPTOM  OR DIAGNOSIS REQUIRED)    Answer:   AUB    Order Specific Question:   Preferred imaging location?    Answer:   [facility]  . CBC  . Comprehensive metabolic panel   Meds ordered this encounter  Medications  . HYDROcodone-acetaminophen (NORCO) 10-325 MG tablet    Sig: Take 1 tablet by mouth every 6 (six) hours as needed for moderate pain or severe pain.    Dispense:  20 tablet    Refill:  0  . ibuprofen (ADVIL,MOTRIN) 800 MG tablet    Sig: Take 1 tablet (800 mg total) by mouth every 8 (eight) hours as needed.    Dispense:  30 tablet    Refill:  5  . fluconazole (DIFLUCAN) 200 MG tablet    Sig: Take 1 tablet (200 mg total) by mouth every other day.    Dispense:  3 tablet    Refill:  2

## 2017-04-11 NOTE — Progress Notes (Signed)
Patient is in the office for yeast check. She is having abdominal cramping.

## 2017-04-12 LAB — CBC
Hematocrit: 38.4 % (ref 34.0–46.6)
Hemoglobin: 12.7 g/dL (ref 11.1–15.9)
MCH: 28.6 pg (ref 26.6–33.0)
MCHC: 33.1 g/dL (ref 31.5–35.7)
MCV: 87 fL (ref 79–97)
Platelets: 358 10*3/uL (ref 150–379)
RBC: 4.44 x10E6/uL (ref 3.77–5.28)
RDW: 14.9 % (ref 12.3–15.4)
WBC: 8.8 10*3/uL (ref 3.4–10.8)

## 2017-04-12 LAB — COMPREHENSIVE METABOLIC PANEL
ALT: 10 IU/L (ref 0–32)
AST: 15 IU/L (ref 0–40)
Albumin/Globulin Ratio: 1.5 (ref 1.2–2.2)
Albumin: 4.7 g/dL (ref 3.5–5.5)
Alkaline Phosphatase: 94 IU/L (ref 39–117)
BUN/Creatinine Ratio: 9 (ref 9–23)
BUN: 8 mg/dL (ref 6–20)
Bilirubin Total: 0.3 mg/dL (ref 0.0–1.2)
CO2: 23 mmol/L (ref 20–29)
Calcium: 10 mg/dL (ref 8.7–10.2)
Chloride: 104 mmol/L (ref 96–106)
Creatinine, Ser: 0.89 mg/dL (ref 0.57–1.00)
GFR calc Af Amer: 99 mL/min/{1.73_m2} (ref >59)
GFR calc non Af Amer: 86 mL/min/{1.73_m2} (ref >59)
Globulin, Total: 3.2 g/dL (ref 1.5–4.5)
Glucose: 98 mg/dL (ref 65–99)
Potassium: 4 mmol/L (ref 3.5–5.2)
Sodium: 144 mmol/L (ref 134–144)
Total Protein: 7.9 g/dL (ref 6.0–8.5)

## 2017-04-12 LAB — CERVICOVAGINAL ANCILLARY ONLY
Bacterial vaginitis: NEGATIVE
Candida vaginitis: POSITIVE — AB

## 2017-04-13 ENCOUNTER — Other Ambulatory Visit: Payer: Self-pay | Admitting: Obstetrics

## 2017-04-14 ENCOUNTER — Ambulatory Visit (HOSPITAL_COMMUNITY)
Admission: RE | Admit: 2017-04-14 | Discharge: 2017-04-14 | Disposition: A | Discharge status: Home / Self Care | Payer: Medicaid Other | Source: Ambulatory Visit | Attending: Obstetrics | Admitting: Obstetrics

## 2017-04-14 DIAGNOSIS — N946 Dysmenorrhea, unspecified: Secondary | ICD-10-CM | POA: Diagnosis present

## 2017-04-14 DIAGNOSIS — N854 Malposition of uterus: Secondary | ICD-10-CM | POA: Insufficient documentation

## 2017-04-14 DIAGNOSIS — N939 Abnormal uterine and vaginal bleeding, unspecified: Secondary | ICD-10-CM | POA: Diagnosis present

## 2017-04-27 ENCOUNTER — Ambulatory Visit: Payer: Medicaid Other | Admitting: Obstetrics and Gynecology

## 2017-11-09 ENCOUNTER — Other Ambulatory Visit: Payer: Self-pay

## 2017-11-09 NOTE — Telephone Encounter (Signed)
Patient wants Yeast Rx sent to [place] on [address] [place]

## 2017-11-13 ENCOUNTER — Telehealth: Payer: Self-pay | Admitting: *Deleted

## 2017-11-13 NOTE — Telephone Encounter (Signed)
Pt called in again today requesting medication for a yeast infection, please advise...  [facility] on [address] in [place], [place].Marland KitchenMarland KitchenMarland Kitchen

## 2017-11-14 ENCOUNTER — Other Ambulatory Visit: Payer: Self-pay | Admitting: Obstetrics

## 2017-11-14 DIAGNOSIS — N898 Other specified noninflammatory disorders of vagina: Secondary | ICD-10-CM

## 2017-11-14 MED ORDER — FLUCONAZOLE 200 MG PO TABS: 200.0000 mg | ORAL_TABLET | ORAL | 2 refills | Status: DC

## 2017-11-14 NOTE — Telephone Encounter (Signed)
Diflucan Rx for Yeast

## 2017-12-23 IMAGING — CR DG CHEST 2V
2 series · 2 of 2 positions shown · non-contrast
Comparison: June 30, 2015

CLINICAL DATA: Three-day history of chest pain with shortness of
breath

EXAM:
CHEST  2 VIEW

[chest pa]
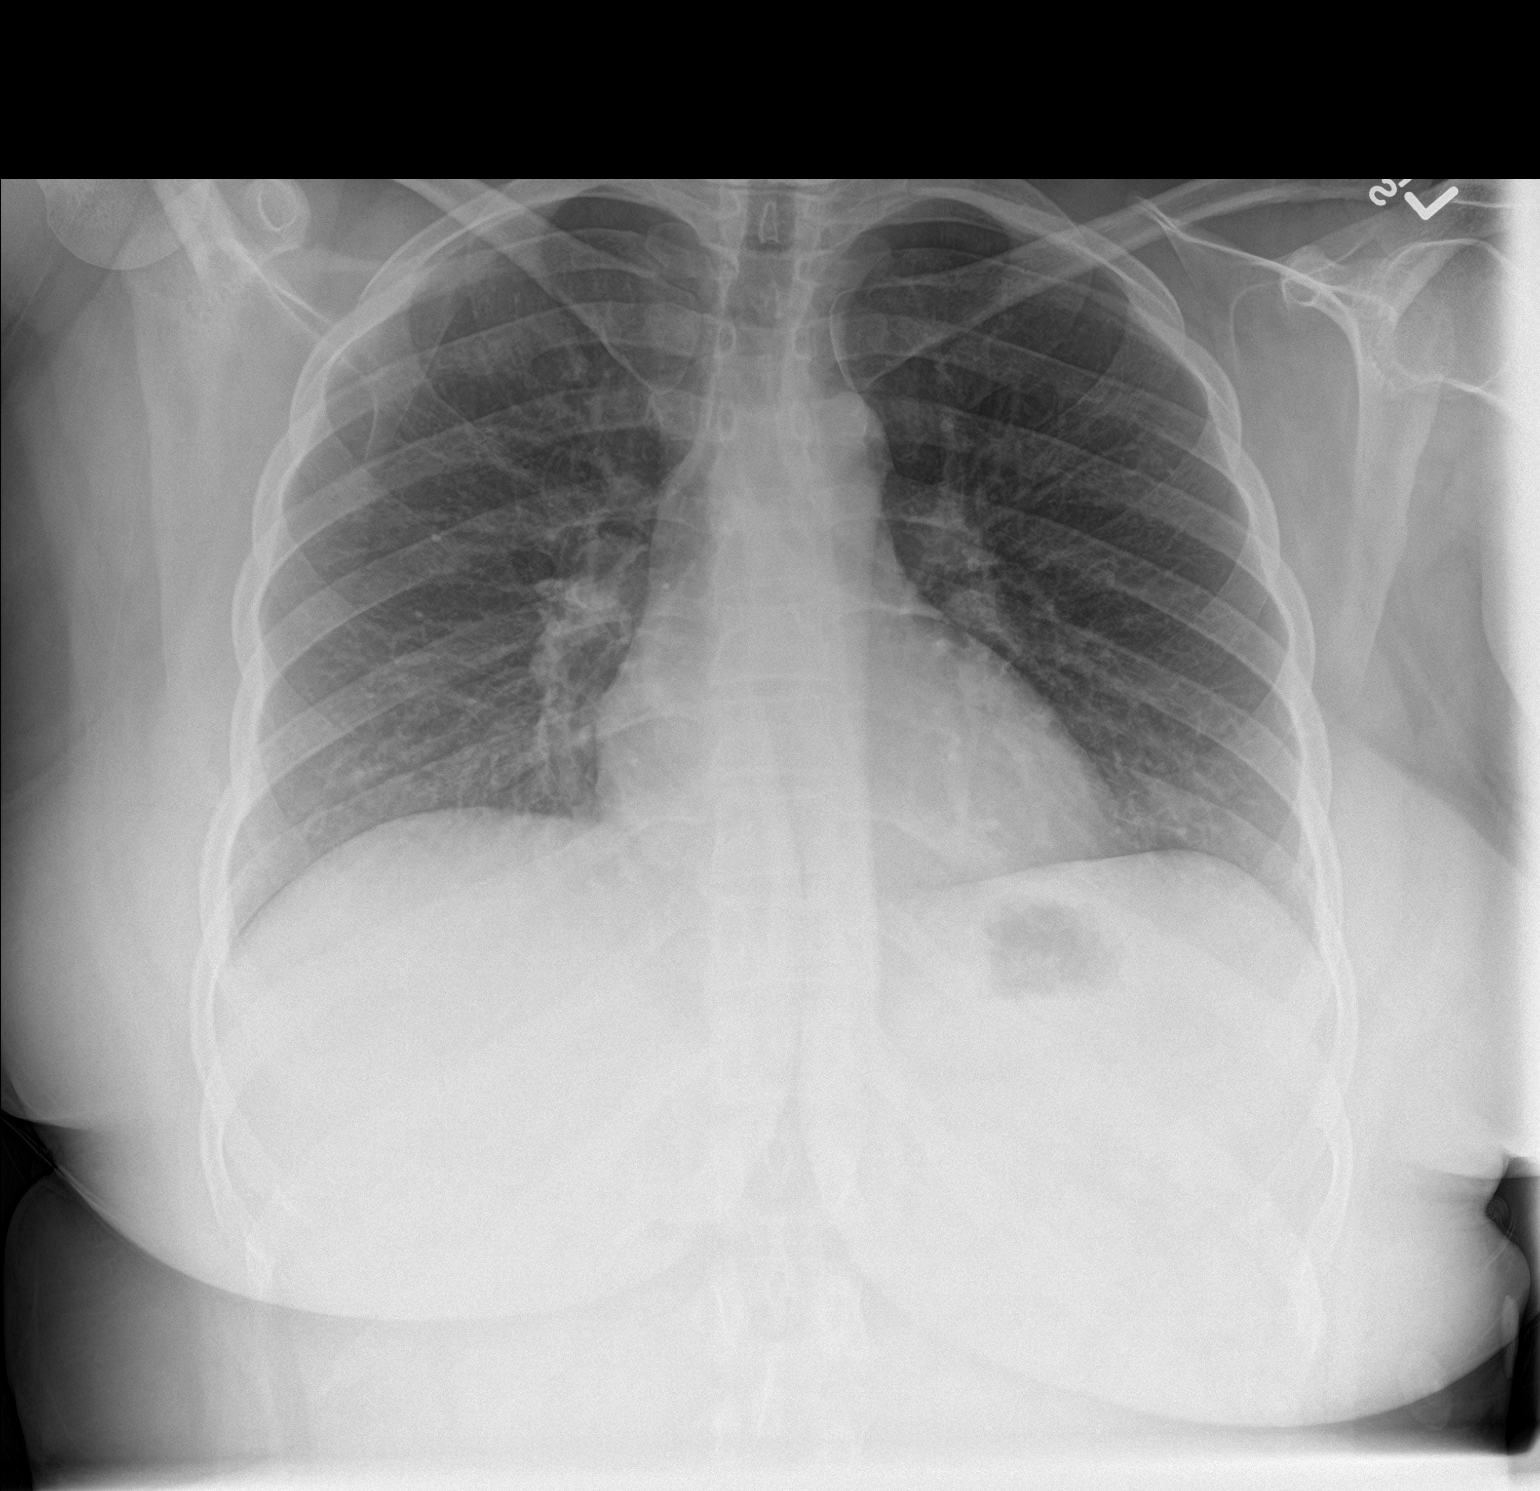

[chest lat]
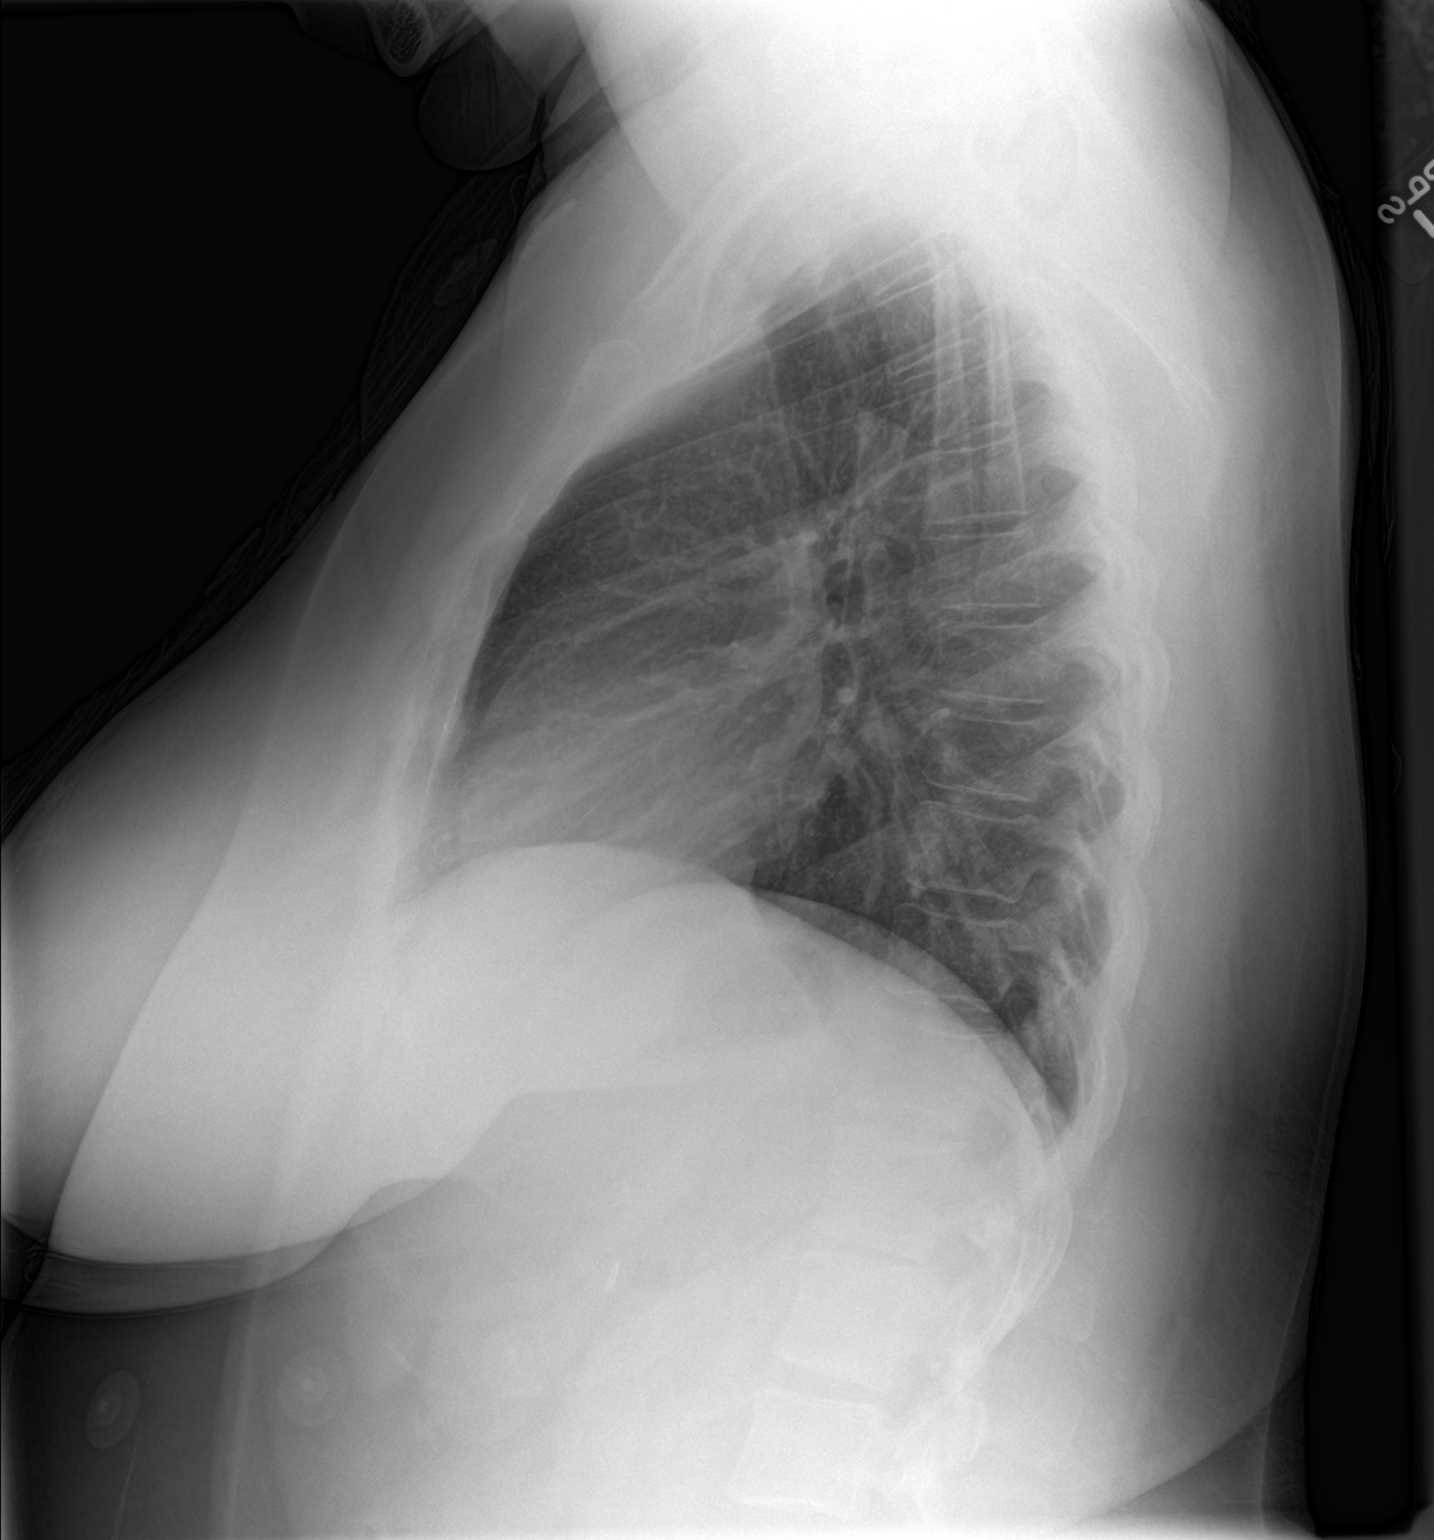

[2 of 2 positions shown; findings below may reference images not displayed]

FINDINGS: There is no edema or consolidation. The heart size and pulmonary
vascularity are normal. No adenopathy. No pneumothorax. No bone
lesions.
IMPRESSION: No edema or consolidation.

## 2018-07-03 ENCOUNTER — Ambulatory Visit (INDEPENDENT_AMBULATORY_CARE_PROVIDER_SITE_OTHER): Payer: Medicare HMO | Admitting: Obstetrics

## 2018-07-03 ENCOUNTER — Other Ambulatory Visit (HOSPITAL_COMMUNITY)
Admission: RE | Admit: 2018-07-03 | Discharge: 2018-07-03 | Disposition: A | Discharge status: Home / Self Care | Payer: Medicare HMO | Source: Ambulatory Visit | Attending: Obstetrics | Admitting: Obstetrics

## 2018-07-03 ENCOUNTER — Encounter: Payer: Self-pay | Admitting: Obstetrics

## 2018-07-03 VITALS — BP 164/101 | HR 76 | Temp 98.0°F | Ht 65.0 in | Wt 177.7 lb

## 2018-07-03 DIAGNOSIS — F17210 Nicotine dependence, cigarettes, uncomplicated: Secondary | ICD-10-CM

## 2018-07-03 DIAGNOSIS — N898 Other specified noninflammatory disorders of vagina: Secondary | ICD-10-CM | POA: Insufficient documentation

## 2018-07-03 DIAGNOSIS — Z01419 Encounter for gynecological examination (general) (routine) without abnormal findings: Secondary | ICD-10-CM | POA: Diagnosis not present

## 2018-07-03 DIAGNOSIS — R350 Frequency of micturition: Secondary | ICD-10-CM

## 2018-07-03 DIAGNOSIS — N946 Dysmenorrhea, unspecified: Secondary | ICD-10-CM

## 2018-07-03 MED ORDER — NAPROXEN 500 MG PO TBEC: 500.0000 mg | DELAYED_RELEASE_TABLET | Freq: Two times a day (BID) | ORAL | 4 refills | Status: DC

## 2018-07-03 NOTE — Progress Notes (Signed)
Subjective:        Stacy [name] is a 32 y.o. female here for a routine exam.  Current complaints: Urinary frequency.  Irregular periods.  Increased cramping with periods.  Undesired weight gain.  Personal health questionnaire:  Is patient Ashkenazi Jewish, have a family history of breast and/or ovarian cancer: no Is there a family history of uterine cancer diagnosed at age < 54, gastrointestinal cancer, urinary tract cancer, family member who is a Personnel officer syndrome-associated carrier: no Is the patient overweight and hypertensive, family history of diabetes, personal history of gestational diabetes, preeclampsia or PCOS: no Is patient over 32, have PCOS,  family history of premature CHD under age 49, diabetes, smoke, have hypertension or peripheral artery disease:  no At any time, has a partner hit, kicked or otherwise hurt or frightened you?: no Over the past 2 weeks, have you felt down, depressed or hopeless?: no Over the past 2 weeks, have you felt little interest or pleasure in doing things?:no   Gynecologic History Patient's last menstrual period was 06/13/2018. Contraception: tubal ligation Last Pap: 2018. Results were: normal Last mammogram: n/a. Results were: n/a  Obstetric History OB History  Gravida Para Term Preterm AB Living  4 3 3   1 3   SAB TAB Ectopic Multiple Live Births    1     3    # Outcome Date GA Lbr Len/2nd Weight Sex Delivery Anes PTL Lv  4 Term 07/01/13 [redacted]w[redacted]d 20:50 / 00:12 6 lb 3.5 oz (2.82 kg) M Vag-Spont EPI  LIV     Birth Comments: extra digit on left hand  3 Term 10/29/09 [redacted]w[redacted]d  7 lb 3 oz (3.26 kg) F Vag-Spont EPI  LIV  2 Term 01/02/06 [redacted]w[redacted]d  7 lb 2 oz (3.232 kg) F Vag-Spont EPI N LIV  1 TAB             Past Medical History:  Diagnosis Date  . Anxiety   . Arthritis    rheumatoid  . Asthma   . Depression   . Diabetes mellitus without complication (HCC)   . Hypertension   . Medical history non-contributory   . UTI (urinary tract  infection)   . Uveitis     Past Surgical History:  Procedure Laterality Date  . CHOLECYSTECTOMY    . CYSTECTOMY    . DILITATION & CURRETTAGE/HYSTROSCOPY WITH HYDROTHERMAL ABLATION N/A 08/21/2015   Procedure: DILATATION & CURETTAGE/HYSTEROSCOPY WITH HYDROTHERMAL ABLATION;  Surgeon: Brock Bad, MD;  Location: [facility];  Service: Gynecology;  Laterality: N/A;  . EYE SURGERY    . PILONIDAL CYST EXCISION    . TUBAL LIGATION N/A 07/01/2013   Procedure: POST PARTUM TUBAL LIGATION;  Surgeon: Allie Bossier, MD;  Location: [facility];  Service: Gynecology;  Laterality: N/A;  . TUBAL LIGATION    . TUBAL LIGATION N/A   . WISDOM TOOTH EXTRACTION       Current Outpatient Medications:  .  albuterol (PROVENTIL HFA;VENTOLIN HFA) 108 (90 BASE) MCG/ACT inhaler, Inhale 2 puffs into the lungs every 6 (six) hours as needed for wheezing. (Patient not taking: Reported on 07/03/2018), Disp: 1 Inhaler, Rfl: 2 .  amitriptyline (ELAVIL) 25 MG tablet, Take 25 mg by mouth at bedtime., Disp: , Rfl:  .  carvedilol (COREG) 25 MG tablet, Take 1 tablet (25 mg total) by mouth 2 (two) times daily with a meal. (Patient not taking: Reported on 07/03/2018), Disp: 60 tablet, Rfl: 11 .  fluconazole (DIFLUCAN) 200 MG tablet,  Take 1 tablet (200 mg total) by mouth every other day. (Patient not taking: Reported on 07/03/2018), Disp: 3 tablet, Rfl: 2 .  glipiZIDE (GLUCOTROL) 10 MG tablet, Take 10 mg by mouth daily before breakfast., Disp: , Rfl:  .  HYDROcodone-acetaminophen (NORCO) 10-325 MG tablet, Take 1 tablet by mouth every 6 (six) hours as needed for moderate pain or severe pain. (Patient not taking: Reported on 07/03/2018), Disp: 20 tablet, Rfl: 0 .  ibuprofen (ADVIL,MOTRIN) 800 MG tablet, Take 1 tablet (800 mg total) by mouth every 8 (eight) hours as needed. (Patient not taking: Reported on 07/03/2018), Disp: 30 tablet, Rfl: 5 .  loratadine (CLARITIN) 10 MG tablet, Take 1 tablet (10 mg total) by mouth daily. (Patient not taking:  Reported on 07/03/2018), Disp: 30 tablet, Rfl: 0 .  metFORMIN (GLUCOPHAGE) 500 MG tablet, Take 500 mg by mouth 2 (two) times daily., Disp: , Rfl: 0 .  naproxen (EC-NAPROSYN) 500 MG EC tablet, Take 1 tablet (500 mg total) by mouth 2 (two) times daily with a meal., Disp: 30 tablet, Rfl: 4 .  omeprazole (PRILOSEC) 20 MG capsule, Take 40 mg by mouth daily. , Disp: , Rfl:  Allergies  Allergen Reactions  . Celebrex [Celecoxib] Hives    Social History   Tobacco Use  . Smoking status: Current Every Day Smoker    Packs/day: 0.20    Types: Cigarettes  . Smokeless tobacco: Never Used  . Tobacco comment: 2 CIGS A DAY- Stopped smoking when she had positive upt  Substance Use Topics  . Alcohol use: No    Alcohol/week: 0.0 standard drinks    Family History  Problem Relation Age of Onset  . Alcohol abuse Neg Hx   . Arthritis Neg Hx   . Asthma Neg Hx   . Birth defects Neg Hx   . Cancer Neg Hx   . COPD Neg Hx   . Depression Neg Hx   . Diabetes Neg Hx   . Drug abuse Neg Hx   . Early death Neg Hx   . Hearing loss Neg Hx   . Heart disease Neg Hx   . Hyperlipidemia Neg Hx   . Hypertension Neg Hx   . Kidney disease Neg Hx   . Learning disabilities Neg Hx   . Mental illness Neg Hx   . Mental retardation Neg Hx   . Miscarriages / Stillbirths Neg Hx   . Stroke Neg Hx   . Vision loss Neg Hx       Review of Systems  Constitutional: positive for undesired weight gain Respiratory: negative for cough and wheezing Cardiovascular: negative for chest pain, fatigue and palpitations Gastrointestinal: negative for abdominal pain and change in bowel habits Musculoskeletal:negative for myalgias Neurological: negative for gait problems and tremors Behavioral/Psych: negative for abusive relationship, depression Endocrine: negative for temperature intolerance    Genitourinary:positive for abnormal menstrual periods and vaginal discharge.  Positive urinary frequency Integument/breast: negative for  breast lump, breast tenderness, nipple discharge and skin lesion(s)    Objective:       BP (!) 164/101   Pulse 76   Temp 98 F (36.7 C)   Ht 5\' 5"  (1.651 m)   Wt 177 lb 11.2 oz (80.6 kg)   LMP 06/13/2018   BMI 29.57 kg/m  General:   alert  Skin:   no rash or abnormalities  Lungs:   clear to auscultation bilaterally  Heart:   regular rate and rhythm, S1, S2 normal, no murmur, click, rub or gallop  Breasts:   normal without suspicious masses, skin or nipple changes or axillary nodes  Abdomen:  normal findings: no organomegaly, soft, non-tender and no hernia  Pelvis:  External genitalia: normal general appearance Urinary system: urethral meatus normal and bladder without fullness, nontender Vaginal: normal without tenderness, induration or masses Cervix: normal appearance Adnexa: normal bimanual exam Uterus: anteverted and non-tender, normal size   Lab Review Urine pregnancy test Labs reviewed yes Radiologic studies reviewed no  50% of 20 min visit spent on counseling and coordination of care.   Assessment:     1. Encounter for routine gynecological examination with Papanicolaou smear of cervix Rx: - Cytology - PAP( [facility])  2. Dysmenorrhea Rx: - naproxen (EC-NAPROSYN) 500 MG EC tablet; Take 1 tablet (500 mg total) by mouth 2 (two) times daily with a meal.  Dispense: 30 tablet; Refill: 4  3. Vaginal discharge Rx: - Cervicovaginal ancillary only( [place])  4. Urinary frequency Rx: - Urine Culture  5. Tobacco dependence due to cigarettes - program of medication and behavioral modification recommended for tobacco cessation    Plan:    Education reviewed: calcium supplements, depression evaluation, low fat, low cholesterol diet, safe sex/STD prevention, self breast exams, smoking cessation and weight bearing exercise. Follow up in: 1 year.   Meds ordered this encounter  Medications  . naproxen (EC-NAPROSYN) 500 MG EC tablet    Sig: Take 1 tablet (500  mg total) by mouth 2 (two) times daily with a meal.    Dispense:  30 tablet    Refill:  4   Orders Placed This Encounter  Procedures  . Urine Culture    Brock Bad MD 07-03-2018

## 2018-07-03 NOTE — Progress Notes (Signed)
Presents for AEX/PAP/STD check.  C/o increased appetite, weight gain, frequent urination, weird periods.

## 2018-07-04 LAB — CERVICOVAGINAL ANCILLARY ONLY
Bacterial vaginitis: POSITIVE — AB
Candida vaginitis: NEGATIVE
Chlamydia: NEGATIVE
Neisseria Gonorrhea: NEGATIVE
Trichomonas: NEGATIVE

## 2018-07-05 ENCOUNTER — Other Ambulatory Visit: Payer: Self-pay | Admitting: Obstetrics

## 2018-07-05 DIAGNOSIS — N760 Acute vaginitis: Principal | ICD-10-CM

## 2018-07-05 DIAGNOSIS — B9689 Other specified bacterial agents as the cause of diseases classified elsewhere: Secondary | ICD-10-CM

## 2018-07-05 LAB — URINE CULTURE

## 2018-07-05 MED ORDER — TINIDAZOLE 500 MG PO TABS: 1000.0000 mg | ORAL_TABLET | Freq: Every day | ORAL | 2 refills | Status: DC

## 2018-07-06 LAB — CYTOLOGY - PAP
Diagnosis: NEGATIVE
HPV: NOT DETECTED

## 2018-09-10 ENCOUNTER — Telehealth: Payer: Self-pay | Admitting: Obstetrics

## 2018-09-10 ENCOUNTER — Other Ambulatory Visit: Payer: Self-pay | Admitting: Obstetrics

## 2018-09-10 DIAGNOSIS — N898 Other specified noninflammatory disorders of vagina: Secondary | ICD-10-CM

## 2018-09-10 MED ORDER — FLUCONAZOLE 200 MG PO TABS: 200.0000 mg | ORAL_TABLET | ORAL | 2 refills | Status: DC

## 2018-09-10 NOTE — Telephone Encounter (Signed)
Diflucan Rx 

## 2018-09-10 NOTE — Telephone Encounter (Signed)
Patient called requesting a prescription for yeast.  She describes itching and peeling under her breasts.  She states she has had yeast under her breasts before.    Routing to Dr. Clearance Coots for Rx approval.    Pharmacy in system is correct.

## 2018-12-08 ENCOUNTER — Other Ambulatory Visit: Payer: Self-pay | Admitting: Obstetrics

## 2018-12-08 DIAGNOSIS — N760 Acute vaginitis: Secondary | ICD-10-CM

## 2018-12-08 DIAGNOSIS — N898 Other specified noninflammatory disorders of vagina: Secondary | ICD-10-CM

## 2018-12-08 DIAGNOSIS — B9689 Other specified bacterial agents as the cause of diseases classified elsewhere: Secondary | ICD-10-CM

## 2019-01-04 ENCOUNTER — Other Ambulatory Visit: Payer: Self-pay | Admitting: Obstetrics

## 2019-01-04 DIAGNOSIS — B9689 Other specified bacterial agents as the cause of diseases classified elsewhere: Secondary | ICD-10-CM

## 2019-01-04 DIAGNOSIS — N760 Acute vaginitis: Secondary | ICD-10-CM

## 2019-01-04 NOTE — Telephone Encounter (Signed)
Please review for refill.  

## 2019-05-07 IMAGING — US US TRANSVAGINAL NON-OB
1 series · 15 of 25 positions shown · non-contrast
Comparison: 07/10/2015

CLINICAL DATA: Dysmenorrhea. Abnormal uterine bleeding. Status post
tubal ligation, D and C[place] with ablation. Gravida 4 para 3 AB1.



[Series 1: us transvaginal non-ob · 15 of 58 slices shown]
[im 1/58]
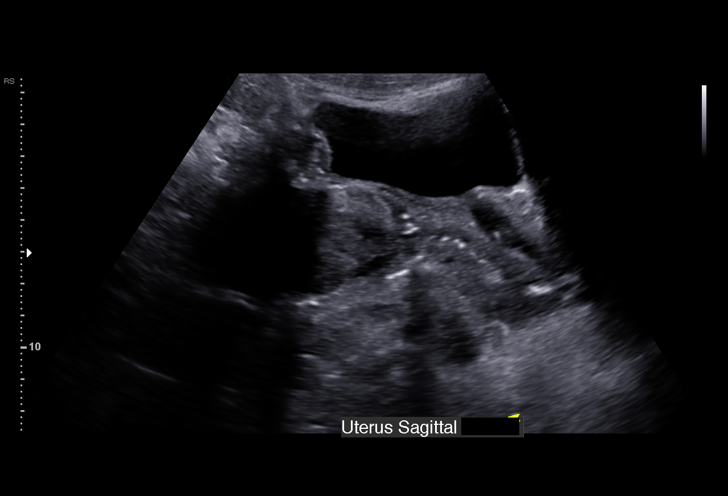
[im 5/58]
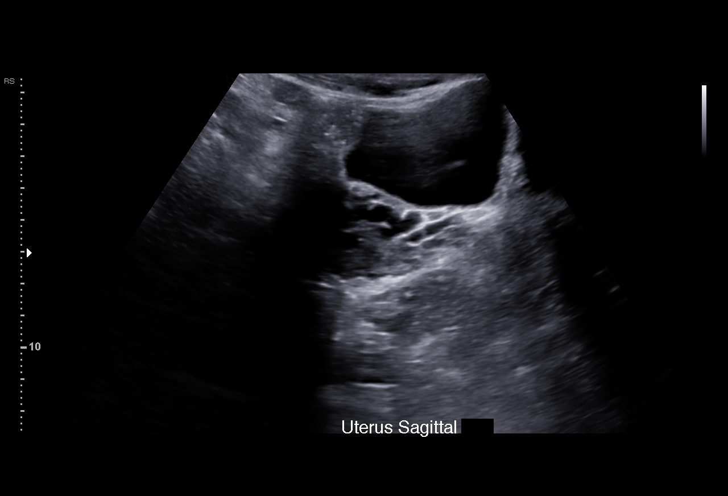
[im 10/58]
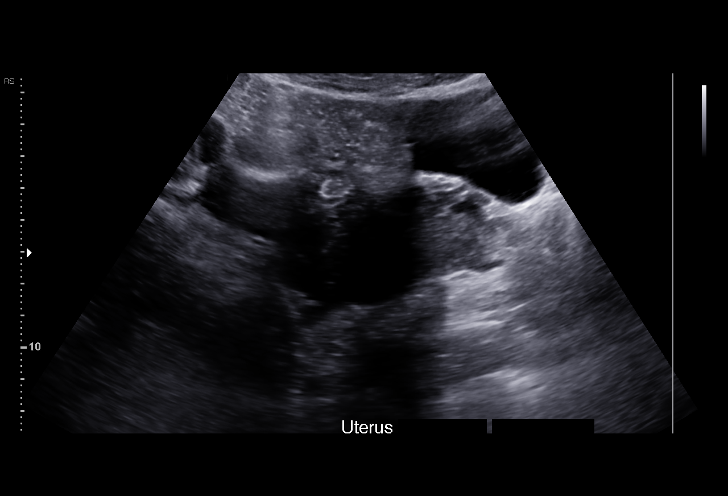
[im 12/58]
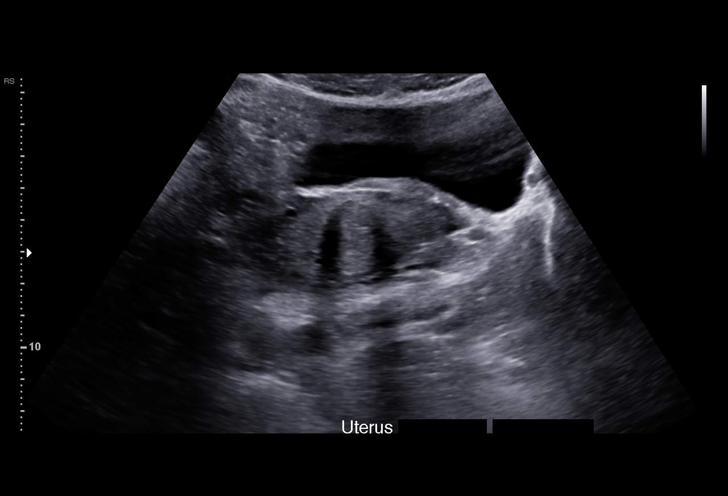
[im 17/58]
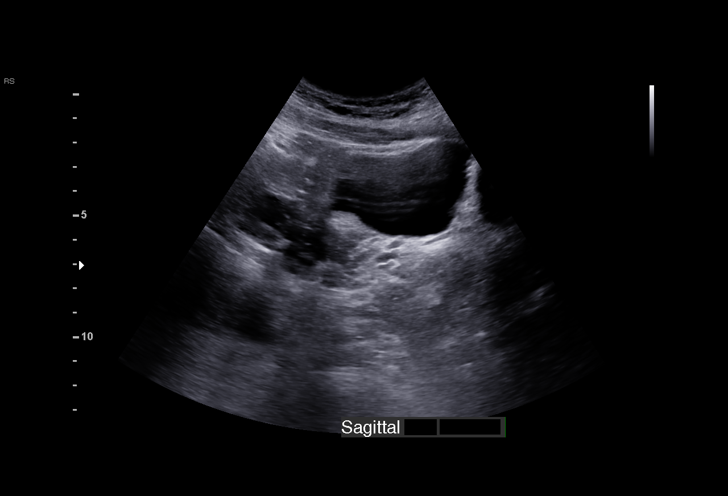
[im 22/58]
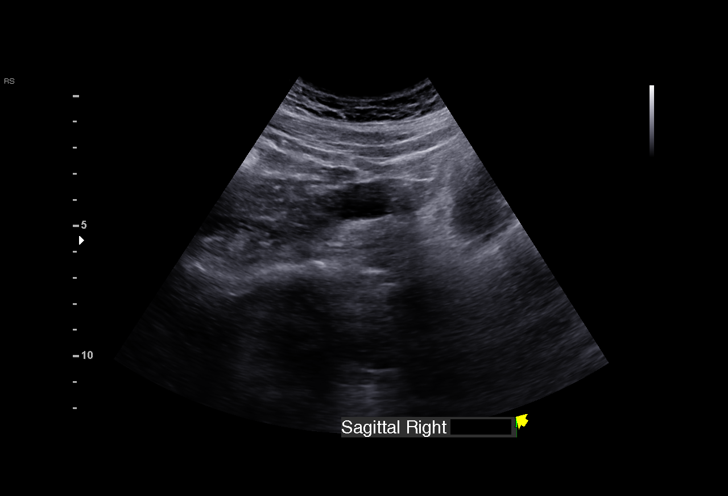
[im 24/58]
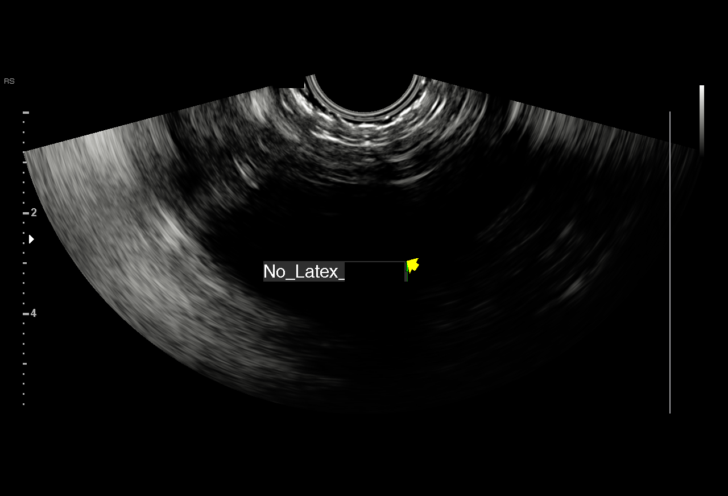
[im 29/58]
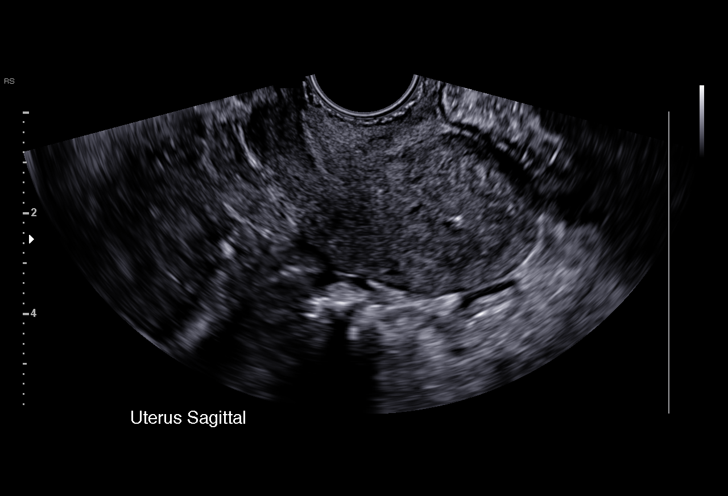
[im 34/58]
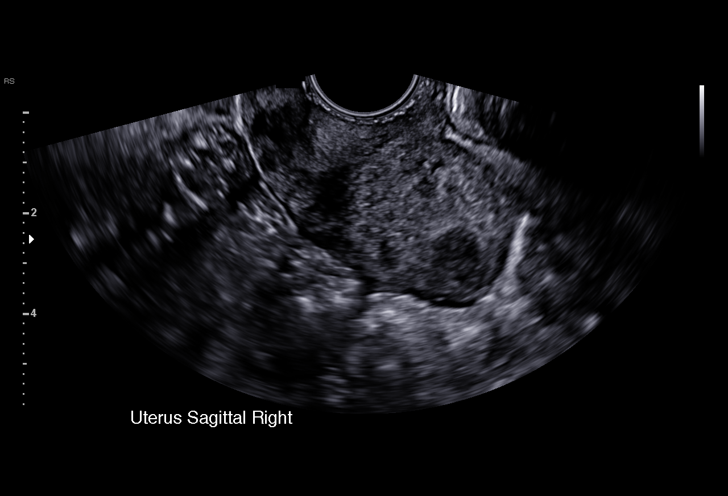
[im 36/58]
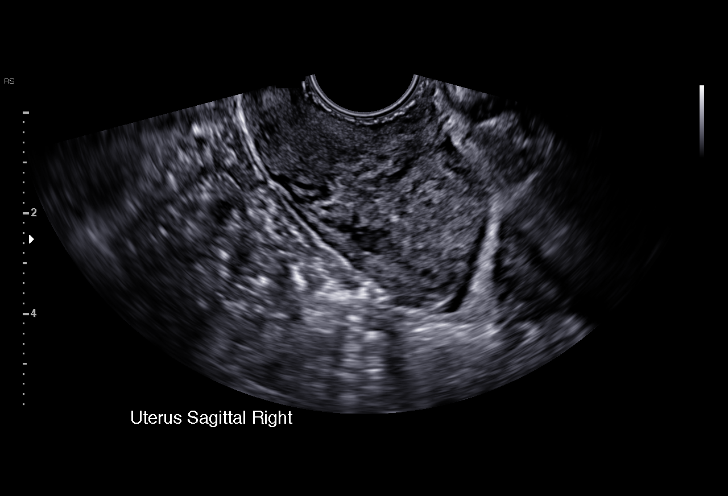
[im 41/58]
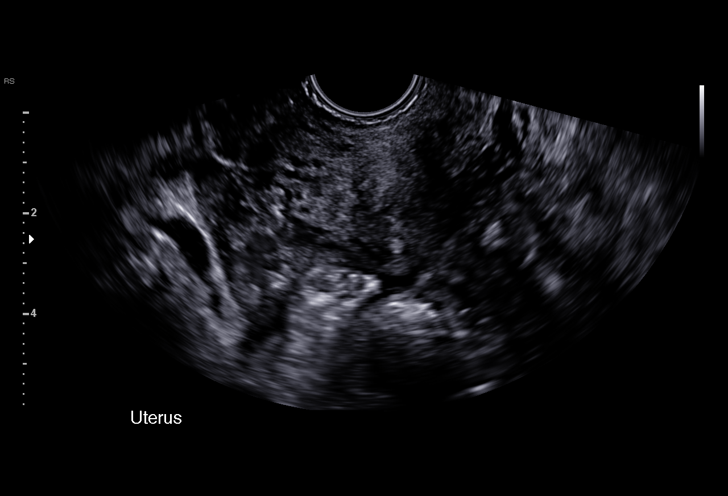
[im 46/58]
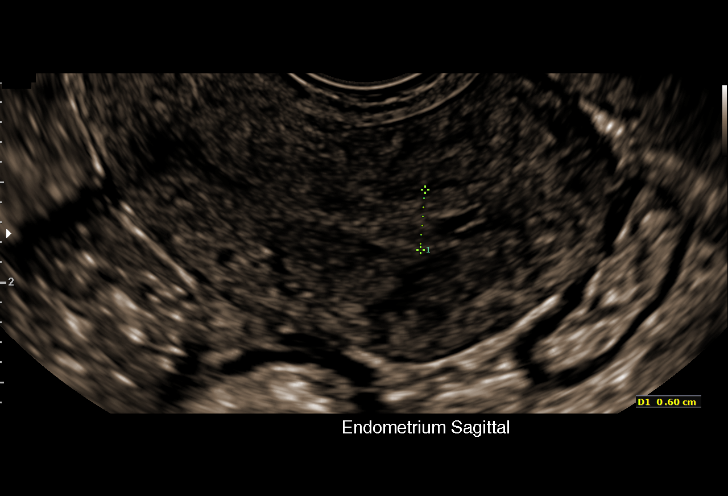
[im 48/58]
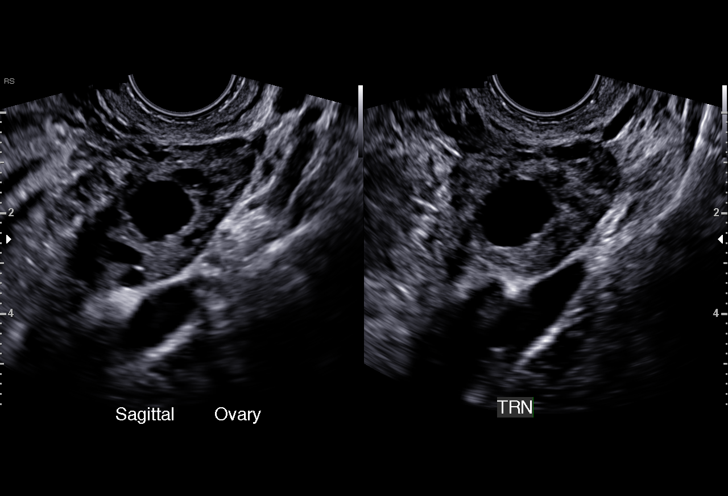
[im 53/58]
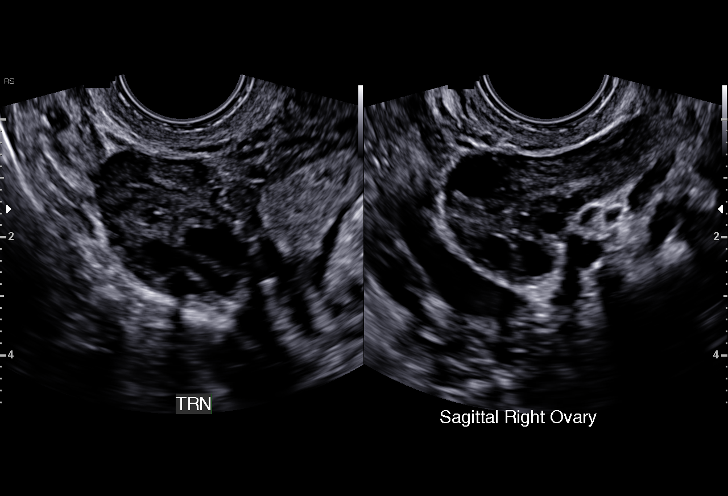
[im 58/58]
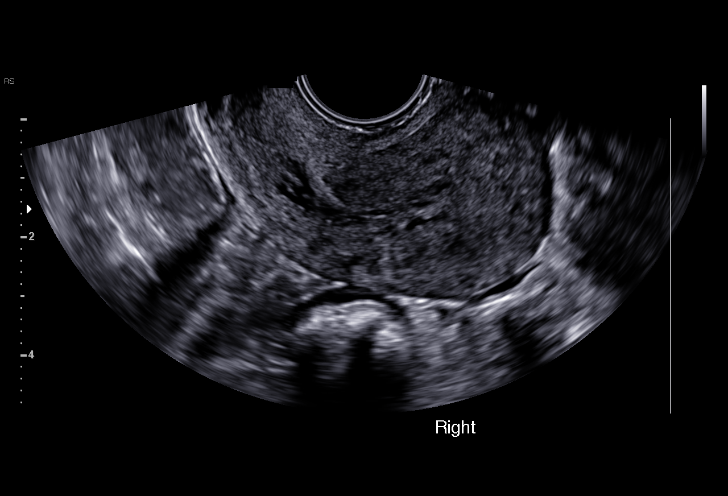

[15 of 25 positions shown; findings below may reference images not displayed]

FINDINGS: Uterus

Measurements: 7.0 x 3.5 x 4.9 cm. Uterus is retroverted and
heterogeneous in appearance. No focal mass.

Endometrium

Thickness: 6 mm.  Mildly heterogeneous without discrete mass.

Right ovary

Measurements: 3.9 x 2.6 x 2.4 cm. Normal appearance/no adnexal mass.

Left ovary

Measurements: 4.1 x 2.3 x 2.8 cm. Normal appearance/no adnexal mass.

Other findings

Trace free pelvic fluid.
IMPRESSION: 1. Retroverted normal appearing uterus.
2. Normal endometrial thickness. If bleeding remains unresponsive to
hormonal or medical therapy, sonohysterogram should be considered
for focal lesion work-up. (Ref: Radiological Reasoning: Algorithmic
Workup of Abnormal Vaginal Bleeding with Endovaginal Sonography and
Sonohysterography. [id number]; 191:S68-73)
3. No evidence for adnexal/ovarian mass.

## 2019-07-02 ENCOUNTER — Telehealth: Payer: Self-pay | Admitting: Obstetrics

## 2019-07-02 MED ORDER — FLUCONAZOLE 150 MG PO TABS: 150.0000 mg | ORAL_TABLET | Freq: Once | ORAL | 0 refills | Status: AC

## 2019-07-02 NOTE — Telephone Encounter (Signed)
Patient called requesting Rx for yeast.  Rx routed to pharmacy per protocol .    

## 2019-08-07 ENCOUNTER — Other Ambulatory Visit: Payer: Self-pay | Admitting: Obstetrics

## 2019-08-07 DIAGNOSIS — N898 Other specified noninflammatory disorders of vagina: Secondary | ICD-10-CM

## 2019-08-07 NOTE — Telephone Encounter (Signed)
Refill request from pharmacy    Name from pharmacy: Fluconazole 200 MG Oral Tablet       Will file in chart as: fluconazole (DIFLUCAN) 200 MG tablet   Sig: TAKE 1 TABLET BY MOUTH EVERY OTHER DAY   Disp:  3 tablet    Refills:  0   Start: 08/07/2019   Class: Normal   Non-formulary For: Vaginal discharge   Last ordered: 7 months ago by Brock Bad, MD Last refill: 07/03/2019   Rx #: [id number]     To be filled at: [facility] - [place], [place] - [address]

## 2019-11-20 ENCOUNTER — Encounter: Payer: Self-pay | Admitting: Obstetrics

## 2019-11-20 ENCOUNTER — Other Ambulatory Visit: Payer: Self-pay

## 2019-11-20 ENCOUNTER — Ambulatory Visit (INDEPENDENT_AMBULATORY_CARE_PROVIDER_SITE_OTHER): Payer: Medicare Other | Admitting: Obstetrics

## 2019-11-20 ENCOUNTER — Other Ambulatory Visit (HOSPITAL_COMMUNITY)
Admission: RE | Admit: 2019-11-20 | Discharge: 2019-11-20 | Disposition: A | Discharge status: Home / Self Care | Payer: Medicare Other | Source: Ambulatory Visit | Attending: Obstetrics | Admitting: Obstetrics

## 2019-11-20 VITALS — BP 167/113 | HR 73 | Wt 180.0 lb

## 2019-11-20 DIAGNOSIS — I10 Essential (primary) hypertension: Secondary | ICD-10-CM

## 2019-11-20 DIAGNOSIS — Z1151 Encounter for screening for human papillomavirus (HPV): Secondary | ICD-10-CM | POA: Insufficient documentation

## 2019-11-20 DIAGNOSIS — Z113 Encounter for screening for infections with a predominantly sexual mode of transmission: Secondary | ICD-10-CM

## 2019-11-20 DIAGNOSIS — N946 Dysmenorrhea, unspecified: Secondary | ICD-10-CM

## 2019-11-20 DIAGNOSIS — F17210 Nicotine dependence, cigarettes, uncomplicated: Secondary | ICD-10-CM

## 2019-11-20 DIAGNOSIS — N898 Other specified noninflammatory disorders of vagina: Secondary | ICD-10-CM | POA: Diagnosis present

## 2019-11-20 DIAGNOSIS — Z01419 Encounter for gynecological examination (general) (routine) without abnormal findings: Secondary | ICD-10-CM

## 2019-11-20 DIAGNOSIS — B9689 Other specified bacterial agents as the cause of diseases classified elsewhere: Secondary | ICD-10-CM

## 2019-11-20 DIAGNOSIS — J4520 Mild intermittent asthma, uncomplicated: Secondary | ICD-10-CM

## 2019-11-20 DIAGNOSIS — B373 Candidiasis of vulva and vagina: Secondary | ICD-10-CM

## 2019-11-20 DIAGNOSIS — N760 Acute vaginitis: Secondary | ICD-10-CM

## 2019-11-20 DIAGNOSIS — B3731 Acute candidiasis of vulva and vagina: Secondary | ICD-10-CM

## 2019-11-20 DIAGNOSIS — J301 Allergic rhinitis due to pollen: Secondary | ICD-10-CM

## 2019-11-20 DIAGNOSIS — K219 Gastro-esophageal reflux disease without esophagitis: Secondary | ICD-10-CM

## 2019-11-20 MED ORDER — TINIDAZOLE 500 MG PO TABS: 1000.0000 mg | ORAL_TABLET | Freq: Every day | ORAL | 2 refills | Status: DC

## 2019-11-20 MED ORDER — OMEPRAZOLE 20 MG PO CPDR: 20.0000 mg | DELAYED_RELEASE_CAPSULE | Freq: Two times a day (BID) | ORAL | 11 refills | Status: AC

## 2019-11-20 MED ORDER — ALBUTEROL SULFATE HFA 108 (90 BASE) MCG/ACT IN AERS: 2.0000 | INHALATION_SPRAY | Freq: Four times a day (QID) | RESPIRATORY_TRACT | 11 refills | Status: DC | PRN

## 2019-11-20 MED ORDER — FLUCONAZOLE 200 MG PO TABS: 200.0000 mg | ORAL_TABLET | ORAL | 2 refills | Status: DC

## 2019-11-20 MED ORDER — NAPROXEN 500 MG PO TBEC: 500.0000 mg | DELAYED_RELEASE_TABLET | Freq: Two times a day (BID) | ORAL | 5 refills | Status: AC

## 2019-11-20 MED ORDER — LORATADINE 10 MG PO TABS: 10.0000 mg | ORAL_TABLET | Freq: Every day | ORAL | 11 refills | Status: AC

## 2019-11-20 MED ORDER — CARVEDILOL 25 MG PO TABS: 25.0000 mg | ORAL_TABLET | Freq: Two times a day (BID) | ORAL | 11 refills | Status: DC

## 2019-11-20 MED ORDER — TRIAMTERENE-HCTZ 37.5-25 MG PO CAPS: 1.0000 | ORAL_CAPSULE | Freq: Every day | ORAL | 11 refills | Status: DC

## 2019-11-20 NOTE — Progress Notes (Signed)
Pt needs refill on Naproxen and Omeprazole. Pt is having irregular cycles- pt has had tubal.

## 2019-11-20 NOTE — Progress Notes (Signed)
Subjective:        Stacy [name] is a 34 y.o. female here for a routine exam.  Current complaints: Irregular vaginal spotting.  Denies pelvic pain, dysuria.  Personal health questionnaire:  Is patient Ashkenazi Jewish, have a family history of breast and/or ovarian cancer: no Is there a family history of uterine cancer diagnosed at age < 17, gastrointestinal cancer, urinary tract cancer, family member who is a Personnel officer syndrome-associated carrier: no Is the patient overweight and hypertensive, family history of diabetes, personal history of gestational diabetes, preeclampsia or PCOS: no Is patient over 62, have PCOS,  family history of premature CHD under age 80, diabetes, smoke, have hypertension or peripheral artery disease:  no At any time, has a partner hit, kicked or otherwise hurt or frightened you?: no Over the past 2 weeks, have you felt down, depressed or hopeless?: no Over the past 2 weeks, have you felt little interest or pleasure in doing things?:no   Gynecologic History No LMP recorded. Patient has had an ablation. Contraception: tubal ligation Last Pap: 2019. Results were: normal Last mammogram: n/a. Results were: n/a  Obstetric History OB History  Gravida Para Term Preterm AB Living  4 3 3   1 3   SAB TAB Ectopic Multiple Live Births    1     3    # Outcome Date GA Lbr Len/2nd Weight Sex Delivery Anes PTL Lv  4 Term 07/01/13 [redacted]w[redacted]d 20:50 / 00:12 6 lb 3.5 oz (2.82 kg) M Vag-Spont EPI  LIV     Birth Comments: extra digit on left hand  3 Term 10/29/09 [redacted]w[redacted]d  7 lb 3 oz (3.26 kg) F Vag-Spont EPI  LIV  2 Term 01/02/06 [redacted]w[redacted]d  7 lb 2 oz (3.232 kg) F Vag-Spont EPI N LIV  1 TAB             Past Medical History:  Diagnosis Date  . Anxiety   . Arthritis    rheumatoid  . Asthma   . Depression   . Diabetes mellitus without complication (HCC)   . Hypertension   . Medical history non-contributory   . UTI (urinary tract infection)   . Uveitis     Past Surgical  History:  Procedure Laterality Date  . CHOLECYSTECTOMY    . CYSTECTOMY    . DILITATION & CURRETTAGE/HYSTROSCOPY WITH HYDROTHERMAL ABLATION N/A 08/21/2015   Procedure: DILATATION & CURETTAGE/HYSTEROSCOPY WITH HYDROTHERMAL ABLATION;  Surgeon: 10/19/2015, MD;  Location: [facility];  Service: Gynecology;  Laterality: N/A;  . EYE SURGERY    . PILONIDAL CYST EXCISION    . TUBAL LIGATION N/A 07/01/2013   Procedure: POST PARTUM TUBAL LIGATION;  Surgeon: 07/03/2013, MD;  Location: [facility];  Service: Gynecology;  Laterality: N/A;  . TUBAL LIGATION    . TUBAL LIGATION N/A   . WISDOM TOOTH EXTRACTION       Current Outpatient Medications:  .  albuterol (VENTOLIN HFA) 108 (90 Base) MCG/ACT inhaler, Inhale 2 puffs into the lungs every 6 (six) hours as needed for wheezing., Disp: 6.7 g, Rfl: 11 .  ibuprofen (ADVIL,MOTRIN) 800 MG tablet, Take 1 tablet (800 mg total) by mouth every 8 (eight) hours as needed., Disp: 30 tablet, Rfl: 5 .  omeprazole (PRILOSEC) 20 MG capsule, Take 1 capsule (20 mg total) by mouth 2 (two) times daily before a meal., Disp: 60 capsule, Rfl: 11 .  amitriptyline (ELAVIL) 25 MG tablet, Take 25 mg by mouth at bedtime., Disp: ,  Rfl:  .  carvedilol (COREG) 25 MG tablet, Take 1 tablet (25 mg total) by mouth 2 (two) times daily with a meal., Disp: 60 tablet, Rfl: 11 .  fluconazole (DIFLUCAN) 200 MG tablet, Take 1 tablet (200 mg total) by mouth every other day., Disp: 3 tablet, Rfl: 2 .  loratadine (CLARITIN) 10 MG tablet, Take 1 tablet (10 mg total) by mouth daily., Disp: 30 tablet, Rfl: 11 .  naproxen (EC-NAPROSYN) 500 MG EC tablet, Take 1 tablet (500 mg total) by mouth 2 (two) times daily with a meal., Disp: 30 tablet, Rfl: 5 .  tinidazole (TINDAMAX) 500 MG tablet, Take 2 tablets (1,000 mg total) by mouth daily with breakfast., Disp: 10 tablet, Rfl: 2 .  triamterene-hydrochlorothiazide (DYAZIDE) 37.5-25 MG capsule, Take 1 each (1 capsule total) by mouth daily before breakfast., Disp: 30  capsule, Rfl: 11 Allergies  Allergen Reactions  . Celebrex [Celecoxib] Hives    Social History   Tobacco Use  . Smoking status: Current Every Day Smoker    Packs/day: 0.20    Types: Cigarettes  . Smokeless tobacco: Never Used  . Tobacco comment: 2 CIGS A DAY- Stopped smoking when she had positive upt  Substance Use Topics  . Alcohol use: No    Alcohol/week: 0.0 standard drinks    Family History  Problem Relation Age of Onset  . Alcohol abuse Neg Hx   . Arthritis Neg Hx   . Asthma Neg Hx   . Birth defects Neg Hx   . Cancer Neg Hx   . COPD Neg Hx   . Depression Neg Hx   . Diabetes Neg Hx   . Drug abuse Neg Hx   . Early death Neg Hx   . Hearing loss Neg Hx   . Heart disease Neg Hx   . Hyperlipidemia Neg Hx   . Hypertension Neg Hx   . Kidney disease Neg Hx   . Learning disabilities Neg Hx   . Mental illness Neg Hx   . Mental retardation Neg Hx   . Miscarriages / Stillbirths Neg Hx   . Stroke Neg Hx   . Vision loss Neg Hx       Review of Systems  Constitutional: negative for fatigue and weight loss Respiratory: negative for cough and wheezing Cardiovascular: negative for chest pain, fatigue and palpitations Gastrointestinal: negative for abdominal pain and change in bowel habits Musculoskeletal:negative for myalgias Neurological: negative for gait problems and tremors Behavioral/Psych: negative for abusive relationship, depression Endocrine: negative for temperature intolerance    Genitourinary:positive for abnormal menstrual periods Integument/breast: negative for breast lump, breast tenderness, nipple discharge and skin lesion(s)    Objective:       BP (!) 167/113   Pulse 73   Wt 180 lb (81.6 kg)   BMI 29.95 kg/m  General:   alert and no distress  Skin:   no rash or abnormalities  Lungs:   clear to auscultation bilaterally  Heart:   regular rate and rhythm, S1, S2 normal, no murmur, click, rub or gallop  Breasts:   normal without suspicious masses,  skin or nipple changes or axillary nodes  Abdomen:  normal findings: no organomegaly, soft, non-tender and no hernia  Pelvis:  External genitalia: normal general appearance Urinary system: urethral meatus normal and bladder without fullness, nontender Vaginal: normal without tenderness, induration or masses Cervix: normal appearance Adnexa: normal bimanual exam Uterus: anteverted and non-tender, normal size   Lab Review Urine pregnancy test Labs reviewed yes Radiologic studies  reviewed no  50% of 20 min visit spent on counseling and coordination of care.   Assessment:     1. Encounter for routine gynecological examination with Papanicolaou smear of cervix Rx: - Cytology - PAP( [facility])  2. Vaginal discharge Rx: - Cervicovaginal ancillary only( [place])  3. Screen for STD (sexually transmitted disease) Rx: - HIV Antibody (routine testing w rflx) - Hepatitis B surface antigen - RPR - Hepatitis C antibody  4. Dysmenorrhea Rx: - naproxen (EC-NAPROSYN) 500 MG EC tablet; Take 1 tablet (500 mg total) by mouth 2 (two) times daily with a meal.  Dispense: 30 tablet; Refill: 5  5. BV (bacterial vaginosis) Rx: - tinidazole (TINDAMAX) 500 MG tablet; Take 2 tablets (1,000 mg total) by mouth daily with breakfast.  Dispense: 10 tablet; Refill: 2  6. Candida vaginitis Rx: - fluconazole (DIFLUCAN) 200 MG tablet; Take 1 tablet (200 mg total) by mouth every other day.  Dispense: 3 tablet; Refill: 2  7. HTN (hypertension), benign Rx: - triamterene-hydrochlorothiazide (DYAZIDE) 37.5-25 MG capsule; Take 1 each (1 capsule total) by mouth daily before breakfast.  Dispense: 30 capsule; Refill: 11 - carvedilol (COREG) 25 MG tablet; Take 1 tablet (25 mg total) by mouth 2 (two) times daily with a meal.  Dispense: 60 tablet; Refill: 11  8. Seasonal allergic rhinitis due to pollen Rx: - loratadine (CLARITIN) 10 MG tablet; Take 1 tablet (10 mg total) by mouth daily.  Dispense: 30 tablet;  Refill: 11  9. GERD without esophagitis Rx: - omeprazole (PRILOSEC) 20 MG capsule; Take 1 capsule (20 mg total) by mouth 2 (two) times daily before a meal.  Dispense: 60 capsule; Refill: 11  10. Mild intermittent asthma without complication Rx: - albuterol (VENTOLIN HFA) 108 (90 Base) MCG/ACT inhaler; Inhale 2 puffs into the lungs every 6 (six) hours as needed for wheezing.  Dispense: 6.7 g; Refill: 11  11. Tobacco dependence due to cigarettes - cessation with medication and behavioral modification recommended     Plan:    Education reviewed: calcium supplements, depression evaluation, low fat, low cholesterol diet, safe sex/STD prevention, self breast exams, smoking cessation and weight bearing exercise. Follow up in: 1 year.   Meds ordered this encounter  Medications  . triamterene-hydrochlorothiazide (DYAZIDE) 37.5-25 MG capsule    Sig: Take 1 each (1 capsule total) by mouth daily before breakfast.    Dispense:  30 capsule    Refill:  11  . tinidazole (TINDAMAX) 500 MG tablet    Sig: Take 2 tablets (1,000 mg total) by mouth daily with breakfast.    Dispense:  10 tablet    Refill:  2  . carvedilol (COREG) 25 MG tablet    Sig: Take 1 tablet (25 mg total) by mouth 2 (two) times daily with a meal.    Dispense:  60 tablet    Refill:  11  . loratadine (CLARITIN) 10 MG tablet    Sig: Take 1 tablet (10 mg total) by mouth daily.    Dispense:  30 tablet    Refill:  11  . naproxen (EC-NAPROSYN) 500 MG EC tablet    Sig: Take 1 tablet (500 mg total) by mouth 2 (two) times daily with a meal.    Dispense:  30 tablet    Refill:  5  . omeprazole (PRILOSEC) 20 MG capsule    Sig: Take 1 capsule (20 mg total) by mouth 2 (two) times daily before a meal.    Dispense:  60 capsule  Refill:  11  . albuterol (VENTOLIN HFA) 108 (90 Base) MCG/ACT inhaler    Sig: Inhale 2 puffs into the lungs every 6 (six) hours as needed for wheezing.    Dispense:  6.7 g    Refill:  11  . fluconazole  (DIFLUCAN) 200 MG tablet    Sig: Take 1 tablet (200 mg total) by mouth every other day.    Dispense:  3 tablet    Refill:  2   Orders Placed This Encounter  Procedures  . HIV Antibody (routine testing w rflx)  . Hepatitis B surface antigen  . RPR  . Hepatitis C antibody    Brock Bad, MD 11/20/2019 11:05 AM

## 2019-11-21 ENCOUNTER — Other Ambulatory Visit: Payer: Self-pay | Admitting: Obstetrics

## 2019-11-21 LAB — CERVICOVAGINAL ANCILLARY ONLY
Bacterial Vaginitis (gardnerella): POSITIVE — AB
Candida Glabrata: NEGATIVE
Candida Vaginitis: NEGATIVE
Chlamydia: NEGATIVE
Comment: NEGATIVE
Comment: NEGATIVE
Comment: NEGATIVE
Comment: NEGATIVE
Comment: NEGATIVE
Comment: NORMAL
Neisseria Gonorrhea: NEGATIVE
Trichomonas: NEGATIVE

## 2019-11-21 LAB — RPR: RPR Ser Ql: NONREACTIVE

## 2019-11-21 LAB — HEPATITIS B SURFACE ANTIGEN: Hepatitis B Surface Ag: NEGATIVE

## 2019-11-21 LAB — HIV ANTIBODY (ROUTINE TESTING W REFLEX): HIV Screen 4th Generation wRfx: NONREACTIVE

## 2019-11-21 LAB — HEPATITIS C ANTIBODY: Hep C Virus Ab: <0.1 s/co ratio (ref 0.0–0.9)

## 2019-11-22 LAB — CYTOLOGY - PAP
Comment: NEGATIVE
Diagnosis: NEGATIVE
High risk HPV: NEGATIVE

## 2019-11-28 ENCOUNTER — Other Ambulatory Visit: Payer: Self-pay | Admitting: Obstetrics

## 2019-11-28 DIAGNOSIS — I10 Essential (primary) hypertension: Secondary | ICD-10-CM

## 2019-12-02 ENCOUNTER — Other Ambulatory Visit: Payer: Self-pay | Admitting: Obstetrics

## 2019-12-02 DIAGNOSIS — J4520 Mild intermittent asthma, uncomplicated: Secondary | ICD-10-CM

## 2019-12-02 MED ORDER — ALBUTEROL SULFATE HFA 108 (90 BASE) MCG/ACT IN AERS: 2.0000 | INHALATION_SPRAY | Freq: Four times a day (QID) | RESPIRATORY_TRACT | 5 refills | Status: DC | PRN

## 2020-02-20 ENCOUNTER — Other Ambulatory Visit: Payer: Self-pay | Admitting: Obstetrics

## 2020-02-20 DIAGNOSIS — B3731 Acute candidiasis of vulva and vagina: Secondary | ICD-10-CM

## 2020-02-20 DIAGNOSIS — N760 Acute vaginitis: Secondary | ICD-10-CM

## 2020-02-20 DIAGNOSIS — B9689 Other specified bacterial agents as the cause of diseases classified elsewhere: Secondary | ICD-10-CM

## 2020-02-20 DIAGNOSIS — B373 Candidiasis of vulva and vagina: Secondary | ICD-10-CM

## 2020-03-25 ENCOUNTER — Other Ambulatory Visit: Payer: Self-pay | Admitting: Obstetrics

## 2020-03-25 DIAGNOSIS — N760 Acute vaginitis: Secondary | ICD-10-CM

## 2020-03-25 DIAGNOSIS — B9689 Other specified bacterial agents as the cause of diseases classified elsewhere: Secondary | ICD-10-CM

## 2020-03-25 DIAGNOSIS — B373 Candidiasis of vulva and vagina: Secondary | ICD-10-CM

## 2020-03-25 DIAGNOSIS — B3731 Acute candidiasis of vulva and vagina: Secondary | ICD-10-CM

## 2020-04-14 ENCOUNTER — Other Ambulatory Visit: Payer: Self-pay | Admitting: Obstetrics

## 2020-04-14 DIAGNOSIS — B9689 Other specified bacterial agents as the cause of diseases classified elsewhere: Secondary | ICD-10-CM

## 2020-04-14 DIAGNOSIS — B3731 Acute candidiasis of vulva and vagina: Secondary | ICD-10-CM

## 2020-04-14 DIAGNOSIS — B373 Candidiasis of vulva and vagina: Secondary | ICD-10-CM

## 2020-04-28 ENCOUNTER — Other Ambulatory Visit: Payer: Self-pay | Admitting: Obstetrics

## 2020-04-28 DIAGNOSIS — N760 Acute vaginitis: Secondary | ICD-10-CM

## 2020-04-28 DIAGNOSIS — B9689 Other specified bacterial agents as the cause of diseases classified elsewhere: Secondary | ICD-10-CM

## 2020-04-28 DIAGNOSIS — B373 Candidiasis of vulva and vagina: Secondary | ICD-10-CM

## 2020-04-28 DIAGNOSIS — B3731 Acute candidiasis of vulva and vagina: Secondary | ICD-10-CM

## 2020-05-13 ENCOUNTER — Other Ambulatory Visit: Payer: Self-pay | Admitting: Obstetrics

## 2020-05-13 DIAGNOSIS — N760 Acute vaginitis: Secondary | ICD-10-CM

## 2020-05-13 DIAGNOSIS — B3731 Acute candidiasis of vulva and vagina: Secondary | ICD-10-CM

## 2020-05-13 DIAGNOSIS — B373 Candidiasis of vulva and vagina: Secondary | ICD-10-CM

## 2020-05-13 DIAGNOSIS — B9689 Other specified bacterial agents as the cause of diseases classified elsewhere: Secondary | ICD-10-CM

## 2020-07-01 ENCOUNTER — Other Ambulatory Visit: Payer: Self-pay | Admitting: Obstetrics

## 2020-07-01 DIAGNOSIS — B373 Candidiasis of vulva and vagina: Secondary | ICD-10-CM

## 2020-07-01 DIAGNOSIS — B9689 Other specified bacterial agents as the cause of diseases classified elsewhere: Secondary | ICD-10-CM

## 2020-07-01 DIAGNOSIS — B3731 Acute candidiasis of vulva and vagina: Secondary | ICD-10-CM

## 2020-07-23 ENCOUNTER — Other Ambulatory Visit: Payer: Self-pay | Admitting: Obstetrics

## 2020-07-23 DIAGNOSIS — B9689 Other specified bacterial agents as the cause of diseases classified elsewhere: Secondary | ICD-10-CM

## 2020-07-23 DIAGNOSIS — B3731 Acute candidiasis of vulva and vagina: Secondary | ICD-10-CM

## 2020-07-23 DIAGNOSIS — B373 Candidiasis of vulva and vagina: Secondary | ICD-10-CM

## 2020-09-01 ENCOUNTER — Other Ambulatory Visit: Payer: Self-pay | Admitting: Obstetrics

## 2020-09-01 DIAGNOSIS — B373 Candidiasis of vulva and vagina: Secondary | ICD-10-CM

## 2020-09-01 DIAGNOSIS — B9689 Other specified bacterial agents as the cause of diseases classified elsewhere: Secondary | ICD-10-CM

## 2020-09-01 DIAGNOSIS — N760 Acute vaginitis: Secondary | ICD-10-CM

## 2020-09-01 DIAGNOSIS — B3731 Acute candidiasis of vulva and vagina: Secondary | ICD-10-CM

## 2020-09-28 ENCOUNTER — Other Ambulatory Visit: Payer: Self-pay | Admitting: Obstetrics

## 2020-09-28 DIAGNOSIS — B3731 Acute candidiasis of vulva and vagina: Secondary | ICD-10-CM

## 2020-09-28 DIAGNOSIS — N760 Acute vaginitis: Secondary | ICD-10-CM

## 2020-09-28 DIAGNOSIS — B373 Candidiasis of vulva and vagina: Secondary | ICD-10-CM

## 2020-09-28 DIAGNOSIS — B9689 Other specified bacterial agents as the cause of diseases classified elsewhere: Secondary | ICD-10-CM

## 2020-11-05 ENCOUNTER — Other Ambulatory Visit: Payer: Self-pay | Admitting: Obstetrics

## 2020-11-05 DIAGNOSIS — B373 Candidiasis of vulva and vagina: Secondary | ICD-10-CM

## 2020-11-05 DIAGNOSIS — N760 Acute vaginitis: Secondary | ICD-10-CM

## 2020-11-05 DIAGNOSIS — B9689 Other specified bacterial agents as the cause of diseases classified elsewhere: Secondary | ICD-10-CM

## 2020-11-05 DIAGNOSIS — B3731 Acute candidiasis of vulva and vagina: Secondary | ICD-10-CM

## 2020-11-24 ENCOUNTER — Ambulatory Visit (INDEPENDENT_AMBULATORY_CARE_PROVIDER_SITE_OTHER): Payer: Medicare Other | Admitting: Obstetrics

## 2020-11-24 ENCOUNTER — Other Ambulatory Visit (HOSPITAL_COMMUNITY)
Admission: RE | Admit: 2020-11-24 | Discharge: 2020-11-24 | Disposition: A | Discharge status: Home / Self Care | Payer: Medicare Other | Source: Ambulatory Visit | Attending: Obstetrics | Admitting: Obstetrics

## 2020-11-24 ENCOUNTER — Other Ambulatory Visit: Payer: Self-pay

## 2020-11-24 ENCOUNTER — Encounter: Payer: Self-pay | Admitting: Obstetrics

## 2020-11-24 VITALS — BP 136/87 | HR 76 | Ht 65.0 in | Wt 193.0 lb

## 2020-11-24 DIAGNOSIS — Z124 Encounter for screening for malignant neoplasm of cervix: Secondary | ICD-10-CM | POA: Diagnosis not present

## 2020-11-24 DIAGNOSIS — F17210 Nicotine dependence, cigarettes, uncomplicated: Secondary | ICD-10-CM

## 2020-11-24 DIAGNOSIS — Z1151 Encounter for screening for human papillomavirus (HPV): Secondary | ICD-10-CM | POA: Insufficient documentation

## 2020-11-24 DIAGNOSIS — Z113 Encounter for screening for infections with a predominantly sexual mode of transmission: Secondary | ICD-10-CM

## 2020-11-24 DIAGNOSIS — Z01419 Encounter for gynecological examination (general) (routine) without abnormal findings: Secondary | ICD-10-CM | POA: Insufficient documentation

## 2020-11-24 DIAGNOSIS — J4520 Mild intermittent asthma, uncomplicated: Secondary | ICD-10-CM

## 2020-11-24 DIAGNOSIS — I10 Essential (primary) hypertension: Secondary | ICD-10-CM | POA: Diagnosis not present

## 2020-11-24 DIAGNOSIS — N898 Other specified noninflammatory disorders of vagina: Secondary | ICD-10-CM

## 2020-11-24 DIAGNOSIS — J301 Allergic rhinitis due to pollen: Secondary | ICD-10-CM

## 2020-11-24 MED ORDER — LEVOCETIRIZINE DIHYDROCHLORIDE 5 MG PO TABS: 5.0000 mg | ORAL_TABLET | Freq: Every evening | ORAL | 30 refills | Status: DC

## 2020-11-24 NOTE — Progress Notes (Signed)
Subjective:        Stacy [name] is a 35 y.o. female here for a routine exam.  Current complaints: Seasonal allergies.  Also has vaginal discharge.  Personal health questionnaire:  Is patient Ashkenazi Jewish, have a family history of breast and/or ovarian cancer: no Is there a family history of uterine cancer diagnosed at age < 4, gastrointestinal cancer, urinary tract cancer, family member who is a Personnel officer syndrome-associated carrier: no Is the patient overweight and hypertensive, family history of diabetes, personal history of gestational diabetes, preeclampsia or PCOS: no Is patient over 63, have PCOS,  family history of premature CHD under age 77, diabetes, smoke, have hypertension or peripheral artery disease:  no At any time, has a partner hit, kicked or otherwise hurt or frightened you?: no Over the past 2 weeks, have you felt down, depressed or hopeless?: no Over the past 2 weeks, have you felt little interest or pleasure in doing things?:no   Gynecologic History No LMP recorded. Patient has had an ablation. Contraception: tubal ligation Last Pap: 11-20-19. Results were: normal Last mammogram: n/a. Results were: n/a  Obstetric History OB History  Gravida Para Term Preterm AB Living  4 3 3   1 3   SAB IAB Ectopic Multiple Live Births    1     3    # Outcome Date GA Lbr Len/2nd Weight Sex Delivery Anes PTL Lv  4 Term 07/01/13 [redacted]w[redacted]d 20:50 / 00:12 6 lb 3.5 oz (2.82 kg) M Vag-Spont EPI  LIV     Birth Comments: extra digit on left hand  3 Term 10/29/09 [redacted]w[redacted]d  7 lb 3 oz (3.26 kg) F Vag-Spont EPI  LIV  2 Term 01/02/06 [redacted]w[redacted]d  7 lb 2 oz (3.232 kg) F Vag-Spont EPI N LIV  1 IAB             Past Medical History:  Diagnosis Date  . Anxiety   . Arthritis    rheumatoid  . Asthma   . Depression   . Diabetes mellitus without complication (HCC)   . Hypertension   . Medical history non-contributory   . UTI (urinary tract infection)   . Uveitis     Past Surgical History:   Procedure Laterality Date  . CHOLECYSTECTOMY    . CYSTECTOMY    . DILITATION & CURRETTAGE/HYSTROSCOPY WITH HYDROTHERMAL ABLATION N/A 08/21/2015   Procedure: DILATATION & CURETTAGE/HYSTEROSCOPY WITH HYDROTHERMAL ABLATION;  Surgeon: 10/19/2015, MD;  Location: [facility];  Service: Gynecology;  Laterality: N/A;  . EYE SURGERY    . PILONIDAL CYST EXCISION    . TUBAL LIGATION N/A 07/01/2013   Procedure: POST PARTUM TUBAL LIGATION;  Surgeon: 07/03/2013, MD;  Location: [facility];  Service: Gynecology;  Laterality: N/A;  . TUBAL LIGATION    . TUBAL LIGATION N/A   . WISDOM TOOTH EXTRACTION       Current Outpatient Medications:  .  albuterol (VENTOLIN HFA) 108 (90 Base) MCG/ACT inhaler, Inhale 2 puffs into the lungs every 6 (six) hours as needed for wheezing., Disp: 6.7 g, Rfl: 11 .  fluticasone (FLONASE) 50 MCG/ACT nasal spray, Place into both nostrils daily., Disp: , Rfl:  .  hydrochlorothiazide (HYDRODIURIL) 12.5 MG tablet, Take 12.5 mg by mouth daily., Disp: , Rfl:  .  ibuprofen (ADVIL,MOTRIN) 800 MG tablet, Take 1 tablet (800 mg total) by mouth every 8 (eight) hours as needed., Disp: 30 tablet, Rfl: 5 .  levocetirizine (XYZAL) 5 MG tablet, Take 1 tablet (  5 mg total) by mouth every evening., Disp: 30 tablet, Rfl: 30 .  loratadine (CLARITIN) 10 MG tablet, Take 1 tablet (10 mg total) by mouth daily., Disp: 30 tablet, Rfl: 11 .  naproxen (EC-NAPROSYN) 500 MG EC tablet, Take 1 tablet (500 mg total) by mouth 2 (two) times daily with a meal., Disp: 30 tablet, Rfl: 5 .  omeprazole (PRILOSEC) 20 MG capsule, Take 1 capsule (20 mg total) by mouth 2 (two) times daily before a meal., Disp: 60 capsule, Rfl: 11 .  PROAIR HFA 108 (90 Base) MCG/ACT inhaler, INHALE 2 PUFFS BY MOUTH EVERY 6 HOURS AS NEEDED FOR WHEEZING AND FOR SHORTNESS OF BREATH, Disp: 9 g, Rfl: 0 .  albuterol (VENTOLIN HFA) 108 (90 Base) MCG/ACT inhaler, Inhale 2 puffs into the lungs every 6 (six) hours as needed for wheezing or shortness of  breath., Disp: 8 g, Rfl: 5 .  amitriptyline (ELAVIL) 25 MG tablet, Take 25 mg by mouth at bedtime. (Patient not taking: Reported on 11/24/2020), Disp: , Rfl:  Allergies  Allergen Reactions  . Celebrex [Celecoxib] Hives    Social History   Tobacco Use  . Smoking status: Current Every Day Smoker    Packs/day: 0.20    Types: Cigarettes  . Smokeless tobacco: Never Used  . Tobacco comment: 2 CIGS A DAY- Stopped smoking when she had positive upt  Substance Use Topics  . Alcohol use: No    Alcohol/week: 0.0 [name] drinks    Family History  Problem Relation Age of Onset  . Alcohol abuse Neg Hx   . Arthritis Neg Hx   . Asthma Neg Hx   . Birth defects Neg Hx   . Cancer Neg Hx   . COPD Neg Hx   . Depression Neg Hx   . Diabetes Neg Hx   . Drug abuse Neg Hx   . Early death Neg Hx   . Hearing loss Neg Hx   . Heart disease Neg Hx   . Hyperlipidemia Neg Hx   . Hypertension Neg Hx   . Kidney disease Neg Hx   . Learning disabilities Neg Hx   . Mental illness Neg Hx   . Mental retardation Neg Hx   . Miscarriages / Stillbirths Neg Hx   . Stroke Neg Hx   . Vision loss Neg Hx       Review of Systems  Constitutional: negative for fatigue and weight loss Respiratory: negative for cough and wheezing Cardiovascular: negative for chest pain, fatigue and palpitations Gastrointestinal: negative for abdominal pain and change in bowel habits Musculoskeletal:negative for myalgias Neurological: negative for gait problems and tremors Behavioral/Psych: negative for abusive relationship, depression Endocrine: negative for temperature intolerance    Genitourinary: positive for vaginal discharge.  negative for abnormal menstrual periods, genital lesions, hot flashes, sexual problems  Integument/breast: negative for breast lump, breast tenderness, nipple discharge and skin lesion(s)    Objective:       BP 136/87   Pulse 76   Ht 5\' 5"  (1.651 m)   Wt 193 lb (87.5 kg)   BMI 32.12 kg/m   General:   alert and no distress  Skin:   no rash or abnormalities  Lungs:   clear to auscultation bilaterally  Heart:   regular rate and rhythm, S1, S2 normal, no murmur, click, rub or gallop  Breasts:   normal without suspicious masses, skin or nipple changes or axillary nodes  Abdomen:  normal findings: no organomegaly, soft, non-tender and no hernia  Pelvis:  External genitalia: normal general appearance Urinary system: urethral meatus normal and bladder without fullness, nontender Vaginal: normal without tenderness, induration or masses Cervix: normal appearance Adnexa: normal bimanual exam Uterus: anteverted and non-tender, normal size   Lab Review Urine pregnancy test Labs reviewed yes Radiologic studies reviewed no  I have spent a total of 20 minutes of face-to-face time, excluding clinical staff time, reviewing notes and preparing to see patient, ordering tests and/or medications, and counseling the patient.  Assessment:     1. Encounter for routine gynecological examination with Papanicolaou smear of cervix Rx: - Cytology - PAP( [place])  2. Vaginal discharge Rx: - Cervicovaginal ancillary only( [place])  3. Screen for STD (sexually transmitted disease) Rx: - HIV Antibody (routine testing w rflx) - Hepatitis B surface antigen - RPR - Hepatitis C antibody  4. HTN (hypertension), benign - clinically stable.  Managed by PCP  5. Mild intermittent asthma without complication - clinically stable  6. Seasonal allergic rhinitis due to pollen Rx: - levocetirizine (XYZAL) 5 MG tablet; Take 1 tablet (5 mg total) by mouth every evening.  Dispense: 30 tablet; Refill: 30  7. Tobacco dependence due to cigarettes - cessation recommended with the aid of medication and behavioral modification   Plan:    Education reviewed: calcium supplements, depression evaluation and low fat, low cholesterol diet. Follow up in: 1 year.   Meds ordered this encounter   Medications  . levocetirizine (XYZAL) 5 MG tablet    Sig: Take 1 tablet (5 mg total) by mouth every evening.    Dispense:  30 tablet    Refill:  30   Orders Placed This Encounter  Procedures  . HIV Antibody (routine testing w rflx)  . Hepatitis B surface antigen  . RPR  . Hepatitis C antibody    Brock Bad, MD 11/24/2020 9:14 AM

## 2020-11-25 LAB — CYTOLOGY - PAP
Comment: NEGATIVE
Diagnosis: NEGATIVE
High risk HPV: NEGATIVE

## 2020-11-25 LAB — CERVICOVAGINAL ANCILLARY ONLY
Bacterial Vaginitis (gardnerella): NEGATIVE
Candida Glabrata: NEGATIVE
Candida Vaginitis: NEGATIVE
Chlamydia: NEGATIVE
Comment: NEGATIVE
Comment: NEGATIVE
Comment: NEGATIVE
Comment: NEGATIVE
Comment: NEGATIVE
Comment: NORMAL
Neisseria Gonorrhea: NEGATIVE
Trichomonas: NEGATIVE

## 2020-11-25 LAB — HEPATITIS C ANTIBODY: Hep C Virus Ab: <0.1 s/co ratio (ref 0.0–0.9)

## 2020-11-25 LAB — HEPATITIS B SURFACE ANTIGEN: Hepatitis B Surface Ag: NEGATIVE

## 2020-11-25 LAB — HIV ANTIBODY (ROUTINE TESTING W REFLEX): HIV Screen 4th Generation wRfx: NONREACTIVE

## 2020-11-25 LAB — RPR: RPR Ser Ql: NONREACTIVE

## 2020-12-02 ENCOUNTER — Other Ambulatory Visit: Payer: Self-pay | Admitting: Obstetrics

## 2020-12-02 DIAGNOSIS — B373 Candidiasis of vulva and vagina: Secondary | ICD-10-CM

## 2020-12-02 DIAGNOSIS — B9689 Other specified bacterial agents as the cause of diseases classified elsewhere: Secondary | ICD-10-CM

## 2020-12-02 DIAGNOSIS — N760 Acute vaginitis: Secondary | ICD-10-CM

## 2020-12-02 DIAGNOSIS — B3731 Acute candidiasis of vulva and vagina: Secondary | ICD-10-CM

## 2020-12-26 ENCOUNTER — Other Ambulatory Visit: Payer: Self-pay | Admitting: Obstetrics

## 2021-02-26 ENCOUNTER — Other Ambulatory Visit: Payer: Self-pay | Admitting: Obstetrics

## 2021-02-26 DIAGNOSIS — B9689 Other specified bacterial agents as the cause of diseases classified elsewhere: Secondary | ICD-10-CM

## 2021-02-26 DIAGNOSIS — N760 Acute vaginitis: Secondary | ICD-10-CM

## 2021-02-26 DIAGNOSIS — B373 Candidiasis of vulva and vagina: Secondary | ICD-10-CM

## 2021-02-26 DIAGNOSIS — B3731 Acute candidiasis of vulva and vagina: Secondary | ICD-10-CM

## 2021-03-09 ENCOUNTER — Other Ambulatory Visit: Payer: Self-pay | Admitting: Obstetrics

## 2021-03-09 DIAGNOSIS — B373 Candidiasis of vulva and vagina: Secondary | ICD-10-CM

## 2021-03-09 DIAGNOSIS — B3731 Acute candidiasis of vulva and vagina: Secondary | ICD-10-CM

## 2021-03-09 DIAGNOSIS — B9689 Other specified bacterial agents as the cause of diseases classified elsewhere: Secondary | ICD-10-CM

## 2021-03-20 ENCOUNTER — Other Ambulatory Visit: Payer: Self-pay | Admitting: Obstetrics

## 2021-03-20 DIAGNOSIS — N760 Acute vaginitis: Secondary | ICD-10-CM

## 2021-03-20 DIAGNOSIS — B9689 Other specified bacterial agents as the cause of diseases classified elsewhere: Secondary | ICD-10-CM

## 2021-04-15 ENCOUNTER — Other Ambulatory Visit: Payer: Self-pay | Admitting: Obstetrics

## 2021-04-15 DIAGNOSIS — B3731 Acute candidiasis of vulva and vagina: Secondary | ICD-10-CM

## 2021-04-15 DIAGNOSIS — N760 Acute vaginitis: Secondary | ICD-10-CM

## 2021-04-15 DIAGNOSIS — B373 Candidiasis of vulva and vagina: Secondary | ICD-10-CM

## 2021-04-15 DIAGNOSIS — B9689 Other specified bacterial agents as the cause of diseases classified elsewhere: Secondary | ICD-10-CM

## 2021-05-14 ENCOUNTER — Other Ambulatory Visit: Payer: Self-pay | Admitting: Obstetrics

## 2021-05-14 DIAGNOSIS — B3731 Acute candidiasis of vulva and vagina: Secondary | ICD-10-CM

## 2021-05-14 DIAGNOSIS — B9689 Other specified bacterial agents as the cause of diseases classified elsewhere: Secondary | ICD-10-CM

## 2021-06-07 ENCOUNTER — Other Ambulatory Visit: Payer: Self-pay | Admitting: Obstetrics

## 2021-06-07 DIAGNOSIS — B3731 Acute candidiasis of vulva and vagina: Secondary | ICD-10-CM

## 2021-06-22 ENCOUNTER — Other Ambulatory Visit: Payer: Self-pay | Admitting: Obstetrics

## 2021-06-22 DIAGNOSIS — B3731 Acute candidiasis of vulva and vagina: Secondary | ICD-10-CM

## 2021-07-14 ENCOUNTER — Other Ambulatory Visit: Payer: Self-pay | Admitting: Obstetrics

## 2021-07-14 DIAGNOSIS — B3731 Acute candidiasis of vulva and vagina: Secondary | ICD-10-CM

## 2021-07-14 DIAGNOSIS — B9689 Other specified bacterial agents as the cause of diseases classified elsewhere: Secondary | ICD-10-CM

## 2021-07-22 ENCOUNTER — Other Ambulatory Visit: Payer: Self-pay | Admitting: Obstetrics

## 2021-07-22 DIAGNOSIS — N760 Acute vaginitis: Secondary | ICD-10-CM

## 2021-07-22 DIAGNOSIS — B9689 Other specified bacterial agents as the cause of diseases classified elsewhere: Secondary | ICD-10-CM

## 2021-07-22 DIAGNOSIS — B3731 Acute candidiasis of vulva and vagina: Secondary | ICD-10-CM

## 2021-08-10 ENCOUNTER — Other Ambulatory Visit: Payer: Self-pay | Admitting: Obstetrics

## 2021-08-10 DIAGNOSIS — B3731 Acute candidiasis of vulva and vagina: Secondary | ICD-10-CM

## 2021-08-10 DIAGNOSIS — B9689 Other specified bacterial agents as the cause of diseases classified elsewhere: Secondary | ICD-10-CM

## 2021-09-26 ENCOUNTER — Other Ambulatory Visit: Payer: Self-pay | Admitting: Obstetrics

## 2021-09-26 DIAGNOSIS — N760 Acute vaginitis: Secondary | ICD-10-CM

## 2021-09-26 DIAGNOSIS — B3731 Acute candidiasis of vulva and vagina: Secondary | ICD-10-CM

## 2021-09-26 DIAGNOSIS — B9689 Other specified bacterial agents as the cause of diseases classified elsewhere: Secondary | ICD-10-CM

## 2021-10-31 ENCOUNTER — Other Ambulatory Visit: Payer: Self-pay | Admitting: Obstetrics

## 2021-10-31 DIAGNOSIS — B3731 Acute candidiasis of vulva and vagina: Secondary | ICD-10-CM

## 2021-10-31 DIAGNOSIS — B9689 Other specified bacterial agents as the cause of diseases classified elsewhere: Secondary | ICD-10-CM

## 2021-11-13 ENCOUNTER — Other Ambulatory Visit: Payer: Self-pay | Admitting: Obstetrics

## 2021-11-13 DIAGNOSIS — N760 Acute vaginitis: Secondary | ICD-10-CM

## 2021-11-13 DIAGNOSIS — B3731 Acute candidiasis of vulva and vagina: Secondary | ICD-10-CM

## 2021-12-07 ENCOUNTER — Other Ambulatory Visit: Payer: Self-pay | Admitting: Obstetrics

## 2021-12-07 DIAGNOSIS — B9689 Other specified bacterial agents as the cause of diseases classified elsewhere: Secondary | ICD-10-CM

## 2021-12-07 DIAGNOSIS — B3731 Acute candidiasis of vulva and vagina: Secondary | ICD-10-CM

## 2021-12-17 ENCOUNTER — Other Ambulatory Visit: Payer: Self-pay | Admitting: Obstetrics

## 2021-12-17 DIAGNOSIS — B3731 Acute candidiasis of vulva and vagina: Secondary | ICD-10-CM

## 2021-12-17 DIAGNOSIS — N760 Acute vaginitis: Secondary | ICD-10-CM

## 2021-12-28 ENCOUNTER — Other Ambulatory Visit: Payer: Self-pay | Admitting: Obstetrics

## 2021-12-28 DIAGNOSIS — B3731 Acute candidiasis of vulva and vagina: Secondary | ICD-10-CM

## 2021-12-28 DIAGNOSIS — J301 Allergic rhinitis due to pollen: Secondary | ICD-10-CM

## 2021-12-28 DIAGNOSIS — B9689 Other specified bacterial agents as the cause of diseases classified elsewhere: Secondary | ICD-10-CM

## 2022-01-05 ENCOUNTER — Other Ambulatory Visit: Payer: Self-pay | Admitting: Obstetrics

## 2022-01-24 ENCOUNTER — Other Ambulatory Visit: Payer: Self-pay | Admitting: Obstetrics

## 2022-01-24 DIAGNOSIS — B3731 Acute candidiasis of vulva and vagina: Secondary | ICD-10-CM

## 2022-03-04 ENCOUNTER — Other Ambulatory Visit: Payer: Self-pay | Admitting: Obstetrics

## 2022-03-04 DIAGNOSIS — B3731 Acute candidiasis of vulva and vagina: Secondary | ICD-10-CM

## 2022-03-22 ENCOUNTER — Other Ambulatory Visit: Payer: Self-pay | Admitting: Obstetrics

## 2022-03-22 DIAGNOSIS — B9689 Other specified bacterial agents as the cause of diseases classified elsewhere: Secondary | ICD-10-CM

## 2022-04-03 ENCOUNTER — Other Ambulatory Visit: Payer: Self-pay | Admitting: Obstetrics

## 2022-04-03 DIAGNOSIS — B9689 Other specified bacterial agents as the cause of diseases classified elsewhere: Secondary | ICD-10-CM

## 2022-04-15 ENCOUNTER — Other Ambulatory Visit: Payer: Self-pay | Admitting: Obstetrics

## 2022-04-15 DIAGNOSIS — B9689 Other specified bacterial agents as the cause of diseases classified elsewhere: Secondary | ICD-10-CM

## 2022-04-18 NOTE — Telephone Encounter (Signed)
Pt overdue for annual exam. RX refill refused

## 2022-04-24 ENCOUNTER — Other Ambulatory Visit: Payer: Self-pay | Admitting: Obstetrics

## 2022-04-24 DIAGNOSIS — B9689 Other specified bacterial agents as the cause of diseases classified elsewhere: Secondary | ICD-10-CM

## 2022-06-02 ENCOUNTER — Other Ambulatory Visit: Payer: Self-pay | Admitting: Obstetrics

## 2022-06-02 DIAGNOSIS — B3731 Acute candidiasis of vulva and vagina: Secondary | ICD-10-CM

## 2022-06-02 DIAGNOSIS — N760 Acute vaginitis: Secondary | ICD-10-CM

## 2022-07-03 ENCOUNTER — Other Ambulatory Visit: Payer: Self-pay | Admitting: Obstetrics

## 2022-07-03 DIAGNOSIS — B3731 Acute candidiasis of vulva and vagina: Secondary | ICD-10-CM

## 2022-07-03 DIAGNOSIS — N760 Acute vaginitis: Secondary | ICD-10-CM

## 2022-09-08 ENCOUNTER — Other Ambulatory Visit: Payer: Self-pay | Admitting: Obstetrics

## 2022-09-08 DIAGNOSIS — B3731 Acute candidiasis of vulva and vagina: Secondary | ICD-10-CM

## 2022-09-08 DIAGNOSIS — N760 Acute vaginitis: Secondary | ICD-10-CM

## 2022-11-15 ENCOUNTER — Other Ambulatory Visit: Payer: Self-pay | Admitting: Obstetrics

## 2022-11-15 DIAGNOSIS — J301 Allergic rhinitis due to pollen: Secondary | ICD-10-CM

## 2023-01-16 ENCOUNTER — Other Ambulatory Visit: Payer: Self-pay | Admitting: Obstetrics

## 2023-01-16 DIAGNOSIS — N760 Acute vaginitis: Secondary | ICD-10-CM

## 2023-01-16 DIAGNOSIS — B3731 Acute candidiasis of vulva and vagina: Secondary | ICD-10-CM

## 2023-01-24 NOTE — Telephone Encounter (Signed)
Needs appt.- has not been seen since 2021

## 2023-01-29 ENCOUNTER — Other Ambulatory Visit: Payer: Self-pay | Admitting: Obstetrics

## 2023-01-29 DIAGNOSIS — B3731 Acute candidiasis of vulva and vagina: Secondary | ICD-10-CM

## 2023-01-29 DIAGNOSIS — B9689 Other specified bacterial agents as the cause of diseases classified elsewhere: Secondary | ICD-10-CM

## 2023-03-02 ENCOUNTER — Other Ambulatory Visit: Payer: Self-pay | Admitting: Family Medicine

## 2023-03-02 DIAGNOSIS — N760 Acute vaginitis: Secondary | ICD-10-CM

## 2023-03-02 DIAGNOSIS — B3731 Acute candidiasis of vulva and vagina: Secondary | ICD-10-CM

## 2023-05-27 ENCOUNTER — Other Ambulatory Visit: Payer: Self-pay | Admitting: Family Medicine

## 2023-05-27 DIAGNOSIS — B3731 Acute candidiasis of vulva and vagina: Secondary | ICD-10-CM

## 2023-05-27 DIAGNOSIS — B9689 Other specified bacterial agents as the cause of diseases classified elsewhere: Secondary | ICD-10-CM

## 2023-06-27 ENCOUNTER — Ambulatory Visit: Payer: 59 | Admitting: Obstetrics

## 2023-06-27 ENCOUNTER — Encounter: Payer: Self-pay | Admitting: Obstetrics

## 2023-06-27 ENCOUNTER — Other Ambulatory Visit (HOSPITAL_COMMUNITY)
Admission: RE | Admit: 2023-06-27 | Discharge: 2023-06-27 | Disposition: A | Discharge status: Home / Self Care | Payer: 59 | Source: Ambulatory Visit | Attending: Obstetrics | Admitting: Obstetrics

## 2023-06-27 VITALS — BP 156/104 | HR 88 | Ht 65.0 in | Wt 201.9 lb

## 2023-06-27 DIAGNOSIS — N898 Other specified noninflammatory disorders of vagina: Secondary | ICD-10-CM | POA: Insufficient documentation

## 2023-06-27 DIAGNOSIS — Z113 Encounter for screening for infections with a predominantly sexual mode of transmission: Secondary | ICD-10-CM | POA: Diagnosis not present

## 2023-06-27 DIAGNOSIS — D508 Other iron deficiency anemias: Secondary | ICD-10-CM

## 2023-06-27 DIAGNOSIS — E66811 Obesity, class 1: Secondary | ICD-10-CM

## 2023-06-27 DIAGNOSIS — N946 Dysmenorrhea, unspecified: Secondary | ICD-10-CM | POA: Diagnosis not present

## 2023-06-27 MED ORDER — FLUCONAZOLE 200 MG PO TABS: 200.0000 mg | ORAL_TABLET | ORAL | 2 refills | Status: DC

## 2023-06-27 MED ORDER — IBUPROFEN 800 MG PO TABS: 800.0000 mg | ORAL_TABLET | Freq: Three times a day (TID) | ORAL | 5 refills | Status: DC | PRN

## 2023-06-27 MED ORDER — TINIDAZOLE 500 MG PO TABS: 1000.0000 mg | ORAL_TABLET | Freq: Every day | ORAL | 2 refills | Status: DC

## 2023-06-27 NOTE — Progress Notes (Signed)
Patient ID: Stacy [name], female   DOB: Jul 25, 1985, 37 y.o.   MRN: [id number]  Chief Complaint  Patient presents with   Vaginal Discharge    Itching, odor, discharge    HPI Stacy [name] is a 36 y.o. female.  Complains of vaginal discharge with itching and odor.  Requests STD screening. HPI  Past Medical History:  Diagnosis Date   Anxiety    Arthritis    rheumatoid   Asthma    Depression    Diabetes mellitus without complication (HCC)    Hypertension    Medical history non-contributory    UTI (urinary tract infection)    Uveitis     Past Surgical History:  Procedure Laterality Date   CHOLECYSTECTOMY     CYSTECTOMY     DILITATION & CURRETTAGE/HYSTROSCOPY WITH HYDROTHERMAL ABLATION N/A 08/21/2015   Procedure: DILATATION & CURETTAGE/HYSTEROSCOPY WITH HYDROTHERMAL ABLATION;  Surgeon: Brock Bad, MD;  Location: [facility];  Service: Gynecology;  Laterality: N/A;   EYE SURGERY     PILONIDAL CYST EXCISION     TUBAL LIGATION N/A 07/01/2013   Procedure: POST PARTUM TUBAL LIGATION;  Surgeon: Allie Bossier, MD;  Location: [facility];  Service: Gynecology;  Laterality: N/A;   TUBAL LIGATION     TUBAL LIGATION N/A    WISDOM TOOTH EXTRACTION      Family History  Problem Relation Age of Onset   Alcohol abuse Neg Hx    Arthritis Neg Hx    Asthma Neg Hx    Birth defects Neg Hx    Cancer Neg Hx    COPD Neg Hx    Depression Neg Hx    Diabetes Neg Hx    Drug abuse Neg Hx    Early death Neg Hx    Hearing loss Neg Hx    Heart disease Neg Hx    Hyperlipidemia Neg Hx    Hypertension Neg Hx    Kidney disease Neg Hx    Learning disabilities Neg Hx    Mental illness Neg Hx    Mental retardation Neg Hx    Miscarriages / Stillbirths Neg Hx    Stroke Neg Hx    Vision loss Neg Hx     Social History Social History   Tobacco Use   Smoking status: Every Day    Current packs/day: 0.20    Types: Cigarettes   Smokeless tobacco: Never   Tobacco comments:    2 CIGS A DAY- Stopped  smoking when she had positive upt  Substance Use Topics   Alcohol use: No    Alcohol/week: 0.0 standard drinks of alcohol   Drug use: No    Allergies  Allergen Reactions   Cefdinir     Dizziness, fainting   Celebrex [Celecoxib] Hives    Current Outpatient Medications  Medication Sig Dispense Refill   albuterol (VENTOLIN HFA) 108 (90 Base) MCG/ACT inhaler INHALE 2 PUFFS BY MOUTH EVERY 6 HOURS AS NEEDED FOR WHEEZING AND FOR SHORTNESS OF BREATH 9 g 11   fluconazole (DIFLUCAN) 200 MG tablet TAKE 1 TABLET BY MOUTH EVERY OTHER DAY 3 tablet 0   fluticasone (FLONASE) 50 MCG/ACT nasal spray Place into both nostrils daily.     loratadine (CLARITIN) 10 MG tablet Take 1 tablet (10 mg total) by mouth daily. 30 tablet 11   tinidazole (TINDAMAX) 500 MG tablet TAKE 2 TABLETS BY MOUTH ONCE DAILY WITH BREAKFAST 10 tablet 0   amitriptyline (ELAVIL) 25 MG tablet Take 25 mg by mouth  at bedtime. (Patient not taking: Reported on 11/24/2020)     hydrochlorothiazide (HYDRODIURIL) 12.5 MG tablet Take 12.5 mg by mouth daily.     ibuprofen (ADVIL,MOTRIN) 800 MG tablet Take 1 tablet (800 mg total) by mouth every 8 (eight) hours as needed. 30 tablet 5   levocetirizine (XYZAL) 5 MG tablet TAKE 1 TABLET BY MOUTH ONCE DAILY IN THE EVENING 30 tablet 11   naproxen (EC-NAPROSYN) 500 MG EC tablet Take 1 tablet (500 mg total) by mouth 2 (two) times daily with a meal. 30 tablet 5   omeprazole (PRILOSEC) 20 MG capsule Take 1 capsule (20 mg total) by mouth 2 (two) times daily before a meal. (Patient not taking: Reported on 06/27/2023) 60 capsule 11   No current facility-administered medications for this visit.    Review of Systems Review of Systems Constitutional: negative for fatigue and weight loss Respiratory: negative for cough and wheezing Cardiovascular: negative for chest pain, fatigue and palpitations Gastrointestinal: negative for abdominal pain and change in bowel habits Genitourinary: positive for vaginal  discharge with itching and odor Integument/breast: negative for nipple discharge Musculoskeletal:negative for myalgias Neurological: negative for gait problems and tremors Behavioral/Psych: negative for abusive relationship, depression Endocrine: negative for temperature intolerance      Blood pressure (!) 156/104, pulse 88, height 5\' 5"  (1.651 m), weight 201 lb 14.4 oz (91.6 kg).  Physical Exam Physical Exam General:   Alert and no distress  Skin:   no rash or abnormalities  Lungs:   clear to auscultation bilaterally  Heart:   regular rate and rhythm, S1, S2 normal, no murmur, click, rub or gallop  Breasts:   normal without suspicious masses, skin or nipple changes or axillary nodes  Abdomen:  normal findings: no organomegaly, soft, non-tender and no hernia  Pelvis:  External genitalia: normal general appearance Urinary system: urethral meatus normal and bladder without fullness, nontender Vaginal: normal without tenderness, induration or masses Cervix: normal appearance Adnexa: normal bimanual exam Uterus: anteverted and non-tender, normal size   I have spent a total of 30 minutes of face-to-face time, excluding clinical staff time, reviewing notes and preparing to see patient, ordering tests and/or medications, and counseling the patient.   Data Reviewed WET PREP AND CULTURES  Assessment     1. Vaginal discharge Rx: - Cervicovaginal ancillary only( [place]) - fluconazole (DIFLUCAN) 200 MG tablet; Take 1 tablet (200 mg total) by mouth every 3 (three) days.  Dispense: 3 tablet; Refill: 2 - tinidazole (TINDAMAX) 500 MG tablet; Take 2 tablets (1,000 mg total) by mouth daily with breakfast.  Dispense: 10 tablet; Refill: 2  2. Screen for STD (sexually transmitted disease) Rx: - HIV antibody (with reflex) - RPR  3. Dysmenorrhea Rx: - ibuprofen (ADVIL) 800 MG tablet; Take 1 tablet (800 mg total) by mouth every 8 (eight) hours as needed.  Dispense: 30 tablet; Refill: 5  4.  Iron deficiency anemia secondary to inadequate dietary iron intake Rx: - CBC  5. Obesity (BMI 30.0-34.9) - weight reduction recommended with the aid of dietary changes, exercise and behavioral modification     Plan  Follow up in 3 months for Annual.   Orders Placed This Encounter  Procedures   CBC   HIV antibody (with reflex)   RPR    Brock Bad, MD, FACOG Attending Obstetrician & Gynecologist, [facility] [employer], [facility]

## 2023-06-27 NOTE — Progress Notes (Signed)
Pt. Presents for vaginal itching, odor, and discharge. Wants to be tested for all std's

## 2023-06-28 LAB — CBC
Hematocrit: 36.4 % (ref 34.0–46.6)
Hemoglobin: 12.1 g/dL (ref 11.1–15.9)
MCH: 30.6 pg (ref 26.6–33.0)
MCHC: 33.2 g/dL (ref 31.5–35.7)
MCV: 92 fL (ref 79–97)
Platelets: 281 10*3/uL (ref 150–450)
RBC: 3.95 x10E6/uL (ref 3.77–5.28)
RDW: 13.6 % (ref 11.7–15.4)
WBC: 11 10*3/uL — ABNORMAL HIGH (ref 3.4–10.8)

## 2023-06-28 LAB — CERVICOVAGINAL ANCILLARY ONLY
Bacterial Vaginitis (gardnerella): POSITIVE — AB
Candida Glabrata: NEGATIVE
Candida Vaginitis: NEGATIVE
Chlamydia: NEGATIVE
Comment: NEGATIVE
Comment: NEGATIVE
Comment: NEGATIVE
Comment: NEGATIVE
Comment: NEGATIVE
Comment: NORMAL
Neisseria Gonorrhea: NEGATIVE
Trichomonas: NEGATIVE

## 2023-06-28 LAB — HIV ANTIBODY (ROUTINE TESTING W REFLEX): HIV Screen 4th Generation wRfx: NONREACTIVE

## 2023-06-28 LAB — RPR: RPR Ser Ql: NONREACTIVE

## 2023-11-01 ENCOUNTER — Other Ambulatory Visit: Payer: Self-pay | Admitting: Obstetrics

## 2023-11-01 DIAGNOSIS — N898 Other specified noninflammatory disorders of vagina: Secondary | ICD-10-CM

## 2023-12-08 ENCOUNTER — Encounter: Payer: Self-pay | Admitting: Obstetrics

## 2023-12-08 ENCOUNTER — Other Ambulatory Visit (HOSPITAL_COMMUNITY)
Admission: RE | Admit: 2023-12-08 | Discharge: 2023-12-08 | Disposition: A | Discharge status: Home / Self Care | Source: Ambulatory Visit | Attending: Obstetrics | Admitting: Obstetrics

## 2023-12-08 ENCOUNTER — Ambulatory Visit: Admitting: Obstetrics

## 2023-12-08 VITALS — BP 152/100 | HR 83 | Ht 64.0 in | Wt 194.0 lb

## 2023-12-08 DIAGNOSIS — Z113 Encounter for screening for infections with a predominantly sexual mode of transmission: Secondary | ICD-10-CM | POA: Diagnosis not present

## 2023-12-08 DIAGNOSIS — Z01411 Encounter for gynecological examination (general) (routine) with abnormal findings: Secondary | ICD-10-CM | POA: Diagnosis present

## 2023-12-08 DIAGNOSIS — Z1331 Encounter for screening for depression: Secondary | ICD-10-CM

## 2023-12-08 DIAGNOSIS — Z01419 Encounter for gynecological examination (general) (routine) without abnormal findings: Secondary | ICD-10-CM

## 2023-12-08 DIAGNOSIS — E66811 Obesity, class 1: Secondary | ICD-10-CM

## 2023-12-08 DIAGNOSIS — N898 Other specified noninflammatory disorders of vagina: Secondary | ICD-10-CM

## 2023-12-08 DIAGNOSIS — Z720 Tobacco use: Secondary | ICD-10-CM | POA: Diagnosis not present

## 2023-12-08 DIAGNOSIS — Z3202 Encounter for pregnancy test, result negative: Secondary | ICD-10-CM | POA: Diagnosis not present

## 2023-12-08 DIAGNOSIS — Z1151 Encounter for screening for human papillomavirus (HPV): Secondary | ICD-10-CM | POA: Diagnosis not present

## 2023-12-08 DIAGNOSIS — N643 Galactorrhea not associated with childbirth: Secondary | ICD-10-CM

## 2023-12-08 LAB — POCT URINE PREGNANCY: Preg Test, Ur: NEGATIVE

## 2023-12-08 NOTE — Progress Notes (Signed)
 Pt presents for annual. Pt would like all std testing, and pregnancy test. No other questions or concerns at this time

## 2023-12-08 NOTE — Progress Notes (Signed)
 Subjective:        Stacy [name] is a 38 y.o. female here for a routine exam.  Current complaints: Vaginal discharge.    Personal health questionnaire:  Is patient Ashkenazi Jewish, have a family history of breast and/or ovarian cancer: no Is there a family history of uterine cancer diagnosed at age < 34, gastrointestinal cancer, urinary tract cancer, family member who is a Personnel officer syndrome-associated carrier: no Is the patient overweight and hypertensive, family history of diabetes, personal history of gestational diabetes, preeclampsia or PCOS: no Is patient over 12, have PCOS,  family history of premature CHD under age 56, diabetes, smoke, have hypertension or peripheral artery disease:  no At any time, has a partner hit, kicked or otherwise hurt or frightened you?: no Over the past 2 weeks, have you felt down, depressed or hopeless?: no Over the past 2 weeks, have you felt little interest or pleasure in doing things?:no   Gynecologic History No LMP recorded. Patient has had an ablation. Contraception: tubal ligation Last Pap: 2022. Results were: normal Last mammogram: NONE. Results were: NONE  Obstetric History OB History  Gravida Para Term Preterm AB Living  4 3 3  1 3   SAB IAB Ectopic Multiple Live Births   1   3    # Outcome Date GA Lbr Len/2nd Weight Sex Type Anes PTL Lv  4 Term 07/01/13 [redacted]w[redacted]d 20:50 / 00:12 6 lb 3.5 oz (2.82 kg) M Vag-Spont EPI  LIV     Birth Comments: extra digit on left hand  3 Term 10/29/09 [redacted]w[redacted]d  7 lb 3 oz (3.26 kg) F Vag-Spont EPI  LIV  2 Term 01/02/06 [redacted]w[redacted]d  7 lb 2 oz (3.232 kg) F Vag-Spont EPI N LIV  1 IAB             Past Medical History:  Diagnosis Date   Anxiety    Arthritis    rheumatoid   Asthma    Depression    Diabetes mellitus without complication (HCC)    Hypertension    Medical history non-contributory    UTI (urinary tract infection)    Uveitis     Past Surgical History:  Procedure Laterality Date   CHOLECYSTECTOMY      CYSTECTOMY     DILITATION & CURRETTAGE/HYSTROSCOPY WITH HYDROTHERMAL ABLATION N/A 08/21/2015   Procedure: DILATATION & CURETTAGE/HYSTEROSCOPY WITH HYDROTHERMAL ABLATION;  Surgeon: Gabrielle Joiner, MD;  Location: [facility];  Service: Gynecology;  Laterality: N/A;   EYE SURGERY     PILONIDAL CYST EXCISION     TUBAL LIGATION N/A 07/01/2013   Procedure: POST PARTUM TUBAL LIGATION;  Surgeon: Ana Balling, MD;  Location: [facility];  Service: Gynecology;  Laterality: N/A;   TUBAL LIGATION     TUBAL LIGATION N/A    WISDOM TOOTH EXTRACTION       Current Outpatient Medications:    albuterol  (VENTOLIN  HFA) 108 (90 Base) MCG/ACT inhaler, INHALE 2 PUFFS BY MOUTH EVERY 6 HOURS AS NEEDED FOR WHEEZING AND FOR SHORTNESS OF BREATH, Disp: 9 g, Rfl: 11   fluconazole  (DIFLUCAN ) 200 MG tablet, TAKE 1 TABLET BY MOUTH EVERY 72 HOURS (EVERY 3 DAYS), Disp: 3 tablet, Rfl: 2   fluticasone (FLONASE) 50 MCG/ACT nasal spray, Place into both nostrils daily., Disp: , Rfl:    levocetirizine (XYZAL ) 5 MG tablet, TAKE 1 TABLET BY MOUTH ONCE DAILY IN THE EVENING, Disp: 30 tablet, Rfl: 11   loratadine  (CLARITIN ) 10 MG tablet, Take 1 tablet (10 mg  total) by mouth daily., Disp: 30 tablet, Rfl: 11   tinidazole  (TINDAMAX ) 500 MG tablet, TAKE 2 TABLETS BY MOUTH ONCE DAILY WITH BREAKFAST, Disp: 10 tablet, Rfl: 2   amitriptyline (ELAVIL) 25 MG tablet, Take 25 mg by mouth at bedtime. (Patient not taking: Reported on 11/24/2020), Disp: , Rfl:    fluconazole  (DIFLUCAN ) 200 MG tablet, TAKE 1 TABLET BY MOUTH EVERY OTHER DAY (Patient not taking: Reported on 12/08/2023), Disp: 3 tablet, Rfl: 0   hydrochlorothiazide (HYDRODIURIL) 12.5 MG tablet, Take 12.5 mg by mouth daily., Disp: , Rfl:    ibuprofen  (ADVIL ) 800 MG tablet, Take 1 tablet (800 mg total) by mouth every 8 (eight) hours as needed. (Patient not taking: Reported on 12/08/2023), Disp: 30 tablet, Rfl: 5   ibuprofen  (ADVIL ,MOTRIN ) 800 MG tablet, Take 1 tablet (800 mg total) by mouth every 8  (eight) hours as needed., Disp: 30 tablet, Rfl: 5   naproxen  (EC-NAPROSYN ) 500 MG EC tablet, Take 1 tablet (500 mg total) by mouth 2 (two) times daily with a meal., Disp: 30 tablet, Rfl: 5   omeprazole  (PRILOSEC) 20 MG capsule, Take 1 capsule (20 mg total) by mouth 2 (two) times daily before a meal. (Patient not taking: Reported on 12/08/2023), Disp: 60 capsule, Rfl: 11 Allergies  Allergen Reactions   Cefdinir     Dizziness, fainting   Celebrex [Celecoxib] Hives    Social History   Tobacco Use   Smoking status: Every Day    Current packs/day: 0.20    Types: Cigarettes   Smokeless tobacco: Never   Tobacco comments:    2 CIGS A DAY- Stopped smoking when she had positive upt  Substance Use Topics   Alcohol use: No    Alcohol/week: 0.0 [name] drinks of alcohol    Family History  Problem Relation Age of Onset   Alcohol abuse Neg Hx    Arthritis Neg Hx    Asthma Neg Hx    Birth defects Neg Hx    Cancer Neg Hx    COPD Neg Hx    Depression Neg Hx    Diabetes Neg Hx    Drug abuse Neg Hx    Early death Neg Hx    Hearing loss Neg Hx    Heart disease Neg Hx    Hyperlipidemia Neg Hx    Hypertension Neg Hx    Kidney disease Neg Hx    Learning disabilities Neg Hx    Mental illness Neg Hx    Mental retardation Neg Hx    Miscarriages / Stillbirths Neg Hx    Stroke Neg Hx    Vision loss Neg Hx       Review of Systems  Constitutional: negative for fatigue and weight loss Respiratory: negative for cough and wheezing Cardiovascular: negative for chest pain, fatigue and palpitations Gastrointestinal: negative for abdominal pain and change in bowel habits Musculoskeletal:negative for myalgias Neurological: negative for gait problems and tremors Behavioral/Psych: negative for abusive relationship, depression Endocrine: negative for temperature intolerance    Genitourinary: positive for vaginal discharge.  negative for abnormal menstrual periods, genital lesions, hot flashes, sexual  problems  Integument/breast: positive for milky nipple discharge.  negative for breast lump, breast tenderness, and skin lesion(s)    Objective:       BP (!) 152/100   Pulse 83   Ht 5\' 4"  (1.626 m)   Wt 194 lb (88 kg)   BMI 33.30 kg/m  General:   Alert and no distress  Skin:   no  rash or abnormalities  Lungs:   clear to auscultation bilaterally  Heart:   regular rate and rhythm, S1, S2 normal, no murmur, click, rub or gallop  Breasts:   normal without suspicious masses, skin or nipple changes or axillary nodes  Abdomen:  normal findings: no organomegaly, soft, non-tender and no hernia  Pelvis:  External genitalia: normal general appearance Urinary system: urethral meatus normal and bladder without fullness, nontender Vaginal: normal without tenderness, induration or masses Cervix: normal appearance Adnexa: normal bimanual exam Uterus: anteverted and non-tender, normal size   Lab Review Urine pregnancy test Labs reviewed yes Radiologic studies reviewed no  I have spent a total of 20 minutes of face-to-face time, excluding clinical staff time, reviewing notes and preparing to see patient, ordering tests and/or medications, and counseling the patient.   Assessment:    1. Encounter for gynecological examination with Papanicolaou smear of cervix (Primary) Rx: - Cytology - PAP( [place]) - POCT urine pregnancy  2. Vaginal discharge Rx: - Cervicovaginal ancillary only( [place])  3. Screen for STD (sexually transmitted disease) Rx: - HIV antibody (with reflex) - RPR - Hepatitis C Antibody - Hepatitis B Surface AntiGEN  4. Tobacco abuse - cessation recommended  5. Obesity (BMI 30.0-34.9) - weight reduction with the aid of dietary changes, exercise and behavioral modification recommended  6. 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 Follow up in: 2 weeks.    Orders Placed This Encounter  Procedures   HIV antibody (with reflex)   RPR   Hepatitis C Antibody   Hepatitis B Surface AntiGEN   Prolactin   TSH   POCT urine pregnancy     Gabrielle Joiner, MD, FACOG Attending Obstetrician & Gynecologist, [facility] for [facility], [facility], [place] 12/08/2023

## 2023-12-12 ENCOUNTER — Other Ambulatory Visit

## 2023-12-12 LAB — CERVICOVAGINAL ANCILLARY ONLY
Bacterial Vaginitis (gardnerella): NEGATIVE
Candida Glabrata: NEGATIVE
Candida Vaginitis: NEGATIVE
Chlamydia: NEGATIVE
Comment: NEGATIVE
Comment: NEGATIVE
Comment: NEGATIVE
Comment: NEGATIVE
Comment: NEGATIVE
Comment: NORMAL
Neisseria Gonorrhea: NEGATIVE
Trichomonas: NEGATIVE

## 2023-12-13 ENCOUNTER — Ambulatory Visit: Payer: Self-pay | Admitting: Family Medicine

## 2023-12-13 LAB — PROLACTIN: Prolactin: 18.6 ng/mL (ref 4.8–33.4)

## 2023-12-13 LAB — HIV ANTIBODY (ROUTINE TESTING W REFLEX): HIV Screen 4th Generation wRfx: NONREACTIVE

## 2023-12-13 LAB — HEPATITIS C ANTIBODY: Hep C Virus Ab: NONREACTIVE

## 2023-12-13 LAB — TSH: TSH: 1.46 u[IU]/mL (ref 0.450–4.500)

## 2023-12-13 LAB — RPR: RPR Ser Ql: NONREACTIVE

## 2023-12-13 LAB — HEPATITIS B SURFACE ANTIGEN: Hepatitis B Surface Ag: NEGATIVE

## 2023-12-14 LAB — CYTOLOGY - PAP
Comment: NEGATIVE
Diagnosis: NEGATIVE
High risk HPV: NEGATIVE

## 2024-02-08 ENCOUNTER — Other Ambulatory Visit: Payer: Self-pay | Admitting: Obstetrics

## 2024-02-08 DIAGNOSIS — N898 Other specified noninflammatory disorders of vagina: Secondary | ICD-10-CM

## 2024-03-20 ENCOUNTER — Other Ambulatory Visit: Payer: Self-pay | Admitting: Obstetrics

## 2024-03-20 ENCOUNTER — Other Ambulatory Visit: Payer: Self-pay | Admitting: Family Medicine

## 2024-03-20 DIAGNOSIS — N898 Other specified noninflammatory disorders of vagina: Secondary | ICD-10-CM

## 2024-03-20 DIAGNOSIS — N946 Dysmenorrhea, unspecified: Secondary | ICD-10-CM

## 2024-04-02 ENCOUNTER — Other Ambulatory Visit: Payer: Self-pay

## 2024-04-02 DIAGNOSIS — N898 Other specified noninflammatory disorders of vagina: Secondary | ICD-10-CM

## 2024-04-02 MED ORDER — TINIDAZOLE 500 MG PO TABS: 2.0000 g | ORAL_TABLET | Freq: Every day | ORAL | 0 refills | Status: DC

## 2024-06-02 ENCOUNTER — Other Ambulatory Visit: Payer: Self-pay | Admitting: Obstetrics

## 2024-06-03 ENCOUNTER — Other Ambulatory Visit: Payer: Self-pay

## 2024-06-03 MED ORDER — TINIDAZOLE 500 MG PO TABS: 2.0000 g | ORAL_TABLET | Freq: Every day | ORAL | 0 refills | Status: DC

## 2024-07-07 ENCOUNTER — Other Ambulatory Visit: Payer: Self-pay | Admitting: Obstetrics
